# Patient Record
Sex: Female | Born: 1945
Health system: Southern US, Community
[De-identification: ages and names within clinical notes are randomized; demographics above are authoritative.]

## PROBLEM LIST (undated history)

## (undated) DIAGNOSIS — G20 Parkinson's disease: Secondary | ICD-10-CM

## (undated) DIAGNOSIS — M1990 Unspecified osteoarthritis, unspecified site: Secondary | ICD-10-CM

## (undated) DIAGNOSIS — G20A1 Parkinson's disease without dyskinesia, without mention of fluctuations: Secondary | ICD-10-CM

## (undated) DIAGNOSIS — I10 Essential (primary) hypertension: Secondary | ICD-10-CM

## (undated) DIAGNOSIS — C801 Malignant (primary) neoplasm, unspecified: Secondary | ICD-10-CM

## (undated) HISTORY — DX: Parkinson's disease: G20

## (undated) HISTORY — PX: OVARIAN CYST REMOVAL: SHX89

## (undated) HISTORY — DX: Parkinson's disease without dyskinesia, without mention of fluctuations: G20.A1

## (undated) HISTORY — DX: Essential (primary) hypertension: I10

## (undated) HISTORY — PX: OTHER SURGICAL HISTORY: SHX169

## (undated) HISTORY — DX: Unspecified osteoarthritis, unspecified site: M19.90

## (undated) HISTORY — DX: Malignant (primary) neoplasm, unspecified: C80.1

## (undated) HISTORY — PX: ABDOMINAL HYSTERECTOMY: SHX81

## (undated) HISTORY — PX: FOOT SURGERY: SHX648

---

## 2015-09-18 DIAGNOSIS — Z79899 Other long term (current) drug therapy: Secondary | ICD-10-CM | POA: Diagnosis not present

## 2015-10-26 DIAGNOSIS — M1711 Unilateral primary osteoarthritis, right knee: Secondary | ICD-10-CM | POA: Diagnosis not present

## 2015-10-26 DIAGNOSIS — Z09 Encounter for follow-up examination after completed treatment for conditions other than malignant neoplasm: Secondary | ICD-10-CM | POA: Diagnosis not present

## 2015-10-26 DIAGNOSIS — M0579 Rheumatoid arthritis with rheumatoid factor of multiple sites without organ or systems involvement: Secondary | ICD-10-CM | POA: Diagnosis not present

## 2015-11-10 DIAGNOSIS — H524 Presbyopia: Secondary | ICD-10-CM | POA: Diagnosis not present

## 2015-11-10 DIAGNOSIS — Z79899 Other long term (current) drug therapy: Secondary | ICD-10-CM | POA: Diagnosis not present

## 2015-11-10 DIAGNOSIS — H5203 Hypermetropia, bilateral: Secondary | ICD-10-CM | POA: Diagnosis not present

## 2015-11-10 DIAGNOSIS — H52223 Regular astigmatism, bilateral: Secondary | ICD-10-CM | POA: Diagnosis not present

## 2015-11-10 DIAGNOSIS — H25813 Combined forms of age-related cataract, bilateral: Secondary | ICD-10-CM | POA: Diagnosis not present

## 2015-11-17 HISTORY — PX: SKIN CANCER EXCISION: SHX779

## 2015-11-23 DIAGNOSIS — H6123 Impacted cerumen, bilateral: Secondary | ICD-10-CM | POA: Diagnosis not present

## 2015-11-23 DIAGNOSIS — E785 Hyperlipidemia, unspecified: Secondary | ICD-10-CM | POA: Diagnosis not present

## 2015-11-23 DIAGNOSIS — Z780 Asymptomatic menopausal state: Secondary | ICD-10-CM | POA: Diagnosis not present

## 2015-11-23 DIAGNOSIS — Z1389 Encounter for screening for other disorder: Secondary | ICD-10-CM | POA: Diagnosis not present

## 2015-11-23 DIAGNOSIS — Z1231 Encounter for screening mammogram for malignant neoplasm of breast: Secondary | ICD-10-CM | POA: Diagnosis not present

## 2015-11-23 DIAGNOSIS — Z9181 History of falling: Secondary | ICD-10-CM | POA: Diagnosis not present

## 2015-11-23 DIAGNOSIS — Z0000 Encounter for general adult medical examination without abnormal findings: Secondary | ICD-10-CM | POA: Diagnosis not present

## 2015-11-23 DIAGNOSIS — Z79899 Other long term (current) drug therapy: Secondary | ICD-10-CM | POA: Diagnosis not present

## 2015-11-23 DIAGNOSIS — Z1211 Encounter for screening for malignant neoplasm of colon: Secondary | ICD-10-CM | POA: Diagnosis not present

## 2015-11-23 DIAGNOSIS — C44612 Basal cell carcinoma of skin of right upper limb, including shoulder: Secondary | ICD-10-CM | POA: Diagnosis not present

## 2015-12-07 DIAGNOSIS — M05761 Rheumatoid arthritis with rheumatoid factor of right knee without organ or systems involvement: Secondary | ICD-10-CM | POA: Diagnosis not present

## 2015-12-07 DIAGNOSIS — R251 Tremor, unspecified: Secondary | ICD-10-CM | POA: Diagnosis not present

## 2015-12-07 DIAGNOSIS — E785 Hyperlipidemia, unspecified: Secondary | ICD-10-CM | POA: Diagnosis not present

## 2015-12-07 DIAGNOSIS — N182 Chronic kidney disease, stage 2 (mild): Secondary | ICD-10-CM | POA: Diagnosis not present

## 2015-12-10 DIAGNOSIS — G20 Parkinson's disease: Secondary | ICD-10-CM | POA: Diagnosis not present

## 2015-12-14 DIAGNOSIS — Z79899 Other long term (current) drug therapy: Secondary | ICD-10-CM | POA: Diagnosis not present

## 2015-12-15 DIAGNOSIS — C44612 Basal cell carcinoma of skin of right upper limb, including shoulder: Secondary | ICD-10-CM | POA: Diagnosis not present

## 2015-12-18 DIAGNOSIS — M85861 Other specified disorders of bone density and structure, right lower leg: Secondary | ICD-10-CM | POA: Diagnosis not present

## 2015-12-18 DIAGNOSIS — Z780 Asymptomatic menopausal state: Secondary | ICD-10-CM | POA: Diagnosis not present

## 2015-12-18 DIAGNOSIS — Z1231 Encounter for screening mammogram for malignant neoplasm of breast: Secondary | ICD-10-CM | POA: Diagnosis not present

## 2015-12-18 DIAGNOSIS — M8580 Other specified disorders of bone density and structure, unspecified site: Secondary | ICD-10-CM | POA: Diagnosis not present

## 2016-03-07 ENCOUNTER — Other Ambulatory Visit: Payer: Self-pay | Admitting: Rheumatology

## 2016-03-07 DIAGNOSIS — Z79899 Other long term (current) drug therapy: Secondary | ICD-10-CM | POA: Diagnosis not present

## 2016-03-08 LAB — CBC WITH DIFFERENTIAL/PLATELET
Basophils Absolute: 0.1 10*3/uL (ref 0.0–0.2)
Basos: 1 %
EOS (ABSOLUTE): 0.1 10*3/uL (ref 0.0–0.4)
EOS: 2 %
HEMATOCRIT: 39.3 % (ref 34.0–46.6)
HEMOGLOBIN: 13.3 g/dL (ref 11.1–15.9)
IMMATURE GRANULOCYTES: 0 %
Immature Grans (Abs): 0 10*3/uL (ref 0.0–0.1)
LYMPHS: 30 %
Lymphocytes Absolute: 2.3 10*3/uL (ref 0.7–3.1)
MCH: 28.5 pg (ref 26.6–33.0)
MCHC: 33.8 g/dL (ref 31.5–35.7)
MCV: 84 fL (ref 79–97)
MONOCYTES: 8 %
Monocytes Absolute: 0.6 10*3/uL (ref 0.1–0.9)
NEUTROS PCT: 59 %
Neutrophils Absolute: 4.6 10*3/uL (ref 1.4–7.0)
Platelets: 248 10*3/uL (ref 150–379)
RBC: 4.67 x10E6/uL (ref 3.77–5.28)
RDW: 13.8 % (ref 12.3–15.4)
WBC: 7.7 10*3/uL (ref 3.4–10.8)

## 2016-03-08 LAB — COMPREHENSIVE METABOLIC PANEL
ALBUMIN: 4 g/dL (ref 3.6–4.8)
ALT: 21 IU/L (ref 0–32)
AST: 27 IU/L (ref 0–40)
Albumin/Globulin Ratio: 1.8 (ref 1.2–2.2)
Alkaline Phosphatase: 97 IU/L (ref 39–117)
BUN / CREAT RATIO: 14 (ref 12–28)
BUN: 10 mg/dL (ref 8–27)
Bilirubin Total: 0.6 mg/dL (ref 0.0–1.2)
CALCIUM: 9.6 mg/dL (ref 8.7–10.3)
CO2: 26 mmol/L (ref 18–29)
CREATININE: 0.72 mg/dL (ref 0.57–1.00)
Chloride: 98 mmol/L (ref 96–106)
GFR calc Af Amer: 99 mL/min/{1.73_m2} (ref >59)
GFR, EST NON AFRICAN AMERICAN: 86 mL/min/{1.73_m2} (ref >59)
GLOBULIN, TOTAL: 2.2 g/dL (ref 1.5–4.5)
Glucose: 97 mg/dL (ref 65–99)
Potassium: 4.6 mmol/L (ref 3.5–5.2)
SODIUM: 138 mmol/L (ref 134–144)
Total Protein: 6.2 g/dL (ref 6.0–8.5)

## 2016-03-09 ENCOUNTER — Telehealth: Payer: Self-pay | Admitting: Radiology

## 2016-03-09 NOTE — Telephone Encounter (Signed)
Call patient/ normal labs

## 2016-03-09 NOTE — Telephone Encounter (Signed)
I have called patient to advise labs are normal  

## 2016-03-17 DIAGNOSIS — R0683 Snoring: Secondary | ICD-10-CM | POA: Diagnosis not present

## 2016-03-17 DIAGNOSIS — G20 Parkinson's disease: Secondary | ICD-10-CM | POA: Diagnosis not present

## 2016-03-24 DIAGNOSIS — M19071 Primary osteoarthritis, right ankle and foot: Secondary | ICD-10-CM | POA: Insufficient documentation

## 2016-03-24 DIAGNOSIS — Z79899 Other long term (current) drug therapy: Secondary | ICD-10-CM | POA: Insufficient documentation

## 2016-03-24 DIAGNOSIS — M19042 Primary osteoarthritis, left hand: Secondary | ICD-10-CM | POA: Insufficient documentation

## 2016-03-24 DIAGNOSIS — M19072 Primary osteoarthritis, left ankle and foot: Secondary | ICD-10-CM

## 2016-03-24 DIAGNOSIS — M170 Bilateral primary osteoarthritis of knee: Secondary | ICD-10-CM | POA: Insufficient documentation

## 2016-03-24 DIAGNOSIS — M19041 Primary osteoarthritis, right hand: Secondary | ICD-10-CM | POA: Insufficient documentation

## 2016-03-24 DIAGNOSIS — M0579 Rheumatoid arthritis with rheumatoid factor of multiple sites without organ or systems involvement: Secondary | ICD-10-CM | POA: Insufficient documentation

## 2016-03-24 DIAGNOSIS — R251 Tremor, unspecified: Secondary | ICD-10-CM | POA: Insufficient documentation

## 2016-03-24 NOTE — Progress Notes (Signed)
Office Visit Note  Patient: Dawn Harrell             Date of Birth: [birth year]           MRN: [id number]             PCP: Charletta Cousin, MD Referring: No ref. provider found Visit Date: 03/28/2016 Occupation: @GUAROCC @    Subjective:  No chief complaint on file. Follow-up on rheumatoid arthritis and Plaquenil  History of Present Illness: Dawn Harrell is a 70 y.o. female  Last seen 10/26/2015. Patient is doing really well with her rheumatoid arthritis. She's taking her Plaquenil as prescribed at 200 mg 3 times a day Monday through Friday. Her last Plaquenil eye exam was normal approximately July 2017 in her next one will be due July 2018.  She had a rash on her right forearm close to her elbow area. She saw a dermatologist, Dr. Jimmye Norman, in [facility]. They did diagnose her with cancer of the skin but patient does not recall what type of cancer was. She states that it was not a melanoma. Large surgical excision was done.  Patient is doing well. The surgical scar is approximately 7-10 cm long. It has healed well. We do not have records of the exact diagnosis and the procedure that was done. I've asked the patient to get [place] a copy.  Patient has ongoing OA of the hands. Most painful is her left second DIP joint. It is angulated I offered her ring splint and patient is agreeable. I've written a prescription and she will follow-up with the physical therapist.  Activities of Daily Living:  Patient reports morning stiffness for 15 minutes.   Patient Denies nocturnal pain.  Difficulty dressing/grooming: Denies Difficulty climbing stairs: Denies Difficulty getting out of chair: Denies Difficulty using hands for taps, buttons, cutlery, and/or writing: Denies   No Rheumatology ROS completed.   PMFS History:  Patient Active Problem List   Diagnosis Date Noted  . Tremor 03/24/2016  . Rheumatoid arthritis involving multiple sites with positive rheumatoid factor  ([place]) 03/24/2016  . Primary osteoarthritis of both hands 03/24/2016  . Primary osteoarthritis of both knees 03/24/2016  . Primary osteoarthritis of both feet 03/24/2016  . High risk medication use 03/24/2016    Past Medical History:  Diagnosis Date  . Parkinson's disease ([place])     Family History  Problem Relation Age of Onset  . Stroke Mother   . Heart attack Father   . Rheum arthritis Sister   . Leukemia Brother   . Diabetes Maternal Grandmother   . Diabetes Paternal Grandmother    Past Surgical History:  Procedure Laterality Date  . SKIN CANCER EXCISION  11/2015   Social History   Social History Narrative  . No narrative on file     Objective: Vital Signs: BP 126/87 (BP Location: Left Arm, Patient Position: Sitting, Cuff Size: Large)   Pulse 74   Resp 14   Ht 5\' 1"  (1.549 m)   Wt 258 lb (117 kg)   BMI 48.75 kg/m    Physical Exam   Musculoskeletal Exam:  Full range of motion of all joints Grip strength is equal and strong bilaterally Fibromyalgia tender points are all absent  CDAI Exam: No CDAI exam completed.  No synovitis  Investigation: Findings:  Normal PLQ eye exam 11/10/15   Orders Only on 03/07/2016  Component Date Value Ref Range Status  . WBC 03/08/2016 7.7  3.4 - 10.8 x10E3/uL Final  . RBC  03/08/2016 4.67  3.77 - 5.28 x10E6/uL Final  . Hemoglobin 03/08/2016 13.3  11.1 - 15.9 g/dL Final   Comment: **Effective March 21, 2016 the reference interval**   for Hemoglobin MALES only will be changing to:                         Males 13-15 years: 12.6 - 17.7                         Males   >15 years: 13.0 - 17.7   . Hematocrit 03/08/2016 39.3  34.0 - 46.6 % Final  . MCV 03/08/2016 84  79 - 97 fL Final  . MCH 03/08/2016 28.5  26.6 - 33.0 pg Final  . MCHC 03/08/2016 33.8  31.5 - 35.7 g/dL Final  . RDW 03/08/2016 13.8  12.3 - 15.4 % Final  . Platelets 03/08/2016 248  150 - 379 x10E3/uL Final  . Neutrophils 03/08/2016 59  Not Estab. % Final  .  Lymphs 03/08/2016 30  Not Estab. % Final  . Monocytes 03/08/2016 8  Not Estab. % Final  . Eos 03/08/2016 2  Not Estab. % Final  . Basos 03/08/2016 1  Not Estab. % Final  . Neutrophils Absolute 03/08/2016 4.6  1.4 - 7.0 x10E3/uL Final  . Lymphocytes Absolute 03/08/2016 2.3  0.7 - 3.1 x10E3/uL Final  . Monocytes Absolute 03/08/2016 0.6  0.1 - 0.9 x10E3/uL Final  . EOS (ABSOLUTE) 03/08/2016 0.1  0.0 - 0.4 x10E3/uL Final  . Basophils Absolute 03/08/2016 0.1  0.0 - 0.2 x10E3/uL Final  . Immature Granulocytes 03/08/2016 0  Not Estab. % Final  . Immature Grans (Abs) 03/08/2016 0.0  0.0 - 0.1 x10E3/uL Final  . Glucose 03/08/2016 97  65 - 99 mg/dL Final  . BUN 03/08/2016 10  8 - 27 mg/dL Final  . Creatinine, Ser 03/08/2016 0.72  0.57 - 1.00 mg/dL Final  . GFR calc non Af Amer 03/08/2016 86  >59 mL/min/1.73 Final  . GFR calc Af Amer 03/08/2016 99  >59 mL/min/1.73 Final  . BUN/Creatinine Ratio 03/08/2016 14  12 - 28 Final  . Sodium 03/08/2016 138  134 - 144 mmol/L Final  . Potassium 03/08/2016 4.6  3.5 - 5.2 mmol/L Final  . Chloride 03/08/2016 98  96 - 106 mmol/L Final  . CO2 03/08/2016 26  18 - 29 mmol/L Final  . Calcium 03/08/2016 9.6  8.7 - 10.3 mg/dL Final  . Total Protein 03/08/2016 6.2  6.0 - 8.5 g/dL Final  . Albumin 03/08/2016 4.0  3.6 - 4.8 g/dL Final  . Globulin, Total 03/08/2016 2.2  1.5 - 4.5 g/dL Final  . Albumin/Globulin Ratio 03/08/2016 1.8  1.2 - 2.2 Final  . Bilirubin Total 03/08/2016 0.6  0.0 - 1.2 mg/dL Final  . Alkaline Phosphatase 03/08/2016 97  39 - 117 IU/L Final  . AST 03/08/2016 27  0 - 40 IU/L Final  . ALT 03/08/2016 21  0 - 32 IU/L Final    Imaging: No results found.  Speciality Comments: No specialty comments available.    Procedures:  No procedures performed Allergies: Patient has no known allergies.   Assessment / Plan:     Visit Diagnoses: Rheumatoid arthritis involving multiple sites with positive rheumatoid factor (HCC)  Primary osteoarthritis of  both hands  Primary osteoarthritis of both knees  Primary osteoarthritis of both feet  Tremor  High risk medication use - Plan: CBC with Differential/Platelet,  COMPLETE METABOLIC PANEL WITH GFR   [facility] for evaluation and treatment of left second DIP ring splint  prescription written and given to patient.  Labs from 03/07/2016 that include CBC with differential and CMP at with GFR are normal. Patient has enough Plaquenil at the moment. She just received a refill on her prescription for November 2017. It is okay to give her refill if she calls. She'll be due for Plaquenil eye exam approximately June July 2018. Return to clinic in 5 months and at that time we'll repeat her blood work.  Orders: Orders Placed This Encounter  Procedures  . CBC with Differential/Platelet  . COMPLETE METABOLIC PANEL WITH GFR   No orders of the defined types were placed in this encounter.   Face-to-face time spent with patient was 30 minutes. 50% of time was spent in counseling and coordination of care.  Follow-Up Instructions: Return in about 5 months (around 08/26/2016) for RA,plq 200 bid m-f, oahands,oa feet;.   Eliezer Lofts, PA-C I examined and evaluated the patient with Eliezer Lofts PA. The plan of care was discussed as noted above.  Bo Merino, MD

## 2016-03-28 ENCOUNTER — Ambulatory Visit (INDEPENDENT_AMBULATORY_CARE_PROVIDER_SITE_OTHER): Payer: Commercial Managed Care - HMO | Admitting: Rheumatology

## 2016-03-28 ENCOUNTER — Encounter: Payer: Self-pay | Admitting: Rheumatology

## 2016-03-28 VITALS — BP 126/87 | HR 74 | Resp 14 | Ht 61.0 in | Wt 258.0 lb

## 2016-03-28 DIAGNOSIS — R251 Tremor, unspecified: Secondary | ICD-10-CM | POA: Diagnosis not present

## 2016-03-28 DIAGNOSIS — M19071 Primary osteoarthritis, right ankle and foot: Secondary | ICD-10-CM

## 2016-03-28 DIAGNOSIS — M19042 Primary osteoarthritis, left hand: Secondary | ICD-10-CM | POA: Diagnosis not present

## 2016-03-28 DIAGNOSIS — M19041 Primary osteoarthritis, right hand: Secondary | ICD-10-CM | POA: Diagnosis not present

## 2016-03-28 DIAGNOSIS — M170 Bilateral primary osteoarthritis of knee: Secondary | ICD-10-CM

## 2016-03-28 DIAGNOSIS — M19072 Primary osteoarthritis, left ankle and foot: Secondary | ICD-10-CM | POA: Diagnosis not present

## 2016-03-28 DIAGNOSIS — M0579 Rheumatoid arthritis with rheumatoid factor of multiple sites without organ or systems involvement: Secondary | ICD-10-CM

## 2016-03-28 DIAGNOSIS — Z79899 Other long term (current) drug therapy: Secondary | ICD-10-CM

## 2016-03-28 NOTE — Patient Instructions (Signed)
CC hand surgeon/physical therapy for OA left second DIP angulation and ring splint

## 2016-04-07 DIAGNOSIS — R251 Tremor, unspecified: Secondary | ICD-10-CM | POA: Diagnosis not present

## 2016-04-07 DIAGNOSIS — K644 Residual hemorrhoidal skin tags: Secondary | ICD-10-CM | POA: Diagnosis not present

## 2016-04-07 DIAGNOSIS — E785 Hyperlipidemia, unspecified: Secondary | ICD-10-CM | POA: Diagnosis not present

## 2016-04-07 DIAGNOSIS — M05761 Rheumatoid arthritis with rheumatoid factor of right knee without organ or systems involvement: Secondary | ICD-10-CM | POA: Diagnosis not present

## 2016-04-07 DIAGNOSIS — Z79899 Other long term (current) drug therapy: Secondary | ICD-10-CM | POA: Diagnosis not present

## 2016-04-26 DIAGNOSIS — L570 Actinic keratosis: Secondary | ICD-10-CM | POA: Diagnosis not present

## 2016-08-26 ENCOUNTER — Ambulatory Visit (INDEPENDENT_AMBULATORY_CARE_PROVIDER_SITE_OTHER): Payer: Commercial Managed Care - HMO | Admitting: Rheumatology

## 2016-08-26 ENCOUNTER — Encounter: Payer: Self-pay | Admitting: Rheumatology

## 2016-08-26 VITALS — BP 130/80 | HR 74 | Resp 16 | Ht 61.0 in | Wt 154.0 lb

## 2016-08-26 DIAGNOSIS — M19041 Primary osteoarthritis, right hand: Secondary | ICD-10-CM | POA: Diagnosis not present

## 2016-08-26 DIAGNOSIS — M25561 Pain in right knee: Secondary | ICD-10-CM | POA: Diagnosis not present

## 2016-08-26 DIAGNOSIS — G8929 Other chronic pain: Secondary | ICD-10-CM

## 2016-08-26 DIAGNOSIS — M19042 Primary osteoarthritis, left hand: Secondary | ICD-10-CM

## 2016-08-26 DIAGNOSIS — Z79899 Other long term (current) drug therapy: Secondary | ICD-10-CM | POA: Diagnosis not present

## 2016-08-26 DIAGNOSIS — M170 Bilateral primary osteoarthritis of knee: Secondary | ICD-10-CM | POA: Diagnosis not present

## 2016-08-26 DIAGNOSIS — M0579 Rheumatoid arthritis with rheumatoid factor of multiple sites without organ or systems involvement: Secondary | ICD-10-CM

## 2016-08-26 MED ORDER — TRIAMCINOLONE ACETONIDE 40 MG/ML IJ SUSP
40.0000 mg | INTRAMUSCULAR | Status: AC | PRN
  Administered 2016-08-26: 40 mg via INTRA_ARTICULAR

## 2016-08-26 MED ORDER — LIDOCAINE HCL 1 % IJ SOLN
1.0000 mL | INTRAMUSCULAR | Status: AC | PRN
  Administered 2016-08-26: 1 mL

## 2016-08-26 NOTE — Patient Instructions (Signed)
  Plaquenil eye exam due July 2018 please fax results to [phone]

## 2016-08-26 NOTE — Progress Notes (Signed)
Office Visit Note  Patient: Dawn Harrell             Date of Birth: February 11, 1946           MRN: [id number]             PCP: Melony Overly, MD Referring: Melony Overly, MD Visit Date: 08/26/2016 Occupation: @GUAROCC @    Subjective:  Medication Management   History of Present Illness: Dawn Harrell is a 71 y.o. female  Last seen 03/28/2016  Patient is doing really well with her rheumatoid arthritis. She's taking her Plaquenil as prescribed at 200 mg twice a day Monday through Friday. Her last Plaquenil eye exam was normal approximately July 2017 in her next one will be due July 2018.   Patient has OA of the hands. Most painful is her left second DIP joint. It is angulated I offered her ring splint and patient is agreeable. I've written a prescription and she will follow-up with the physical therapist.   Activities of Daily Living:  Patient reports morning stiffness for 15 minutes.   Patient Denies nocturnal pain.  Difficulty dressing/grooming: Denies Difficulty climbing stairs: Denies Difficulty getting out of chair: Denies Difficulty using hands for taps, buttons, cutlery, and/or writing: Denies   Review of Systems  Constitutional: Negative for fatigue.  HENT: Negative for mouth sores and mouth dryness.   Eyes: Negative for dryness.  Respiratory: Negative for shortness of breath.   Gastrointestinal: Negative for constipation and diarrhea.  Musculoskeletal: Negative for myalgias and myalgias.  Skin: Negative for sensitivity to sunlight.  Psychiatric/Behavioral: Negative for decreased concentration and sleep disturbance.    PMFS History:  Patient Active Problem List   Diagnosis Date Noted  . Tremor 03/24/2016  . Rheumatoid arthritis involving multiple sites with positive rheumatoid factor ([place]) 03/24/2016  . Primary osteoarthritis of both hands 03/24/2016  . Primary osteoarthritis of both knees 03/24/2016  . Primary osteoarthritis of both feet 03/24/2016   . High risk medication use 03/24/2016    Past Medical History:  Diagnosis Date  . Parkinson's disease ([place])   . skin cancer     Family History  Problem Relation Age of Onset  . Stroke Mother   . Heart attack Father   . Rheum arthritis Sister   . Leukemia Brother   . Diabetes Maternal Grandmother   . Diabetes Paternal Grandmother    Past Surgical History:  Procedure Laterality Date  . ABDOMINAL HYSTERECTOMY    . bladder tack    . SKIN CANCER EXCISION  11/2015   Social History   Social History Narrative  . No narrative on file     Objective: Vital Signs: BP 130/80   Pulse 74   Resp 16   Ht 5' 1"  (1.549 m)   Wt 154 lb (69.9 kg)   BMI 29.10 kg/m    Physical Exam  Constitutional: She is oriented to person, place, and time. She appears well-developed and well-nourished.  HENT:  Head: Normocephalic and atraumatic.  Eyes: EOM are normal. Pupils are equal, round, and reactive to light.  Cardiovascular: Normal rate, regular rhythm and normal heart sounds.  Exam reveals no gallop and no friction rub.   No murmur heard. Pulmonary/Chest: Effort normal and breath sounds normal. She has no wheezes. She has no rales.  Abdominal: Soft. Bowel sounds are normal. She exhibits no distension. There is no tenderness. There is no guarding. No hernia.  Musculoskeletal: Normal range of motion. She exhibits no edema, tenderness or deformity.  Lymphadenopathy:    She has no cervical adenopathy.  Neurological: She is alert and oriented to person, place, and time. Coordination normal.  Skin: Skin is warm and dry. Capillary refill takes less than 2 seconds. No rash noted.  Psychiatric: She has a normal mood and affect. Her behavior is normal.  Nursing note and vitals reviewed.    Musculoskeletal Exam:  Full range of motion of all joints Grip strength is equal and strong bilaterally Fibromyalgia tender points are all absent  CDAI Exam: CDAI Homunculus Exam:   Joint Counts:  CDAI  Tender Joint count: 0 CDAI Swollen Joint count: 0   No synovitis on examination  Investigation: No additional findings.    Imaging: No results found.  Speciality Comments: No specialty comments available.    Procedures:  Large Joint Inj Date/Time: 08/26/2016 12:18 PM Performed by: Eliezer Lofts Authorized by: Eliezer Lofts   Consent Given by:  Patient Site marked: the procedure site was marked   Timeout: prior to procedure the correct patient, procedure, and site was verified   Indications:  Pain and joint swelling Location:  Knee Site:  R knee Prep: patient was prepped and draped in usual sterile fashion   Needle Size:  27 G Needle Length:  1.5 inches Approach:  Medial Ultrasound Guidance: No   Fluoroscopic Guidance: No   Arthrogram: No   Medications:  40 mg triamcinolone acetonide 40 MG/ML; 1 mL lidocaine 1 % Aspiration Attempted: Yes   Aspirate amount (mL):  0 Patient tolerance:  Patient tolerated the procedure well with no immediate complications  Right knee with cortisone injection Patient had an injection more than a year ago. Patient is interested in Visco supplementation. She has failed weight loss, she has failed NSAID's (cannot take; failed Tylenol); failed cortisone and x-rays are proven that she has osteoarthritis   Allergies: Gabapentin   Assessment / Plan:     Visit Diagnoses: Rheumatoid arthritis involving multiple sites with positive rheumatoid factor ([place])  High risk medication use - Plan: CBC with Differential/Platelet, COMPLETE METABOLIC PANEL WITH GFR, CBC with Differential/Platelet, CMP14+EGFR, CBC with Differential/Platelet, CMP14+EGFR  Primary osteoarthritis of both knees  Primary osteoarthritis of both hands   Plan: #1: Rheumatoid arthritis. Positive rheumatoid factor. No joint pain, swelling, stiffness.  #2: High risk prescription. Plaquenil 200 mg twice a day Monday through Friday; Plaquenil eye exam was normal July 2017 and  due July 2018  #3: OA of bilateral knees  #4: Right knee pain. Patient is requesting a repeat cortisone injection. She states the last cortisone injection helped. It was given approximately more than a year ago.  We discussed Visco supplementation and patient is agreeable.  #5: I will send the following message to apply for Visco supplementation for bilateral knees.  #6: CBC with differential and CMP with GFR to be done at lab core in about 2 weeks or later. We wanted to draw in the office today but patient has poison ivy exposure to bilateral arms and it would be wise not to draw the blood today due to the risk of transferring and from the skin into the venipuncture site.  Orders: Orders Placed This Encounter  Procedures  . Large Joint Injection/Arthrocentesis  . CBC with Differential/Platelet  . COMPLETE METABOLIC PANEL WITH GFR  . CBC with Differential/Platelet  . CMP14+EGFR   No orders of the defined types were placed in this encounter.   Face-to-face time spent with patient was 30 minutes. 50% of time was spent in counseling and coordination  of care.  Follow-Up Instructions: No Follow-up on file.   Eliezer Lofts, PA-C  Note - This record has been created using [employer].  Chart creation errors have been sought, but may not always  have been located. Such creation errors do not reflect on  the standard of medical care.

## 2016-09-15 DIAGNOSIS — G20 Parkinson's disease: Secondary | ICD-10-CM | POA: Diagnosis not present

## 2016-09-19 DIAGNOSIS — Z79899 Other long term (current) drug therapy: Secondary | ICD-10-CM | POA: Diagnosis not present

## 2016-09-20 LAB — CMP14+EGFR
A/G RATIO: 2 (ref 1.2–2.2)
ALBUMIN: 4.2 g/dL (ref 3.5–4.8)
ALT: 24 IU/L (ref 0–32)
AST: 22 IU/L (ref 0–40)
Alkaline Phosphatase: 87 IU/L (ref 39–117)
BUN/Creatinine Ratio: 15 (ref 12–28)
BUN: 12 mg/dL (ref 8–27)
Bilirubin Total: 0.8 mg/dL (ref 0.0–1.2)
CO2: 26 mmol/L (ref 18–29)
Calcium: 9.4 mg/dL (ref 8.7–10.3)
Chloride: 95 mmol/L — ABNORMAL LOW (ref 96–106)
Creatinine, Ser: 0.79 mg/dL (ref 0.57–1.00)
GFR, EST AFRICAN AMERICAN: 88 mL/min/{1.73_m2} (ref >59)
GFR, EST NON AFRICAN AMERICAN: 76 mL/min/{1.73_m2} (ref >59)
Globulin, Total: 2.1 g/dL (ref 1.5–4.5)
Glucose: 92 mg/dL (ref 65–99)
POTASSIUM: 4 mmol/L (ref 3.5–5.2)
Sodium: 135 mmol/L (ref 134–144)
TOTAL PROTEIN: 6.3 g/dL (ref 6.0–8.5)

## 2016-09-20 LAB — CBC WITH DIFFERENTIAL/PLATELET
BASOS: 1 %
Basophils Absolute: 0.1 10*3/uL (ref 0.0–0.2)
EOS (ABSOLUTE): 0.1 10*3/uL (ref 0.0–0.4)
EOS: 1 %
HEMATOCRIT: 40.5 % (ref 34.0–46.6)
HEMOGLOBIN: 13.8 g/dL (ref 11.1–15.9)
IMMATURE GRANS (ABS): 0 10*3/uL (ref 0.0–0.1)
IMMATURE GRANULOCYTES: 0 %
LYMPHS: 27 %
Lymphocytes Absolute: 2.1 10*3/uL (ref 0.7–3.1)
MCH: 28.1 pg (ref 26.6–33.0)
MCHC: 34.1 g/dL (ref 31.5–35.7)
MCV: 83 fL (ref 79–97)
MONOCYTES: 8 %
Monocytes Absolute: 0.6 10*3/uL (ref 0.1–0.9)
NEUTROS ABS: 4.8 10*3/uL (ref 1.4–7.0)
NEUTROS PCT: 63 %
PLATELETS: 234 10*3/uL (ref 150–379)
RBC: 4.91 x10E6/uL (ref 3.77–5.28)
RDW: 13.6 % (ref 12.3–15.4)
WBC: 7.6 10*3/uL (ref 3.4–10.8)

## 2016-09-21 DIAGNOSIS — M7632 Iliotibial band syndrome, left leg: Secondary | ICD-10-CM | POA: Diagnosis not present

## 2016-10-04 DIAGNOSIS — Z8673 Personal history of transient ischemic attack (TIA), and cerebral infarction without residual deficits: Secondary | ICD-10-CM | POA: Insufficient documentation

## 2016-10-04 DIAGNOSIS — Z8669 Personal history of other diseases of the nervous system and sense organs: Secondary | ICD-10-CM | POA: Insufficient documentation

## 2016-10-04 DIAGNOSIS — Z8639 Personal history of other endocrine, nutritional and metabolic disease: Secondary | ICD-10-CM | POA: Insufficient documentation

## 2016-10-04 DIAGNOSIS — M2242 Chondromalacia patellae, left knee: Secondary | ICD-10-CM | POA: Insufficient documentation

## 2016-10-04 DIAGNOSIS — M2241 Chondromalacia patellae, right knee: Secondary | ICD-10-CM | POA: Insufficient documentation

## 2016-10-04 NOTE — Progress Notes (Signed)
Office Visit Note  Patient: Dawn Harrell             Date of Birth: Jan 29, 1946           MRN: [id number]             PCP: Melony Overly, MD Referring: Melony Overly, MD Visit Date: 10/05/2016 Occupation: @GUAROCC @    Subjective:  Pain of the Right Knee and Pain of the Left Knee   History of Present Illness: Ethylene Reznick is a 71 y.o. female with history of sero positive rheumatoid arthritis. She states she's continues to have pain and discomfort in her bilateral knee joints. Her left knee joint is quite painful. She had a cortisone injection to her left knee joint in May 2018. She did not have much relief from that. She notices some swelling in her left knee. She does not have much discomfort in her hands and feet.  Activities of Daily Living:  Patient reports morning stiffness for 0 minutes.   Patient Denies nocturnal pain.  Difficulty dressing/grooming: Denies Difficulty climbing stairs: Reports Difficulty getting out of chair: Reports Difficulty using hands for taps, buttons, cutlery, and/or writing: Denies   Review of Systems  Constitutional: Positive for fatigue. Negative for night sweats, weight gain, weight loss and weakness.  HENT: Negative for mouth sores, trouble swallowing, trouble swallowing, mouth dryness and nose dryness.   Eyes: Negative for pain, redness, visual disturbance and dryness.  Respiratory: Negative for cough, shortness of breath and difficulty breathing.   Cardiovascular: Negative for chest pain, palpitations, hypertension, irregular heartbeat and swelling in legs/feet.  Gastrointestinal: Negative for blood in stool, constipation and diarrhea.  Endocrine: Negative for increased urination.  Genitourinary: Negative for vaginal dryness.  Musculoskeletal: Positive for arthralgias, joint pain and morning stiffness. Negative for joint swelling, myalgias, muscle weakness, muscle tenderness and myalgias.  Skin: Negative for color change, rash, hair  loss, skin tightness, ulcers and sensitivity to sunlight.  Allergic/Immunologic: Negative for susceptible to infections.  Neurological: Negative for dizziness, memory loss and night sweats.  Hematological: Negative for swollen glands.  Psychiatric/Behavioral: Negative for depressed mood and sleep disturbance. The patient is not nervous/anxious.     PMFS History:  Patient Active Problem List   Diagnosis Date Noted  . Chondromalacia of both patellae 10/04/2016  . History of tremor/ Parkinsons dx.  10/04/2016  . History of TIA (transient ischemic attack) 10/04/2016  . History of hyperlipidemia 10/04/2016  . Tremor 03/24/2016  . Rheumatoid arthritis involving multiple sites with positive rheumatoid factor ([place]) 03/24/2016  . Primary osteoarthritis of both hands 03/24/2016  . Primary osteoarthritis of both knees 03/24/2016  . Primary osteoarthritis of both feet 03/24/2016  . High risk medication use 03/24/2016    Past Medical History:  Diagnosis Date  . Parkinson's disease ([place])   . skin cancer     Family History  Problem Relation Age of Onset  . Stroke Mother   . Heart attack Father   . Rheum arthritis Sister   . Leukemia Brother   . Diabetes Maternal Grandmother   . Diabetes Paternal Grandmother    Past Surgical History:  Procedure Laterality Date  . ABDOMINAL HYSTERECTOMY    . bladder tack    . SKIN CANCER EXCISION  11/2015   Social History   Social History Narrative  . No narrative on file     Objective: Vital Signs: BP 124/74   Pulse 78   Resp 16   Wt 158 lb (71.7 kg)  BMI 29.85 kg/m    Physical Exam  Constitutional: She is oriented to person, place, and time. She appears well-developed and well-nourished.  HENT:  Head: Normocephalic and atraumatic.  Eyes: Conjunctivae and EOM are normal.  Neck: Normal range of motion.  Cardiovascular: Normal rate, regular rhythm, normal heart sounds and intact distal pulses.   Pulmonary/Chest: Effort normal and breath  sounds normal.  Abdominal: Soft. Bowel sounds are normal.  Lymphadenopathy:    She has no cervical adenopathy.  Neurological: She is alert and oriented to person, place, and time.  Skin: Skin is warm and dry. Capillary refill takes less than 2 seconds.  Psychiatric: She has a normal mood and affect. Her behavior is normal.  Nursing note and vitals reviewed.    Musculoskeletal Exam: C-spine and thoracic lumbar spine good range of motion. Shoulder joints elbow joints, MCPs PIPs DIPs were good range of motion. She has some DIP PIP thickening consistent with osteoarthritis no synovitis was noted. Hip joints knee joints ankles MTPs PIPs DIPs with good range of motion with no synovitis. She has tenderness on palpation over her left trochanteric area consistent with trochanteric bursitis.  CDAI Exam: CDAI Homunculus Exam:   Tenderness:  RLE: tibiofemoral LLE: tibiofemoral  Joint Counts:  CDAI Tender Joint count: 2 CDAI Swollen Joint count: 0  Global Assessments:  Patient Global Assessment: 2 Provider Global Assessment: 2  CDAI Calculated Score: 6    Investigation: Findings:  11/10/2015 normal PLQ eye exam   10/26/2015  X-ray of her right knee joint 3 views showed moderate medial compartment narrowing and severe patellofemoral narrowing consistent with chondromalacia patella and moderate osteoarthritis.   CBC Latest Ref Rng & Units 09/19/2016 03/07/2016  WBC 3.4 - 10.8 x10E3/uL 7.6 7.7  Hemoglobin 11.1 - 15.9 g/dL 13.8 13.3  Hematocrit 34.0 - 46.6 % 40.5 39.3  Platelets 150 - 379 x10E3/uL 234 248    CMP Latest Ref Rng & Units 09/19/2016 03/07/2016  Glucose 65 - 99 mg/dL 92 97  BUN 8 - 27 mg/dL 12 10  Creatinine 0.57 - 1.00 mg/dL 0.79 0.72  Sodium 134 - 144 mmol/L 135 138  Potassium 3.5 - 5.2 mmol/L 4.0 4.6  Chloride 96 - 106 mmol/L 95(L) 98  CO2 18 - 29 mmol/L 26 26  Calcium 8.7 - 10.3 mg/dL 9.4 9.6  Total Protein 6.0 - 8.5 g/dL 6.3 6.2  Total Bilirubin 0.0 - 1.2 mg/dL 0.8 0.6   Alkaline Phos 39 - 117 IU/L 87 97  AST 0 - 40 IU/L 22 27  ALT 0 - 32 IU/L 24 21    Imaging: No results found.  Speciality Comments: No specialty comments available.    Procedures:  Large Joint Inj Date/Time: 10/05/2016 9:18 AM Performed by: Bo Merino Authorized by: Bo Merino   Consent Given by:  Patient Site marked: the procedure site was marked   Timeout: prior to procedure the correct patient, procedure, and site was verified   Indications:  Pain Location:  Hip Site:  L greater trochanter Prep: patient was prepped and draped in usual sterile fashion   Needle Size:  27 G Needle Length:  1.5 inches Approach:  Lateral Ultrasound Guidance: No   Fluoroscopic Guidance: No   Arthrogram: No   Medications:  40 mg triamcinolone acetonide 40 MG/ML; 1.5 mL lidocaine 1 % Aspiration Attempted: No   Aspirate amount (mL):  0 Patient tolerance:  Patient tolerated the procedure well with no immediate complications   Allergies: Gabapentin   Assessment / Plan:  Visit Diagnoses: Rheumatoid arthritis involving multiple sites with positive rheumatoid factor ([place]): Her rheumatoid arthritis seems to be very well controlled she has no synovitis on examination.  High risk medication use - PLQ 200 mg by mouth twice a day and it to Friday. Her labs are stable. Her eye exam was in July 2017.  Trochanteric bursitis of left hip: Deferred treatment options and their side effects were discussed after informed consent was obtained area was prepped and strong fashion with cortisone as described above.  Primary osteoarthritis of both hands: Joint protection and muscle strengthening was discussed.  Primary osteoarthritis of both knees: She complains of knee joint discomfort but I believe the discomfort is coming from her left trochanteric area. She's been advised to monitor blood pressure closely after the injection.  Chondromalacia of both patellae  Primary osteoarthritis of both  feet: Proper fitting shoes were discussed.  History of tremor/ Parkinsons dx.   History of TIA (transient ischemic attack)  History of hyperlipidemia    Orders: Orders Placed This Encounter  Procedures  . Large Joint Injection/Arthrocentesis   No orders of the defined types were placed in this encounter.   Face-to-face time spent with patient was 30 minutes. 50% of time was spent in counseling and coordination of care.  Follow-Up Instructions: Return in about 5 months (around 03/07/2017) for Rheumatoid arthritis.   Bo Merino, MD  Note - This record has been created using [employer].  Chart creation errors have been sought, but may not always  have been located. Such creation errors do not reflect on  the standard of medical care.

## 2016-10-05 ENCOUNTER — Encounter: Payer: Self-pay | Admitting: Rheumatology

## 2016-10-05 ENCOUNTER — Ambulatory Visit (INDEPENDENT_AMBULATORY_CARE_PROVIDER_SITE_OTHER): Payer: Commercial Managed Care - HMO | Admitting: Rheumatology

## 2016-10-05 VITALS — BP 124/74 | HR 78 | Resp 16 | Wt 158.0 lb

## 2016-10-05 DIAGNOSIS — M2242 Chondromalacia patellae, left knee: Secondary | ICD-10-CM | POA: Diagnosis not present

## 2016-10-05 DIAGNOSIS — M170 Bilateral primary osteoarthritis of knee: Secondary | ICD-10-CM

## 2016-10-05 DIAGNOSIS — M19042 Primary osteoarthritis, left hand: Secondary | ICD-10-CM

## 2016-10-05 DIAGNOSIS — G250 Essential tremor: Secondary | ICD-10-CM | POA: Diagnosis not present

## 2016-10-05 DIAGNOSIS — Z8669 Personal history of other diseases of the nervous system and sense organs: Secondary | ICD-10-CM

## 2016-10-05 DIAGNOSIS — M0579 Rheumatoid arthritis with rheumatoid factor of multiple sites without organ or systems involvement: Secondary | ICD-10-CM | POA: Diagnosis not present

## 2016-10-05 DIAGNOSIS — M2241 Chondromalacia patellae, right knee: Secondary | ICD-10-CM | POA: Diagnosis not present

## 2016-10-05 DIAGNOSIS — M19071 Primary osteoarthritis, right ankle and foot: Secondary | ICD-10-CM | POA: Diagnosis not present

## 2016-10-05 DIAGNOSIS — M19072 Primary osteoarthritis, left ankle and foot: Secondary | ICD-10-CM

## 2016-10-05 DIAGNOSIS — Z8673 Personal history of transient ischemic attack (TIA), and cerebral infarction without residual deficits: Secondary | ICD-10-CM

## 2016-10-05 DIAGNOSIS — M7062 Trochanteric bursitis, left hip: Secondary | ICD-10-CM | POA: Diagnosis not present

## 2016-10-05 DIAGNOSIS — Z8639 Personal history of other endocrine, nutritional and metabolic disease: Secondary | ICD-10-CM

## 2016-10-05 DIAGNOSIS — Z79899 Other long term (current) drug therapy: Secondary | ICD-10-CM | POA: Diagnosis not present

## 2016-10-05 DIAGNOSIS — M19041 Primary osteoarthritis, right hand: Secondary | ICD-10-CM

## 2016-10-05 MED ORDER — TRIAMCINOLONE ACETONIDE 40 MG/ML IJ SUSP
40.0000 mg | INTRAMUSCULAR | Status: AC | PRN
  Administered 2016-10-05: 40 mg via INTRA_ARTICULAR

## 2016-10-05 MED ORDER — LIDOCAINE HCL 1 % IJ SOLN
1.5000 mL | INTRAMUSCULAR | Status: AC | PRN
  Administered 2016-10-05: 1.5 mL

## 2016-10-05 NOTE — Patient Instructions (Addendum)
Standing Labs We placed an order today for your standing lab work.    Please come back and get your standing labs in November and very 5 months Iliotibial Bursitis Rehab Ask your health care provider which exercises are safe for you. Do exercises exactly as told by your health care provider and adjust them as directed. It is normal to feel mild stretching, pulling, tightness, or discomfort as you do these exercises, but you should stop right away if you feel sudden pain or your pain gets worse.Do not begin these exercises until told by your health care provider. Stretching and range of motion exercises These exercises warm up your muscles and joints and improve the movement and flexibility of your leg. These exercises also help to relieve pain and stiffness. Exercise A: Quadriceps stretch, prone  1. Lie on your abdomen on a firm surface, such as a bed or padded floor. 2. Bend your left / right knee and hold your ankle. If you cannot reach your ankle or pant leg, loop a belt around your foot and grab the belt instead. 3. Gently pull your heel toward your buttocks. Your knee should not slide out to the side. You should feel a stretch in the front of your thigh and knee. 4. Hold this position for __________ seconds. Repeat __________ times. Complete this exercise __________ times a day. Exercise B: Lunge ( adductor stretch) 1. Stand and spread your legs about 3 feet (about 1 m) apart. Put your left / right leg slightly back for balance. 2. Lean away from your left / right leg by bending your other knee and shifting your weight toward your bent knee. You may rest your hands on your thigh for balance. You should feel a stretch in your left / right inner thigh. 3. Hold for __________ seconds. Repeat __________ times. Complete this exercise __________ times a day. Exercise C: Hamstring stretch, supine  1. Lie on your back. 2. Hold both ends of a belt or towel as you loop it over the ball of your  left / right foot. The ball of your foot is on the walking surface, right under your toes. 3. Straighten your left / right knee and slowly pull on the belt to raise your leg. Stop when you feel a gentle stretch in the back of your left / right knee or thigh. ? Do not let your left / right knee bend. ? Keep your other leg flat on the floor. 4. Hold this position for __________ seconds. Repeat __________ times. Complete this exercise __________ times a day. Strengthening exercises These exercises build strength and endurance in your leg. Endurance is the ability to use your muscles for a long time, even after they get tired. Exercise D: Quadriceps wall slides  1. Lean your back against a smooth wall or door while you walk your feet out 18-24 inches (46-61 cm) from it. 2. Place your feet hip-width apart. 3. Slowly slide down the wall or door until your knees bend as far as told by your health care provider. Keep your knees over your heels, not your toes. Keep your knees in line with your hips. 4. Hold for __________ seconds. 5. Push through your heels to stand up to rest for __________ seconds after each repetition. Repeat __________ times. Complete this exercise __________ times a day. Exercise E: Straight leg raises ( hip abductors) 1. Lie on your side, with your left / right leg in the top position. Lie so your head, shoulder, knee,  and hip line up with each other. You may bend your bottom knee to help you balance. 2. Lift your top leg 4-6 inches (10-15 cm) while keeping your toes pointed straight ahead. 3. Hold this position for __________ seconds. 4. Slowly lower your leg to the starting position. Allow your muscles to relax completely after each repetition. Repeat __________ times. Complete this exercise __________ times a day. Exercise F: Straight leg raises ( hip extensors) 1. Lie on your abdomen on a firm surface. You can put a pillow under your hips if that is more  comfortable. 2. Tense the muscles in your buttocks and lift your left / right leg about 4-6 inches (10-15 cm). Keep your knee straight as you lift your leg. 3. Hold this position for __________ seconds. 4. Slowly lower your leg to the starting position. 5. Let your leg relax completely after each repetition. Repeat __________ times. Complete this exercise __________ times a day. Exercise G: Bridge ( hip extensors) 1. Lie on your back on a firm surface with your knees bent and your feet flat on the floor. 2. Tighten your buttocks muscles and lift your bottom off the floor until your trunk is level with your thighs. ? Do not arch your back. ? You should feel the muscles working in your buttocks and the back of your thighs. If you do not feel these muscles, slide your feet 1-2 inches (2.5-5 cm) farther away from your buttocks. 3. Hold this position for __________ seconds. 4. Slowly lower your hips to the starting position. 5. Let your buttocks muscles relax completely between repetitions. 6. If this exercise is too easy, try doing it with your arms crossed over your chest. Repeat __________ times. Complete this exercise __________ times a day. This information is not intended to replace advice given to you by your health care provider. Make sure you discuss any questions you have with your health care provider. Document Released: 04/04/2005 Document Revised: 12/10/2015 Document Reviewed: 03/17/2015 Elsevier Interactive Patient Education  [employer].   We have open lab Monday through Friday from 8:30-11:30 AM and 1:30-4 PM at the office of Dr. Tresa Moore, PA.   The office is located at [address], [place], [place], [place] [address] No appointment is necessary.   Labs are drawn by [facility].  You may receive a bill from [facility] for your lab work. If you have any questions regarding directions or hours of operation,  please call [phone].

## 2016-10-18 ENCOUNTER — Ambulatory Visit: Payer: Commercial Managed Care - HMO | Admitting: Rheumatology

## 2016-11-03 DIAGNOSIS — R8290 Unspecified abnormal findings in urine: Secondary | ICD-10-CM | POA: Diagnosis not present

## 2016-11-03 DIAGNOSIS — Z0000 Encounter for general adult medical examination without abnormal findings: Secondary | ICD-10-CM | POA: Diagnosis not present

## 2016-11-03 DIAGNOSIS — E785 Hyperlipidemia, unspecified: Secondary | ICD-10-CM | POA: Diagnosis not present

## 2016-11-11 ENCOUNTER — Telehealth: Payer: Self-pay | Admitting: Rheumatology

## 2016-11-11 MED ORDER — HYDROXYCHLOROQUINE SULFATE 200 MG PO TABS: 200.0000 mg | ORAL_TABLET | Freq: Two times a day (BID) | ORAL | 0 refills | Status: DC

## 2016-11-11 NOTE — Telephone Encounter (Signed)
Patient called to let us know that [facility] will be sending [place] a request to fill her PLQ.  CB#[phone].  Thank you.

## 2016-11-11 NOTE — Telephone Encounter (Signed)
Last Visit: 10/05/16 Next Visit: 03/08/17  Labs: 09/19/16 WNL Plaquenil eye exam 11/10/2015 WNL Has an appointment scheduled for 12/14/16   Okay to refill per Dr. Estanislado Pandy

## 2016-11-17 ENCOUNTER — Telehealth: Payer: Self-pay | Admitting: Radiology

## 2016-11-17 NOTE — Telephone Encounter (Signed)
Refill request received via fax for PLQ   This is duplicate, was just sent in a few days ago

## 2016-11-28 DIAGNOSIS — M05761 Rheumatoid arthritis with rheumatoid factor of right knee without organ or systems involvement: Secondary | ICD-10-CM | POA: Diagnosis not present

## 2016-11-28 DIAGNOSIS — M1711 Unilateral primary osteoarthritis, right knee: Secondary | ICD-10-CM | POA: Diagnosis not present

## 2016-11-28 DIAGNOSIS — E785 Hyperlipidemia, unspecified: Secondary | ICD-10-CM | POA: Diagnosis not present

## 2016-11-28 DIAGNOSIS — Z79899 Other long term (current) drug therapy: Secondary | ICD-10-CM | POA: Diagnosis not present

## 2016-11-28 DIAGNOSIS — R251 Tremor, unspecified: Secondary | ICD-10-CM | POA: Diagnosis not present

## 2016-11-28 DIAGNOSIS — R8290 Unspecified abnormal findings in urine: Secondary | ICD-10-CM | POA: Diagnosis not present

## 2016-12-14 DIAGNOSIS — H524 Presbyopia: Secondary | ICD-10-CM | POA: Diagnosis not present

## 2016-12-14 DIAGNOSIS — H5203 Hypermetropia, bilateral: Secondary | ICD-10-CM | POA: Diagnosis not present

## 2016-12-14 DIAGNOSIS — H52223 Regular astigmatism, bilateral: Secondary | ICD-10-CM | POA: Diagnosis not present

## 2016-12-14 DIAGNOSIS — Z79899 Other long term (current) drug therapy: Secondary | ICD-10-CM | POA: Diagnosis not present

## 2016-12-14 DIAGNOSIS — H25813 Combined forms of age-related cataract, bilateral: Secondary | ICD-10-CM | POA: Diagnosis not present

## 2016-12-20 DIAGNOSIS — Z1231 Encounter for screening mammogram for malignant neoplasm of breast: Secondary | ICD-10-CM | POA: Diagnosis not present

## 2017-01-11 ENCOUNTER — Other Ambulatory Visit: Payer: Self-pay | Admitting: Rheumatology

## 2017-01-11 NOTE — Telephone Encounter (Addendum)
Last Visit: 10/05/16 Next Visit: 03/16/17 Labs: 09/19/16 WNL PLQ Eye Exam: 12/14/2016 WNL  Okay to refill per Dr. Estanislado Pandy

## 2017-01-27 ENCOUNTER — Ambulatory Visit: Payer: Commercial Managed Care - HMO | Admitting: Rheumatology

## 2017-02-17 DIAGNOSIS — G20 Parkinson's disease: Secondary | ICD-10-CM | POA: Diagnosis not present

## 2017-03-07 NOTE — Progress Notes (Addendum)
Office Visit Note  Patient: Dawn Harrell             Date of Birth: October 06, 1945           MRN: [id number]             PCP: Melony Overly, MD Referring: Melony Overly, MD Visit Date: 03/16/2017 Occupation: @GUAROCC @    Subjective:  (BIL knee pain )   History of Present Illness: Dawn Harrell is a 71 y.o. female history of rheumatoid arthritis and osteoarthritis overlap. She states her knee joints continue to hurt her. Recently she's had increased pain in her knee joints. She's been avoiding stairs. None of the other joints have been painful or swollen.  Activities of Daily Living:  Patient reports morning stiffness for 2 minutes.   Patient Denies nocturnal pain.  Difficulty dressing/grooming: Denies Difficulty climbing stairs: Reports Difficulty getting out of chair: Denies Difficulty using hands for taps, buttons, cutlery, and/or writing: Denies   Review of Systems  Constitutional: Positive for fatigue. Negative for night sweats, weight gain, weight loss and weakness.  HENT: Negative for mouth sores, trouble swallowing, trouble swallowing, mouth dryness and nose dryness.   Eyes: Negative for pain, redness, visual disturbance and dryness.  Respiratory: Negative for cough, shortness of breath and difficulty breathing.   Cardiovascular: Negative for chest pain, palpitations, hypertension, irregular heartbeat and swelling in legs/feet.  Gastrointestinal: Negative for blood in stool, constipation and diarrhea.  Endocrine: Negative for increased urination.  Genitourinary: Negative for vaginal dryness.  Musculoskeletal: Positive for arthralgias, joint pain and morning stiffness. Negative for joint swelling, myalgias, muscle weakness, muscle tenderness and myalgias.  Skin: Negative for color change, rash, hair loss, skin tightness, ulcers and sensitivity to sunlight.  Allergic/Immunologic: Negative for susceptible to infections.  Neurological: Negative for dizziness, memory  loss and night sweats.  Hematological: Negative for swollen glands.  Psychiatric/Behavioral: Negative for depressed mood and sleep disturbance. The patient is not nervous/anxious.     PMFS History:  Patient Active Problem List   Diagnosis Date Noted  . Chondromalacia of both patellae 10/04/2016  . History of tremor/ Parkinsons dx.  10/04/2016  . History of TIA (transient ischemic attack) 10/04/2016  . History of hyperlipidemia 10/04/2016  . Tremor 03/24/2016  . Rheumatoid arthritis involving multiple sites with positive rheumatoid factor ([place]) 03/24/2016  . Primary osteoarthritis of both hands 03/24/2016  . Primary osteoarthritis of both knees 03/24/2016  . Primary osteoarthritis of both feet 03/24/2016  . High risk medication use 03/24/2016    Past Medical History:  Diagnosis Date  . Parkinson's disease ([place])   . skin cancer     Family History  Problem Relation Age of Onset  . Stroke Mother   . Heart attack Father   . Rheum arthritis Sister   . Lung disease Brother   . Diabetes Maternal Grandmother   . Diabetes Paternal Grandmother   . Leukemia Sister    Past Surgical History:  Procedure Laterality Date  . ABDOMINAL HYSTERECTOMY    . bladder tack    . SKIN CANCER EXCISION  11/2015   Social History   Social History Narrative  . Not on file     Objective: Vital Signs: BP 130/72 (BP Location: Left Arm, Patient Position: Sitting, Cuff Size: Normal)   Pulse 66   Resp 15   Ht 5\' 1"  (1.549 m)   Wt 155 lb (70.3 kg)   BMI 29.29 kg/m    Physical Exam  Constitutional: She is oriented  to person, place, and time. She appears well-developed and well-nourished.  HENT:  Head: Normocephalic and atraumatic.  Eyes: Conjunctivae and EOM are normal.  Neck: Normal range of motion.  Cardiovascular: Normal rate, regular rhythm, normal heart sounds and intact distal pulses.  Pulmonary/Chest: Effort normal and breath sounds normal.  Abdominal: Soft. Bowel sounds are normal.    Lymphadenopathy:    She has no cervical adenopathy.  Neurological: She is alert and oriented to person, place, and time.  Skin: Skin is warm and dry. Capillary refill takes less than 2 seconds.  Psychiatric: She has a normal mood and affect. Her behavior is normal.  Nursing note and vitals reviewed.    Musculoskeletal Exam: C-spine and thoracic lumbar spine good range of motion. Shoulder joints elbow joints wrist joints are good range of motion. She is some synovial thickening over bilateral second and third MCP joint but no synovitis was noted. She has DIP prominence in her hands consistent with osteoarthritis. She had no warmth swelling or effusion in her knee joints but had painful range of motion of bilateral knee joints with some crepitus. She has overcrowding of toes with bilateral first MTP valgus deformity.  CDAI Exam: CDAI Homunculus Exam:   Tenderness:  RLE: tibiofemoral LLE: tibiofemoral  Joint Counts:  CDAI Tender Joint count: 2 CDAI Swollen Joint count: 0  Global Assessments:  Patient Global Assessment: 3 Provider Global Assessment: 3  CDAI Calculated Score: 8    Investigation: No additional findings.PLQ eye exam: 11/2016 CBC Latest Ref Rng & Units 09/19/2016 03/07/2016  WBC 3.4 - 10.8 x10E3/uL 7.6 7.7  Hemoglobin 11.1 - 15.9 g/dL 13.8 13.3  Hematocrit 34.0 - 46.6 % 40.5 39.3  Platelets 150 - 379 x10E3/uL 234 248   CMP Latest Ref Rng & Units 09/19/2016 03/07/2016  Glucose 65 - 99 mg/dL 92 97  BUN 8 - 27 mg/dL 12 10  Creatinine 0.57 - 1.00 mg/dL 0.79 0.72  Sodium 134 - 144 mmol/L 135 138  Potassium 3.5 - 5.2 mmol/L 4.0 4.6  Chloride 96 - 106 mmol/L 95(L) 98  CO2 18 - 29 mmol/L 26 26  Calcium 8.7 - 10.3 mg/dL 9.4 9.6  Total Protein 6.0 - 8.5 g/dL 6.3 6.2  Total Bilirubin 0.0 - 1.2 mg/dL 0.8 0.6  Alkaline Phos 39 - 117 IU/L 87 97  AST 0 - 40 IU/L 22 27  ALT 0 - 32 IU/L 24 21    Imaging: Xr Foot 2 Views Left  Result Date: 03/16/2017 First MTP narrowing.   All PIP and DIP joint space narrowing. Inferior and posterior calcaneal spurs. Impression: Findings are consistent with osteoarthritis   Xr Foot 2 Views Right  Result Date: 03/16/2017 First MTP narrowing.  All PIP and DIP joint space narrowing. Inferior and posterior calcaneal spurs. Impression: Findings are consistent with osteoarthritis   Xr Hand 2 View Left  Result Date: 03/16/2017 Juxta-articular osteopenia.  Second and third MCP narrowing.  All PIP and DIP narrowing.  No intercarpal or radiocarpal joint space narrowing.  2nd DIP has erosive changes and subluxation. Impression: Findings are consistent with rheumatoid arthritis and osteoarthritis overlap.   Xr Hand 2 View Right  Result Date: 03/16/2017 Juxta-articular osteopenia.  Second and third MCP narrowing.  All PIP and DIP narrowing.  No intercarpal or radiocarpal joint space narrowing.  No erosive changes.  Impression: Findings are consistent with rheumatoid arthritis and osteoarthritis overlap.   Xr Knee 3 View Left  Result Date: 03/16/2017 Medial compartment narrowing.  Intercondylar osteophytes present.  Lateral condylar osteophytes.  No chondrocalcinosis.  Severe patellofemoral joint space narrowing.  Impression: Mild osteoarthritis and severe chondromalacia patella   Xr Knee 3 View Right  Result Date: 03/16/2017 Medial compartment narrowing.  Intercondylar osteophytes present.  Lateral condylar osteophytes.  No chondrocalcinosis.  Severe patellofemoral joint space narrowing.  Impression: Mild osteoarthritis and severe chondromalacia patella    Speciality Comments: No specialty comments available.    Procedures:  Large Joint Inj: R knee on 03/16/2017 8:46 AM Indications: pain Details: 27 G 1.5 in needle, medial approach  Arthrogram: No  Medications: 1.5 mL lidocaine (PF) 1 %; 80 mg triamcinolone acetonide 40 MG/ML Aspirate: 0 mL Outcome: tolerated well, no immediate complications Procedure, treatment  alternatives, risks and benefits explained, specific risks discussed. Consent was given by the patient. Immediately prior to procedure a time out was called to verify the correct patient, procedure, equipment, support staff and site/side marked as required. Patient was prepped and draped in the usual sterile fashion.    Lidocaine lot #[id number] expiration 11/17/2018 Kenalog lot number [id number], expiration 07/16/2018  Allergies: Gabapentin   Assessment / Plan:     Visit Diagnoses: Rheumatoid arthritis involving multiple sites with positive rheumatoid factor ([place]) - she is clinically doing well without any synovitis. We will obtain follow-up x-rays today to look for progression of disease process. Plan: XR Hand 2 View Right, XR Hand 2 View Left, XR Foot 2 Views Right, XR Foot 2 Views Left  High risk medication use - PLQ 200 mg by mouth twice a day Monday to Friday.eye exam: 11/10/2015 - Plan: CBC with Differential/Platelet, COMPLETE METABOLIC PANEL WITH GFR today and then every 5 months.  Primary osteoarthritis of both hands: She has some stiffness but no synovitis.  Primary osteoarthritis of both knees: She's been having increased pain in her bilateral knee joints today. We will obtain x-ray of bilateral knee joints 2 views today. She requests cortisone injection. Her right knee joint is more painful. I will proceed with right knee joint cortisone injection in the procedures described above. She's had cortisone injections in the past which do not last very long. We discussed  applying for Visco supplement injections.  Chondromalacia of both patellae: She has discomfort in her knee joints with kneeling.  Primary osteoarthritis of both feet: She has overcrowding of toes. We will obtain x-ray of her bilateral feet today.  And other medical problems are listed as follows:  History of TIA (transient ischemic attack)  History of hyperlipidemia  History of tremor/ Parkinsons dx.      Orders: Orders Placed This Encounter  Procedures  . Large Joint Inj  . XR Hand 2 View Right  . XR Hand 2 View Left  . XR KNEE 3 VIEW LEFT  . XR KNEE 3 VIEW RIGHT  . XR Foot 2 Views Right  . XR Foot 2 Views Left  . CBC with Differential/Platelet  . COMPLETE METABOLIC PANEL WITH GFR   No orders of the defined types were placed in this encounter.   Follow-Up Instructions: Return in about 5 months (around 08/14/2017) for Rheumatoid arthritis.   Bo Merino, MD  Note - This record has been created using Editor, commissioning.  Chart creation errors have been sought, but may not always  have been located. Such creation errors do not reflect on  the standard of medical care.

## 2017-03-08 ENCOUNTER — Ambulatory Visit: Payer: Commercial Managed Care - HMO | Admitting: Rheumatology

## 2017-03-14 ENCOUNTER — Other Ambulatory Visit: Payer: Self-pay | Admitting: Rheumatology

## 2017-03-15 NOTE — Telephone Encounter (Signed)
Last Visit: 10/05/16 Next Visit: 03/16/17 Labs: 09/19/16 WNL PLQ Eye Exam: 12/14/2016 WNL  Okay to refill per Dr. Estanislado Pandy

## 2017-03-16 ENCOUNTER — Ambulatory Visit (INDEPENDENT_AMBULATORY_CARE_PROVIDER_SITE_OTHER): Payer: Commercial Managed Care - HMO

## 2017-03-16 ENCOUNTER — Ambulatory Visit (INDEPENDENT_AMBULATORY_CARE_PROVIDER_SITE_OTHER): Payer: Self-pay

## 2017-03-16 ENCOUNTER — Ambulatory Visit: Payer: Commercial Managed Care - HMO | Admitting: Rheumatology

## 2017-03-16 ENCOUNTER — Encounter: Payer: Self-pay | Admitting: Rheumatology

## 2017-03-16 VITALS — BP 130/72 | HR 66 | Resp 15 | Ht 61.0 in | Wt 155.0 lb

## 2017-03-16 DIAGNOSIS — Z79899 Other long term (current) drug therapy: Secondary | ICD-10-CM | POA: Diagnosis not present

## 2017-03-16 DIAGNOSIS — M19042 Primary osteoarthritis, left hand: Secondary | ICD-10-CM | POA: Diagnosis not present

## 2017-03-16 DIAGNOSIS — Z8669 Personal history of other diseases of the nervous system and sense organs: Secondary | ICD-10-CM

## 2017-03-16 DIAGNOSIS — M0579 Rheumatoid arthritis with rheumatoid factor of multiple sites without organ or systems involvement: Secondary | ICD-10-CM

## 2017-03-16 DIAGNOSIS — M19071 Primary osteoarthritis, right ankle and foot: Secondary | ICD-10-CM | POA: Diagnosis not present

## 2017-03-16 DIAGNOSIS — M19041 Primary osteoarthritis, right hand: Secondary | ICD-10-CM | POA: Diagnosis not present

## 2017-03-16 DIAGNOSIS — M19072 Primary osteoarthritis, left ankle and foot: Secondary | ICD-10-CM | POA: Diagnosis not present

## 2017-03-16 DIAGNOSIS — M2241 Chondromalacia patellae, right knee: Secondary | ICD-10-CM

## 2017-03-16 DIAGNOSIS — Z8673 Personal history of transient ischemic attack (TIA), and cerebral infarction without residual deficits: Secondary | ICD-10-CM

## 2017-03-16 DIAGNOSIS — M2242 Chondromalacia patellae, left knee: Secondary | ICD-10-CM

## 2017-03-16 DIAGNOSIS — M170 Bilateral primary osteoarthritis of knee: Secondary | ICD-10-CM

## 2017-03-16 DIAGNOSIS — Z8639 Personal history of other endocrine, nutritional and metabolic disease: Secondary | ICD-10-CM

## 2017-03-16 LAB — COMPLETE METABOLIC PANEL WITH GFR
AG Ratio: 2.1 (calc) (ref 1.0–2.5)
ALT: 10 U/L (ref 6–29)
AST: 23 U/L (ref 10–35)
Albumin: 3.9 g/dL (ref 3.6–5.1)
Alkaline phosphatase (APISO): 87 U/L (ref 33–130)
BUN: 12 mg/dL (ref 7–25)
CALCIUM: 9.1 mg/dL (ref 8.6–10.4)
CO2: 28 mmol/L (ref 20–32)
CREATININE: 0.73 mg/dL (ref 0.60–0.93)
Chloride: 99 mmol/L (ref 98–110)
GFR, EST AFRICAN AMERICAN: 97 mL/min/{1.73_m2} (ref >=60)
GFR, EST NON AFRICAN AMERICAN: 83 mL/min/{1.73_m2} (ref >=60)
GLUCOSE: 84 mg/dL (ref 65–99)
Globulin: 1.9 g/dL (calc) (ref 1.9–3.7)
Potassium: 4 mmol/L (ref 3.5–5.3)
Sodium: 134 mmol/L — ABNORMAL LOW (ref 135–146)
TOTAL PROTEIN: 5.8 g/dL — AB (ref 6.1–8.1)
Total Bilirubin: 0.6 mg/dL (ref 0.2–1.2)

## 2017-03-16 LAB — CBC WITH DIFFERENTIAL/PLATELET
BASOS ABS: 90 {cells}/uL (ref 0–200)
Basophils Relative: 1.7 %
EOS ABS: 122 {cells}/uL (ref 15–500)
Eosinophils Relative: 2.3 %
HCT: 37.5 % (ref 35.0–45.0)
Hemoglobin: 12.6 g/dL (ref 11.7–15.5)
Lymphs Abs: 1749 cells/uL (ref 850–3900)
MCH: 27.6 pg (ref 27.0–33.0)
MCHC: 33.6 g/dL (ref 32.0–36.0)
MCV: 82.2 fL (ref 80.0–100.0)
MONOS PCT: 9 %
MPV: 10.6 fL (ref 7.5–12.5)
NEUTROS PCT: 54 %
Neutro Abs: 2862 cells/uL (ref 1500–7800)
PLATELETS: 253 10*3/uL (ref 140–400)
RBC: 4.56 10*6/uL (ref 3.80–5.10)
RDW: 12.3 % (ref 11.0–15.0)
TOTAL LYMPHOCYTE: 33 %
WBC mixed population: 477 cells/uL (ref 200–950)
WBC: 5.3 10*3/uL (ref 3.8–10.8)

## 2017-03-16 MED ORDER — TRIAMCINOLONE ACETONIDE 40 MG/ML IJ SUSP
80.0000 mg | INTRAMUSCULAR | Status: AC | PRN
  Administered 2017-03-16: 80 mg via INTRA_ARTICULAR

## 2017-03-16 MED ORDER — LIDOCAINE HCL (PF) 1 % IJ SOLN
1.5000 mL | INTRAMUSCULAR | Status: AC | PRN
  Administered 2017-03-16: 1.5 mL

## 2017-03-16 NOTE — Patient Instructions (Signed)

## 2017-03-17 NOTE — Progress Notes (Signed)
Labs are stable. We will continue to monitor.

## 2017-03-31 DIAGNOSIS — G20 Parkinson's disease: Secondary | ICD-10-CM | POA: Diagnosis not present

## 2017-04-05 ENCOUNTER — Telehealth: Payer: Self-pay

## 2017-04-05 NOTE — Telephone Encounter (Signed)
Patient called stating that her insurance has been calling concerning needing information about Gel Injection.  Patient has [employer].  Patient didn't have a number for [place] to call her insurance.  Please advise.

## 2017-04-13 DIAGNOSIS — Z23 Encounter for immunization: Secondary | ICD-10-CM | POA: Diagnosis not present

## 2017-04-24 ENCOUNTER — Ambulatory Visit: Payer: Commercial Managed Care - HMO | Admitting: Rheumatology

## 2017-04-24 DIAGNOSIS — M170 Bilateral primary osteoarthritis of knee: Secondary | ICD-10-CM

## 2017-04-24 DIAGNOSIS — M1711 Unilateral primary osteoarthritis, right knee: Secondary | ICD-10-CM | POA: Diagnosis not present

## 2017-04-24 DIAGNOSIS — M1712 Unilateral primary osteoarthritis, left knee: Secondary | ICD-10-CM | POA: Diagnosis not present

## 2017-04-24 MED ORDER — LIDOCAINE HCL 1 % IJ SOLN
1.5000 mL | INTRAMUSCULAR | Status: AC | PRN
  Administered 2017-04-24: 1.5 mL

## 2017-04-24 MED ORDER — SODIUM HYALURONATE (VISCOSUP) 20 MG/2ML IX SOSY
20.0000 mg | PREFILLED_SYRINGE | INTRA_ARTICULAR | Status: AC | PRN
  Administered 2017-04-24: 20 mg via INTRA_ARTICULAR

## 2017-04-24 NOTE — Progress Notes (Signed)
   Procedure Note  Patient: Dawn Harrell             Date of Birth: Oct 11, 1945           MRN: [id number]             Visit Date: 04/24/2017  Procedures: Visit Diagnoses: No diagnosis found. Hyalgan #1 Bilateral   Large Joint Inj: bilateral knee on 04/24/2017 7:56 AM Indications: pain Details: 27 G 1.5 in needle, medial approach  Arthrogram: No  Medications (Right): 20 mg Sodium Hyaluronate 20 MG/2ML; 1.5 mL lidocaine 1 % Aspirate (Right): 0 mL Medications (Left): 20 mg Sodium Hyaluronate 20 MG/2ML; 1.5 mL lidocaine 1 % Aspirate (Left): 0 mL Outcome: tolerated well, no immediate complications Procedure, treatment alternatives, risks and benefits explained, specific risks discussed. Consent was given by the patient. Immediately prior to procedure a time out was called to verify the correct patient, procedure, equipment, support staff and site/side marked as required. Patient was prepped and draped in the usual sterile fashion.    Bo Merino, MD

## 2017-04-25 ENCOUNTER — Ambulatory Visit: Payer: Commercial Managed Care - HMO | Admitting: Rheumatology

## 2017-05-01 ENCOUNTER — Ambulatory Visit: Payer: Commercial Managed Care - HMO | Admitting: Rheumatology

## 2017-05-01 DIAGNOSIS — M1711 Unilateral primary osteoarthritis, right knee: Secondary | ICD-10-CM | POA: Diagnosis not present

## 2017-05-01 DIAGNOSIS — M1712 Unilateral primary osteoarthritis, left knee: Secondary | ICD-10-CM | POA: Diagnosis not present

## 2017-05-01 DIAGNOSIS — M170 Bilateral primary osteoarthritis of knee: Secondary | ICD-10-CM

## 2017-05-01 MED ORDER — SODIUM HYALURONATE (VISCOSUP) 20 MG/2ML IX SOSY
20.0000 mg | PREFILLED_SYRINGE | INTRA_ARTICULAR | Status: AC | PRN
  Administered 2017-05-01: 20 mg via INTRA_ARTICULAR

## 2017-05-01 MED ORDER — LIDOCAINE HCL 1 % IJ SOLN
1.5000 mL | INTRAMUSCULAR | Status: AC | PRN
Start: 2017-05-01 — End: 2017-05-01
  Administered 2017-05-01: 1.5 mL

## 2017-05-01 MED ORDER — SODIUM HYALURONATE (VISCOSUP) 20 MG/2ML IX SOSY
20.0000 mg | PREFILLED_SYRINGE | INTRA_ARTICULAR | Status: AC | PRN
Start: 2017-05-01 — End: 2017-05-01
  Administered 2017-05-01: 20 mg via INTRA_ARTICULAR

## 2017-05-01 MED ORDER — LIDOCAINE HCL 1 % IJ SOLN
1.5000 mL | INTRAMUSCULAR | Status: AC | PRN
  Administered 2017-05-01: 1.5 mL

## 2017-05-01 NOTE — Progress Notes (Signed)
   Procedure Note  Patient: Dawn Harrell             Date of Birth: Jan 23, 1946           MRN: [id number]             Visit Date: 05/01/2017  Procedures: Visit Diagnoses: Bilateral primary osteoarthritis of knee - Plan: Large Joint Inj: bilateral knee Hyalgan #2 Bilateral  Large Joint Inj: bilateral knee on 05/01/2017 10:26 AM Indications: pain Details: 27 G 1.5 in needle, medial approach  Arthrogram: No  Medications (Right): 20 mg Sodium Hyaluronate 20 MG/2ML; 1.5 mL lidocaine 1 % Aspirate (Right): 0 mL Medications (Left): 20 mg Sodium Hyaluronate 20 MG/2ML; 1.5 mL lidocaine 1 % Aspirate (Left): 0 mL Outcome: tolerated well, no immediate complications Procedure, treatment alternatives, risks and benefits explained, specific risks discussed. Consent was given by the patient. Immediately prior to procedure a time out was called to verify the correct patient, procedure, equipment, support staff and site/side marked as required. Patient was prepped and draped in the usual sterile fashion.     Bo Merino, MD

## 2017-05-08 ENCOUNTER — Ambulatory Visit: Payer: Commercial Managed Care - HMO | Admitting: Rheumatology

## 2017-05-08 DIAGNOSIS — M1711 Unilateral primary osteoarthritis, right knee: Secondary | ICD-10-CM | POA: Diagnosis not present

## 2017-05-08 DIAGNOSIS — M170 Bilateral primary osteoarthritis of knee: Secondary | ICD-10-CM

## 2017-05-08 DIAGNOSIS — M1712 Unilateral primary osteoarthritis, left knee: Secondary | ICD-10-CM

## 2017-05-08 MED ORDER — SODIUM HYALURONATE (VISCOSUP) 20 MG/2ML IX SOSY
20.0000 mg | PREFILLED_SYRINGE | INTRA_ARTICULAR | Status: AC | PRN
  Administered 2017-05-08: 20 mg via INTRA_ARTICULAR

## 2017-05-08 MED ORDER — LIDOCAINE HCL 1 % IJ SOLN
1.5000 mL | INTRAMUSCULAR | Status: AC | PRN
  Administered 2017-05-08: 1.5 mL

## 2017-05-08 NOTE — Progress Notes (Signed)
   Procedure Note  Patient: Raelie Lohr             Date of Birth: January 21, 1946           MRN: [id number]             Visit Date: 05/08/2017  Procedures: Visit Diagnoses: Primary osteoarthritis of both knees Hyalgan #3 Bilateral  Large Joint Inj: bilateral knee on 05/08/2017 8:09 AM Indications: pain Details: 27 G 1.5 in needle, medial approach  Arthrogram: No  Medications (Right): 20 mg Sodium Hyaluronate 20 MG/2ML; 1.5 mL lidocaine 1 % Aspirate (Right): 0 mL Medications (Left): 20 mg Sodium Hyaluronate 20 MG/2ML; 1.5 mL lidocaine 1 % Aspirate (Left): 0 mL Outcome: tolerated well, no immediate complications Procedure, treatment alternatives, risks and benefits explained, specific risks discussed. Consent was given by the patient. Immediately prior to procedure a time out was called to verify the correct patient, procedure, equipment, support staff and site/side marked as required. Patient was prepped and draped in the usual sterile fashion.    Bo Merino, MD

## 2017-05-12 ENCOUNTER — Other Ambulatory Visit: Payer: Self-pay | Admitting: Rheumatology

## 2017-05-12 NOTE — Telephone Encounter (Signed)
Last Visit: 03/16/17 Next Visit: 08/15/17 Labs: 03/16/17 stable PLQ Eye Exam: 12/14/16 WNL  Okay to refill per Dr. Estanislado Pandy

## 2017-05-15 ENCOUNTER — Ambulatory Visit: Payer: Commercial Managed Care - HMO | Admitting: Rheumatology

## 2017-05-15 DIAGNOSIS — M170 Bilateral primary osteoarthritis of knee: Secondary | ICD-10-CM | POA: Diagnosis not present

## 2017-05-15 MED ORDER — SODIUM HYALURONATE (VISCOSUP) 20 MG/2ML IX SOSY
20.0000 mg | PREFILLED_SYRINGE | INTRA_ARTICULAR | Status: AC | PRN
  Administered 2017-05-15: 20 mg via INTRA_ARTICULAR

## 2017-05-15 MED ORDER — LIDOCAINE HCL 1 % IJ SOLN
1.5000 mL | INTRAMUSCULAR | Status: AC | PRN
Start: 2017-05-15 — End: 2017-05-15
  Administered 2017-05-15: 1.5 mL

## 2017-05-15 MED ORDER — LIDOCAINE HCL 1 % IJ SOLN
1.5000 mL | INTRAMUSCULAR | Status: AC | PRN
  Administered 2017-05-15: 1.5 mL

## 2017-05-15 NOTE — Progress Notes (Signed)
   Procedure Note  Patient: Dawn Harrell             Date of Birth: 12/09/1945           MRN: [id number]             Visit Date: 05/15/2017  Procedures: Visit Diagnoses: Primary osteoarthritis of both knees - Plan: Large Joint Inj: bilateral knee Hyalgan #4 bilateral  Large Joint Inj: bilateral knee on 05/15/2017 8:04 AM Indications: pain Details: 27 G 1.5 in needle, medial approach  Arthrogram: No  Medications (Right): 20 mg Sodium Hyaluronate 20 MG/2ML; 1.5 mL lidocaine 1 % Aspirate (Right): 0 mL Medications (Left): 20 mg Sodium Hyaluronate 20 MG/2ML; 1.5 mL lidocaine 1 % Aspirate (Left): 0 mL Outcome: tolerated well, no immediate complications Procedure, treatment alternatives, risks and benefits explained, specific risks discussed. Consent was given by the patient. Immediately prior to procedure a time out was called to verify the correct patient, procedure, equipment, support staff and site/side marked as required. Patient was prepped and draped in the usual sterile fashion.     Bo Merino, MD

## 2017-05-22 ENCOUNTER — Ambulatory Visit: Payer: Commercial Managed Care - HMO | Admitting: Physician Assistant

## 2017-05-22 DIAGNOSIS — M25552 Pain in left hip: Secondary | ICD-10-CM | POA: Diagnosis not present

## 2017-05-22 DIAGNOSIS — M1711 Unilateral primary osteoarthritis, right knee: Secondary | ICD-10-CM | POA: Diagnosis not present

## 2017-05-22 DIAGNOSIS — M1712 Unilateral primary osteoarthritis, left knee: Secondary | ICD-10-CM | POA: Diagnosis not present

## 2017-05-22 DIAGNOSIS — M170 Bilateral primary osteoarthritis of knee: Secondary | ICD-10-CM

## 2017-05-22 MED ORDER — SODIUM HYALURONATE (VISCOSUP) 20 MG/2ML IX SOSY
20.0000 mg | PREFILLED_SYRINGE | INTRA_ARTICULAR | Status: AC | PRN
  Administered 2017-05-22: 20 mg via INTRA_ARTICULAR

## 2017-05-22 MED ORDER — LIDOCAINE HCL 1 % IJ SOLN
1.5000 mL | INTRAMUSCULAR | Status: AC | PRN
  Administered 2017-05-22: 1.5 mL

## 2017-05-22 NOTE — Progress Notes (Signed)
   Procedure Note  Patient: Dawn Harrell             Date of Birth: [birth year]           MRN: [id number]             Visit Date: 05/22/2017  Procedures: Visit Diagnoses: Primary osteoarthritis of knees, bilateral Hyalgan #5 Bilateral  Large Joint Inj: bilateral knee on 05/22/2017 7:58 AM Indications: pain Details: 27 G 1.5 in needle, medial approach  Arthrogram: No  Medications (Right): 20 mg Sodium Hyaluronate 20 MG/2ML; 1.5 mL lidocaine 1 % Aspirate (Right): 0 mL Medications (Left): 20 mg Sodium Hyaluronate 20 MG/2ML; 1.5 mL lidocaine 1 % Aspirate (Left): 0 mL Outcome: tolerated well, no immediate complications Procedure, treatment alternatives, risks and benefits explained, specific risks discussed. Consent was given by the patient. Immediately prior to procedure a time out was called to verify the correct patient, procedure, equipment, support staff and site/side marked as required. Patient was prepped and draped in the usual sterile fashion.      Hazel Sams, PA-C

## 2017-05-31 DIAGNOSIS — E785 Hyperlipidemia, unspecified: Secondary | ICD-10-CM | POA: Diagnosis not present

## 2017-05-31 DIAGNOSIS — M05761 Rheumatoid arthritis with rheumatoid factor of right knee without organ or systems involvement: Secondary | ICD-10-CM | POA: Diagnosis not present

## 2017-05-31 DIAGNOSIS — R251 Tremor, unspecified: Secondary | ICD-10-CM | POA: Diagnosis not present

## 2017-05-31 DIAGNOSIS — D485 Neoplasm of uncertain behavior of skin: Secondary | ICD-10-CM | POA: Diagnosis not present

## 2017-05-31 DIAGNOSIS — Z79899 Other long term (current) drug therapy: Secondary | ICD-10-CM | POA: Diagnosis not present

## 2017-06-19 DIAGNOSIS — D485 Neoplasm of uncertain behavior of skin: Secondary | ICD-10-CM | POA: Diagnosis not present

## 2017-06-28 ENCOUNTER — Ambulatory Visit: Payer: Commercial Managed Care - HMO | Admitting: Rheumatology

## 2017-07-13 ENCOUNTER — Other Ambulatory Visit: Payer: Self-pay | Admitting: Rheumatology

## 2017-07-13 NOTE — Telephone Encounter (Signed)
Last Visit: 03/16/17 Next Visit: 08/15/17 Labs: 03/16/17 stable PLQ Eye Exam: 12/14/16 WNL  Okay to refill per Dr. Estanislado Pandy

## 2017-08-02 NOTE — Progress Notes (Deleted)
Office Visit Note  Patient: Dawn Harrell             Date of Birth: 1945-06-23           MRN: [id number]             PCP: Melony Overly, MD Referring: Melony Overly, MD Visit Date: 08/15/2017 Occupation: @GUAROCC @    Subjective:  No chief complaint on file.   History of Present Illness: Dawn Harrell is a 71 y.o. female ***   Activities of Daily Living:  Patient reports morning stiffness for *** {minute/hour:19697}.   Patient {ACTIONS;DENIES/REPORTS:21021675::"Denies"} nocturnal pain.  Difficulty dressing/grooming: {ACTIONS;DENIES/REPORTS:21021675::"Denies"} Difficulty climbing stairs: {ACTIONS;DENIES/REPORTS:21021675::"Denies"} Difficulty getting out of chair: {ACTIONS;DENIES/REPORTS:21021675::"Denies"} Difficulty using hands for taps, buttons, cutlery, and/or writing: {ACTIONS;DENIES/REPORTS:21021675::"Denies"}   No Rheumatology ROS completed.   PMFS History:  Patient Active Problem List   Diagnosis Date Noted  . Chondromalacia of both patellae 10/04/2016  . History of tremor/ Parkinsons dx.  10/04/2016  . History of TIA (transient ischemic attack) 10/04/2016  . History of hyperlipidemia 10/04/2016  . Tremor 03/24/2016  . Rheumatoid arthritis involving multiple sites with positive rheumatoid factor ([place]) 03/24/2016  . Primary osteoarthritis of both hands 03/24/2016  . Primary osteoarthritis of both knees 03/24/2016  . Primary osteoarthritis of both feet 03/24/2016  . High risk medication use 03/24/2016    Past Medical History:  Diagnosis Date  . Parkinson's disease ([place])   . skin cancer     Family History  Problem Relation Age of Onset  . Stroke Mother   . Heart attack Father   . Rheum arthritis Sister   . Lung disease Brother   . Diabetes Maternal Grandmother   . Diabetes Paternal Grandmother   . Leukemia Sister    Past Surgical History:  Procedure Laterality Date  . ABDOMINAL HYSTERECTOMY    . bladder tack    . SKIN CANCER EXCISION  11/2015    Social History   Social History Narrative  . Not on file     Objective: Vital Signs: There were no vitals taken for this visit.   Physical Exam   Musculoskeletal Exam: ***  CDAI Exam: No CDAI exam completed.    Investigation: No additional findings.PLQ eye exam: 12/14/2016 CBC Latest Ref Rng & Units 03/16/2017 09/19/2016 03/07/2016  WBC 3.8 - 10.8 Thousand/uL 5.3 7.6 7.7  Hemoglobin 11.7 - 15.5 g/dL 12.6 13.8 13.3  Hematocrit 35.0 - 45.0 % 37.5 40.5 39.3  Platelets 140 - 400 Thousand/uL 253 234 248   CMP Latest Ref Rng & Units 03/16/2017 09/19/2016 03/07/2016  Glucose 65 - 99 mg/dL 84 92 97  BUN 7 - 25 mg/dL 12 12 10   Creatinine 0.60 - 0.93 mg/dL 0.73 0.79 0.72  Sodium 135 - 146 mmol/L 134(L) 135 138  Potassium 3.5 - 5.3 mmol/L 4.0 4.0 4.6  Chloride 98 - 110 mmol/L 99 95(L) 98  CO2 20 - 32 mmol/L 28 26 26   Calcium 8.6 - 10.4 mg/dL 9.1 9.4 9.6  Total Protein 6.1 - 8.1 g/dL 5.8(L) 6.3 6.2  Total Bilirubin 0.2 - 1.2 mg/dL 0.6 0.8 0.6  Alkaline Phos 39 - 117 IU/L - 87 97  AST 10 - 35 U/L 23 22 27   ALT 6 - 29 U/L 10 24 21     Imaging: No results found.  Speciality Comments: PLQ Eye Exam: 12/14/16 WNL with Dr. Raelyn Ensign    Procedures:  No procedures performed Allergies: Gabapentin   Assessment / Plan:     Visit  Diagnoses: No diagnosis found.    Orders: No orders of the defined types were placed in this encounter.  No orders of the defined types were placed in this encounter.   Face-to-face time spent with patient was *** minutes. 50% of time was spent in counseling and coordination of care.  Follow-Up Instructions: No follow-ups on file.   Earnestine Mealing, CMA  Note - This record has been created using Editor, commissioning.  Chart creation errors have been sought, but may not always  have been located. Such creation errors do not reflect on  the standard of medical care.

## 2017-08-11 NOTE — Progress Notes (Signed)
Office Visit Note  Patient: Dawn Harrell             Date of Birth: 1945-12-28           MRN: [id number]             PCP: Melony Overly, MD Referring: Melony Overly, MD Visit Date: 08/15/2017 Occupation: @GUAROCC @    Subjective:  Left leg pain   History of Present Illness: Dawn Harrell is a 72 y.o. female with history of seropositive rheumatoid arthritis and osteoarthritis.  Patient states she continues to take Plaquenil 200 mg twice daily Monday through Friday.  She denies any pain in her hands or feet at this time.  She denies any joint swelling.  She denies any recent flares of her rheumatoid arthritis.  She states that bilateral knees are doing better after her Euflexxa injections. She states that in February she developed pain in her right thigh muscles.  She describes the pain as sharp and constant.  She states she has pain when she bears weight.  She states she is been taking Tylenol and Celebrex for pain relief.  She states she also uses ice and a topical over-the-counter cream which provides some relief.  She states she has pain when lying on her left side at night.  She denies any injuries or strains.  She denies any hip or groin pain.  She denies any warmth or redness.  She states that at times she has to walk with a walker or cane due to the pain.  She states she saw her PCP and had an x-ray which was unremarkable.  She states she also seen her chiropractor who did not make any recommendations.  She denies experiencing anything like this in the past.  She states that she feels some weakness in her left lower extremity.    Activities of Daily Living:  Patient reports morning stiffness for 0 none.   Patient Denies nocturnal pain.  Difficulty dressing/grooming: Denies Difficulty climbing stairs: Reports Difficulty getting out of chair: Denies Difficulty using hands for taps, buttons, cutlery, and/or writing: Denies   Review of Systems  Constitutional: Positive for  fatigue.  HENT: Negative for mouth sores, mouth dryness and nose dryness.   Eyes: Negative for pain, visual disturbance and dryness.  Respiratory: Negative for cough, hemoptysis, shortness of breath and difficulty breathing.   Cardiovascular: Negative for chest pain, palpitations, hypertension and swelling in legs/feet.  Gastrointestinal: Negative for blood in stool, constipation and diarrhea.  Endocrine: Negative for increased urination.  Genitourinary: Negative for painful urination.  Musculoskeletal: Positive for muscle tenderness. Negative for arthralgias, joint pain, joint swelling, myalgias, muscle weakness, morning stiffness and myalgias.  Skin: Negative for color change, pallor, rash, hair loss, nodules/bumps, skin tightness, ulcers and sensitivity to sunlight.  Allergic/Immunologic: Negative for susceptible to infections.  Neurological: Negative for dizziness, numbness, headaches and weakness.  Hematological: Positive for bruising/bleeding tendency. Negative for swollen glands.  Psychiatric/Behavioral: Positive for sleep disturbance. Negative for depressed mood. The patient is not nervous/anxious.     PMFS History:  Patient Active Problem List   Diagnosis Date Noted  . Chondromalacia of both patellae 10/04/2016  . History of tremor/ Parkinsons dx.  10/04/2016  . History of TIA (transient ischemic attack) 10/04/2016  . History of hyperlipidemia 10/04/2016  . Tremor 03/24/2016  . Rheumatoid arthritis involving multiple sites with positive rheumatoid factor ([place]) 03/24/2016  . Primary osteoarthritis of both hands 03/24/2016  . Primary osteoarthritis of both knees 03/24/2016  .  Primary osteoarthritis of both feet 03/24/2016  . High risk medication use 03/24/2016    Past Medical History:  Diagnosis Date  . Arthritis   . Parkinson's disease ([place])   . skin cancer     Family History  Problem Relation Age of Onset  . Stroke Mother   . Heart attack Father   . Rheum arthritis  Sister   . Lung disease Brother   . Diabetes Maternal Grandmother   . Diabetes Paternal Grandmother   . Leukemia Sister    Past Surgical History:  Procedure Laterality Date  . ABDOMINAL HYSTERECTOMY    . bladder tack    . SKIN CANCER EXCISION  11/2015   Social History   Social History Narrative  . Not on file     Objective: Vital Signs: BP 115/72 (BP Location: Left Arm, Patient Position: Sitting, Cuff Size: Normal)   Pulse 74   Resp 16   Ht 5\' 1"  (1.549 m)   Wt 145 lb (65.8 kg)   BMI 27.40 kg/m    Physical Exam  Constitutional: She is oriented to person, place, and time. She appears well-developed and well-nourished.  HENT:  Head: Normocephalic and atraumatic.  Eyes: Conjunctivae and EOM are normal.  Neck: Normal range of motion.  Cardiovascular: Normal rate, regular rhythm, normal heart sounds and intact distal pulses.  Pulmonary/Chest: Effort normal and breath sounds normal.  Abdominal: Soft. Bowel sounds are normal.  Lymphadenopathy:    She has no cervical adenopathy.  Neurological: She is alert and oriented to person, place, and time.  Skin: Skin is warm and dry. Capillary refill takes less than 2 seconds.  Psychiatric: She has a normal mood and affect. Her behavior is normal.  Nursing note and vitals reviewed.    Musculoskeletal Exam: C-spine, thoracic spine, lumbar spine good range of motion.  No midline spinal tenderness.  No SI joint tenderness.  Shoulder joints, elbow joints, wrist joints, MCPs, PIPs, DIPs good range of motion with no synovitis.  She has PIP and DIP synovial thickening consistent with osteoarthritis.  She has no thickening of bilateral CMC joints.  She has subluxation of her left first PIP joint.  Hip joints, knee joints, ankle joints, MTPs, PIPs, DIPs good range of motion no synovitis.  No warmth or effusion of bilateral knees.  No tenderness of trochanteric bursa.  He has tenderness of the quadricep muscle.  CDAI Exam: CDAI Homunculus Exam:     Joint Counts:  CDAI Tender Joint count: 0 CDAI Swollen Joint count: 0  Global Assessments:  Patient Global Assessment: 1 Provider Global Assessment: 1  CDAI Calculated Score: 2    Investigation: No additional findings.PLQ eye exam: 12/14/2016  CBC Latest Ref Rng & Units 03/16/2017 09/19/2016 03/07/2016  WBC 3.8 - 10.8 Thousand/uL 5.3 7.6 7.7  Hemoglobin 11.7 - 15.5 g/dL 12.6 13.8 13.3  Hematocrit 35.0 - 45.0 % 37.5 40.5 39.3  Platelets 140 - 400 Thousand/uL 253 234 248   CMP Latest Ref Rng & Units 03/16/2017 09/19/2016 03/07/2016  Glucose 65 - 99 mg/dL 84 92 97  BUN 7 - 25 mg/dL 12 12 10   Creatinine 0.60 - 0.93 mg/dL 0.73 0.79 0.72  Sodium 135 - 146 mmol/L 134(L) 135 138  Potassium 3.5 - 5.3 mmol/L 4.0 4.0 4.6  Chloride 98 - 110 mmol/L 99 95(L) 98  CO2 20 - 32 mmol/L 28 26 26   Calcium 8.6 - 10.4 mg/dL 9.1 9.4 9.6  Total Protein 6.1 - 8.1 g/dL 5.8(L) 6.3 6.2  Total  Bilirubin 0.2 - 1.2 mg/dL 0.6 0.8 0.6  Alkaline Phos 39 - 117 IU/L - 87 97  AST 10 - 35 U/L 23 22 27   ALT 6 - 29 U/L 10 24 21     Imaging: No results found.  Speciality Comments: PLQ Eye Exam: 12/14/16 WNL with Dr. Raelyn Ensign    Procedures:  No procedures performed Allergies: Gabapentin   Assessment / Plan:     Visit Diagnoses: Rheumatoid arthritis involving multiple sites with positive rheumatoid factor ([place]): She has no synovitis on exam.  She has not had any recent rheumatoid arthritis flares.  She has no joint pain or joint swelling at this time.  She has no morning stiffness.  She will continue taking Plaquenil 200 mg twice daily Monday through Friday.  High risk medication use - PLQ 200 mg by mouth twice a day Monday to Friday.eye exam: 12/14/2016.  CBC and CMP will be drawn today to monitor for drug toxicity.- Plan: CBC with Differential/Platelet, COMPLETE METABOLIC PANEL WITH GFR  Pain of left femur-the x-ray of the femur was unremarkable.  Her symptoms appear to be consistent with IT band  syndrome.  We will refer to physical therapy.  Primary osteoarthritis of both hands: She has PIP and DIP synovial thickening consistent with osteoarthritis.  She has bilateral CMC joint synovial thickening.  She has subluxation of her left first DIP joint.  Joint protection muscle strengthening were discussed.  Primary osteoarthritis of both knees: No warmth or effusion on exam.  Bilateral knee crepitus.  She underwent Hyalgan injections in January.  Her last injection was 05/22/2017.  She has no discomfort in her knees at this time.  She noticed significant benefit from the Hyalgan injections.  Primary osteoarthritis of both feet: No discomfort at this time.  She wears proper fitting shoes.   Chondromalacia of both patellae: Bilateral knee crepitus.    Other medical conditions are listed as follows:  History of TIA (transient ischemic attack)  History of hyperlipidemia  History of tremor/ Parkinsons dx.     Orders: Orders Placed This Encounter  Procedures  . CBC with Differential/Platelet  . COMPLETE METABOLIC PANEL WITH GFR   No orders of the defined types were placed in this encounter.   Face-to-face time spent with patient was 30 minutes. >50% of time was spent in counseling and coordination of care.  Follow-Up Instructions: Return for Rheumatoid arthritis, Osteoarthritis.   Ofilia Neas, PA-C   I examined and evaluated the patient with Hazel Sams PA.  Patient had range of motion of her hip joint and knee joint.  She has some tenderness over IT band.  X-ray of the femur was unremarkable.  The plan of care was discussed as noted above.  Bo Merino, MD  Note - This record has been created using Editor, commissioning.  Chart creation errors have been sought, but may not always  have been located. Such creation errors do not reflect on  the standard of medical care.

## 2017-08-15 ENCOUNTER — Encounter: Payer: Self-pay | Admitting: Rheumatology

## 2017-08-15 ENCOUNTER — Ambulatory Visit: Payer: Commercial Managed Care - HMO | Admitting: Rheumatology

## 2017-08-15 ENCOUNTER — Ambulatory Visit: Payer: Medicare HMO | Admitting: Rheumatology

## 2017-08-15 ENCOUNTER — Ambulatory Visit (INDEPENDENT_AMBULATORY_CARE_PROVIDER_SITE_OTHER): Payer: Medicare HMO

## 2017-08-15 VITALS — BP 115/72 | HR 74 | Resp 16 | Ht 61.0 in | Wt 145.0 lb

## 2017-08-15 DIAGNOSIS — M898X5 Other specified disorders of bone, thigh: Secondary | ICD-10-CM | POA: Diagnosis not present

## 2017-08-15 DIAGNOSIS — M0579 Rheumatoid arthritis with rheumatoid factor of multiple sites without organ or systems involvement: Secondary | ICD-10-CM

## 2017-08-15 DIAGNOSIS — M19072 Primary osteoarthritis, left ankle and foot: Secondary | ICD-10-CM | POA: Diagnosis not present

## 2017-08-15 DIAGNOSIS — M19071 Primary osteoarthritis, right ankle and foot: Secondary | ICD-10-CM

## 2017-08-15 DIAGNOSIS — M170 Bilateral primary osteoarthritis of knee: Secondary | ICD-10-CM | POA: Diagnosis not present

## 2017-08-15 DIAGNOSIS — Z8673 Personal history of transient ischemic attack (TIA), and cerebral infarction without residual deficits: Secondary | ICD-10-CM | POA: Diagnosis not present

## 2017-08-15 DIAGNOSIS — M2241 Chondromalacia patellae, right knee: Secondary | ICD-10-CM

## 2017-08-15 DIAGNOSIS — M19041 Primary osteoarthritis, right hand: Secondary | ICD-10-CM | POA: Diagnosis not present

## 2017-08-15 DIAGNOSIS — Z8669 Personal history of other diseases of the nervous system and sense organs: Secondary | ICD-10-CM

## 2017-08-15 DIAGNOSIS — Z8639 Personal history of other endocrine, nutritional and metabolic disease: Secondary | ICD-10-CM

## 2017-08-15 DIAGNOSIS — Z79899 Other long term (current) drug therapy: Secondary | ICD-10-CM | POA: Diagnosis not present

## 2017-08-15 DIAGNOSIS — M2242 Chondromalacia patellae, left knee: Secondary | ICD-10-CM

## 2017-08-15 DIAGNOSIS — M19042 Primary osteoarthritis, left hand: Secondary | ICD-10-CM

## 2017-08-15 LAB — COMPLETE METABOLIC PANEL WITH GFR
AG RATIO: 2.2 (calc) (ref 1.0–2.5)
ALBUMIN MSPROF: 4.1 g/dL (ref 3.6–5.1)
ALT: 17 U/L (ref 6–29)
AST: 23 U/L (ref 10–35)
Alkaline phosphatase (APISO): 84 U/L (ref 33–130)
BILIRUBIN TOTAL: 0.6 mg/dL (ref 0.2–1.2)
BUN: 14 mg/dL (ref 7–25)
CHLORIDE: 99 mmol/L (ref 98–110)
CO2: 32 mmol/L (ref 20–32)
Calcium: 9.2 mg/dL (ref 8.6–10.4)
Creat: 0.68 mg/dL (ref 0.60–0.93)
GFR, EST AFRICAN AMERICAN: 102 mL/min/{1.73_m2} (ref >=60)
GFR, Est Non African American: 88 mL/min/{1.73_m2} (ref >=60)
GLUCOSE: 87 mg/dL (ref 65–99)
Globulin: 1.9 g/dL (calc) (ref 1.9–3.7)
Potassium: 4.1 mmol/L (ref 3.5–5.3)
Sodium: 138 mmol/L (ref 135–146)
TOTAL PROTEIN: 6 g/dL — AB (ref 6.1–8.1)

## 2017-08-15 LAB — CBC WITH DIFFERENTIAL/PLATELET
BASOS PCT: 1.3 %
Basophils Absolute: 81 cells/uL (ref 0–200)
EOS ABS: 167 {cells}/uL (ref 15–500)
Eosinophils Relative: 2.7 %
HEMATOCRIT: 38.3 % (ref 35.0–45.0)
HEMOGLOBIN: 12.8 g/dL (ref 11.7–15.5)
LYMPHS ABS: 2269 {cells}/uL (ref 850–3900)
MCH: 27.1 pg (ref 27.0–33.0)
MCHC: 33.4 g/dL (ref 32.0–36.0)
MCV: 81.1 fL (ref 80.0–100.0)
MPV: 10.2 fL (ref 7.5–12.5)
Monocytes Relative: 7.5 %
NEUTROS ABS: 3218 {cells}/uL (ref 1500–7800)
Neutrophils Relative %: 51.9 %
Platelets: 229 10*3/uL (ref 140–400)
RBC: 4.72 10*6/uL (ref 3.80–5.10)
RDW: 13.1 % (ref 11.0–15.0)
Total Lymphocyte: 36.6 %
WBC: 6.2 10*3/uL (ref 3.8–10.8)
WBCMIX: 465 {cells}/uL (ref 200–950)

## 2017-08-16 NOTE — Progress Notes (Signed)
Total protein stable. All other lab values are WNL.

## 2017-09-29 DIAGNOSIS — G20 Parkinson's disease: Secondary | ICD-10-CM | POA: Diagnosis not present

## 2017-09-30 DIAGNOSIS — R252 Cramp and spasm: Secondary | ICD-10-CM | POA: Diagnosis not present

## 2017-09-30 DIAGNOSIS — G20 Parkinson's disease: Secondary | ICD-10-CM | POA: Diagnosis not present

## 2017-10-02 ENCOUNTER — Telehealth: Payer: Self-pay | Admitting: Rheumatology

## 2017-10-02 NOTE — Progress Notes (Signed)
Office Visit Note  Patient: Dawn Harrell             Date of Birth: 27-Feb-1946           MRN: [id number]             PCP: Melony Overly, MD Referring: Melony Overly, MD Visit Date: 10/03/2017 Occupation: @GUAROCC @    Subjective:  Bilateral feet pain   History of Present Illness: Dawn Harrell is a 72 y.o. female with history of seropositive rheumatoid arthritis and osteoarthritis.  Patient is on Plaquenil 200 mg by mouth twice daily Monday through Friday.  Her next Plaquenil eye exam is scheduled for 12/14/2017.  She has not missed any recent doses.  She denies any rheumatoid arthritis flares recently.  She denies any pain in her hands.  She denies any joint swelling at this time.  She states her knee joints have been doing well.  She denies any knee swelling.  She has occasional discomfort when she is climbing steps.  She is been having increased discomfort in her bilateral feet that is read as having an been getting worse.  She states that she has at least one episode of cramping in her bilateral feet a day.  She denies any other muscle cramps or muscle spasms.  She states that she has been trying to wear supportive she has been especially if she is walking long distances.  She states that she is also having some stiffness in her bilateral feet.  She denies any joint swelling.   Activities of Daily Living:  Patient reports morning stiffness for 2 minutes.   Patient Reports nocturnal pain.  Difficulty dressing/grooming: Denies Difficulty climbing stairs: Reports Difficulty getting out of chair: Reports Difficulty using hands for taps, buttons, cutlery, and/or writing: Reports   Review of Systems  Constitutional: Negative for fatigue.  HENT: Positive for mouth dryness. Negative for mouth sores and nose dryness.   Eyes: Negative for pain, visual disturbance and dryness.  Respiratory: Negative for cough, hemoptysis, shortness of breath and difficulty breathing.     Cardiovascular: Negative for chest pain, palpitations, hypertension and swelling in legs/feet.  Gastrointestinal: Positive for constipation. Negative for blood in stool and diarrhea.  Endocrine: Negative for increased urination.  Genitourinary: Negative for painful urination.  Musculoskeletal: Positive for arthralgias, joint pain and joint swelling. Negative for myalgias, muscle weakness, morning stiffness, muscle tenderness and myalgias.  Skin: Negative for color change, pallor, rash, hair loss, nodules/bumps, skin tightness, ulcers and sensitivity to sunlight.  Allergic/Immunologic: Negative for susceptible to infections.  Neurological: Negative for dizziness, numbness, headaches and weakness.  Hematological: Negative for swollen glands.  Psychiatric/Behavioral: Positive for sleep disturbance. Negative for depressed mood. The patient is not nervous/anxious.     PMFS History:  Patient Active Problem List   Diagnosis Date Noted  . Chondromalacia of both patellae 10/04/2016  . History of tremor/ Parkinsons dx.  10/04/2016  . History of TIA (transient ischemic attack) 10/04/2016  . History of hyperlipidemia 10/04/2016  . Tremor 03/24/2016  . Rheumatoid arthritis involving multiple sites with positive rheumatoid factor ([place]) 03/24/2016  . Primary osteoarthritis of both hands 03/24/2016  . Primary osteoarthritis of both knees 03/24/2016  . Primary osteoarthritis of both feet 03/24/2016  . High risk medication use 03/24/2016    Past Medical History:  Diagnosis Date  . Arthritis   . Parkinson's disease ([place])   . skin cancer     Family History  Problem Relation Age of Onset  .  Stroke Mother   . Heart attack Father   . Rheum arthritis Sister   . Lung disease Brother   . Diabetes Maternal Grandmother   . Diabetes Paternal Grandmother   . Leukemia Sister    Past Surgical History:  Procedure Laterality Date  . ABDOMINAL HYSTERECTOMY    . bladder tack    . SKIN CANCER EXCISION   11/2015   Social History   Social History Narrative  . Not on file     Objective: Vital Signs: BP 126/81 (BP Location: Right Arm, Patient Position: Sitting, Cuff Size: Normal)   Pulse 71   Resp 15   Ht 5\' 1"  (1.549 m)   Wt 141 lb (64 kg)   BMI 26.64 kg/m    Physical Exam  Constitutional: She is oriented to person, place, and time. She appears well-developed and well-nourished.  HENT:  Head: Normocephalic and atraumatic.  Eyes: Conjunctivae and EOM are normal.  Neck: Normal range of motion.  Cardiovascular: Normal rate, regular rhythm, normal heart sounds and intact distal pulses.  Pulmonary/Chest: Effort normal and breath sounds normal.  Abdominal: Soft. Bowel sounds are normal.  Lymphadenopathy:    She has no cervical adenopathy.  Neurological: She is alert and oriented to person, place, and time.  Skin: Skin is warm and dry. Capillary refill takes less than 2 seconds.  Psychiatric: She has a normal mood and affect. Her behavior is normal.  Nursing note and vitals reviewed.    Musculoskeletal Exam: C-spine, thoracic spine, and lumbar spine good ROM.  No midline spinal tenderness.  No SI joint tenderness.  Shoulder joints, elbow joints, wrist joints, MCPs, PIPs, DIPs good range of motion with no synovitis.  She has PIP and DIP synovial thickening consistent with osteoarthritis of bilateral hands.  She has synovial thickening of bilateral CMC joints but no tenderness.  Hip joints, knee joints, ankle joints, MTPs, PIPs, DIPs good range of motion with no synovitis.  No warmth or effusion of bilateral knee joints.  No knee crepitus.  No tenderness of trochanteric bursa.  She has PIP and DIP synovial thickening consistent with osteoarthritis of bilateral feet.  She has bunions on first MTP joints bilaterally.  She is no tenderness of MTP joints. No tenderness along the ankle joint line bilaterally.   CDAI Exam: CDAI Homunculus Exam:   Joint Counts:  CDAI Tender Joint count:  0 CDAI Swollen Joint count: 0  Global Assessments:  Patient Global Assessment: 4 Provider Global Assessment: 4  CDAI Calculated Score: 8    Investigation: No additional findings. CBC Latest Ref Rng & Units 08/15/2017 03/16/2017 09/19/2016  WBC 3.8 - 10.8 Thousand/uL 6.2 5.3 7.6  Hemoglobin 11.7 - 15.5 g/dL 12.8 12.6 13.8  Hematocrit 35.0 - 45.0 % 38.3 37.5 40.5  Platelets 140 - 400 Thousand/uL 229 253 234   CMP Latest Ref Rng & Units 08/15/2017 03/16/2017 09/19/2016  Glucose 65 - 99 mg/dL 87 84 92  BUN 7 - 25 mg/dL 14 12 12   Creatinine 0.60 - 0.93 mg/dL 0.68 0.73 0.79  Sodium 135 - 146 mmol/L 138 134(L) 135  Potassium 3.5 - 5.3 mmol/L 4.1 4.0 4.0  Chloride 98 - 110 mmol/L 99 99 95(L)  CO2 20 - 32 mmol/L 32 28 26  Calcium 8.6 - 10.4 mg/dL 9.2 9.1 9.4  Total Protein 6.1 - 8.1 g/dL 6.0(L) 5.8(L) 6.3  Total Bilirubin 0.2 - 1.2 mg/dL 0.6 0.6 0.8  Alkaline Phos 39 - 117 IU/L - - 87  AST 10 -  35 U/L 23 23 22   ALT 6 - 29 U/L 17 10 24      Imaging: No results found.  Speciality Comments: PLQ Eye Exam: 12/14/16 WNL with Dr. Raelyn Ensign    Procedures:  No procedures performed Allergies: Gabapentin   Assessment / Plan:     Visit Diagnoses: Rheumatoid arthritis involving multiple sites with positive rheumatoid factor ([place]): She has no active synovitis.  She has not had any recent rheumatoid arthritis flares.  She has no hand pain or joint swelling.  She has been having increased discomfort and cramping in bilateral feet.  Her bilateral feet pain is likely due to osteoarthritis.  She has PIP and DIP synovial thickening consistent with osteoarthritis of bilateral feet.  She will continue on Plaquenil 20 mg twice daily Monday through Friday.  She does not need any refills at this time.  High risk medication use - PLQ 200 mg by mouth BID M-F. PLQ Eye Exam: 12/14/16 WNL with Dr. Raelyn Ensign.  Her next back and eye exam is scheduled for 12/14/2017.  She was given a Plaquenil eye exam form  today in the office.  She has lab work on 08/15/17   Primary osteoarthritis of both hands: She has PIP and DIP synovial thickening consistent with osteoarthritis.  Joint protection and muscle strengthening were discussed.    Primary osteoarthritis of both knees-Chondromalacia of both patellae: No warmth or effusion of knee joints.  She experiences discomfort in bilateral knee joints especially when climbing steps.  Primary osteoarthritis of both feet: She has PIP and DIP synovial thickening consistent with osteoarthritis of bilateral feet.  She is overcrowding of her toes.  We suggested that she try wearing toe spacers.  The importance of wearing proper fitting shoes was also discussed.  We also recommended trying to take magnesium malate 250 mg at bedtime to help with muscle cramps.  If she tolerates the dose of 250 mg she can increase to 500 mg at bedtime.  Potential side effects were discussed.  We discussed a referral to podiatry but she declined at this time.  Other medical conditions are listed as follows:   History of TIA (transient ischemic attack)  History of hyperlipidemia    Orders: No orders of the defined types were placed in this encounter.  No orders of the defined types were placed in this encounter.   Face-to-face time spent with patient was 30 minutes. >50% of time was spent in counseling and coordination of care.  Follow-Up Instructions: Return in about 5 months (around 03/05/2018) for Rheumatoid arthritis, Osteoarthritis.   Ofilia Neas, PA-C  Note - This record has been created using Dragon software.  Chart creation errors have been sought, but may not always  have been located. Such creation errors do not reflect on  the standard of medical care.

## 2017-10-02 NOTE — Telephone Encounter (Signed)
Patient called stating she went to [facility] over the weekend due to pain in her toes.  Patient states they gave her Ativan which didn't help so they gave her Morphine.  Patient states her toes overlap and she is requesting a muscle relaxer.  Patient is available to see Lovena Le today if an appointment is what is recommended.

## 2017-10-02 NOTE — Telephone Encounter (Signed)
Patient called stating she was returning your call.   °

## 2017-10-02 NOTE — Telephone Encounter (Signed)
Attempted to contact the patient and left message for patient to call the office.  

## 2017-10-03 ENCOUNTER — Ambulatory Visit: Payer: Medicare HMO | Admitting: Physician Assistant

## 2017-10-03 ENCOUNTER — Encounter: Payer: Self-pay | Admitting: Physician Assistant

## 2017-10-03 VITALS — BP 126/81 | HR 71 | Resp 15 | Ht 61.0 in | Wt 141.0 lb

## 2017-10-03 DIAGNOSIS — M19042 Primary osteoarthritis, left hand: Secondary | ICD-10-CM

## 2017-10-03 DIAGNOSIS — Z8673 Personal history of transient ischemic attack (TIA), and cerebral infarction without residual deficits: Secondary | ICD-10-CM

## 2017-10-03 DIAGNOSIS — M19041 Primary osteoarthritis, right hand: Secondary | ICD-10-CM

## 2017-10-03 DIAGNOSIS — Z8639 Personal history of other endocrine, nutritional and metabolic disease: Secondary | ICD-10-CM

## 2017-10-03 DIAGNOSIS — M2242 Chondromalacia patellae, left knee: Secondary | ICD-10-CM

## 2017-10-03 DIAGNOSIS — M0579 Rheumatoid arthritis with rheumatoid factor of multiple sites without organ or systems involvement: Secondary | ICD-10-CM

## 2017-10-03 DIAGNOSIS — M2241 Chondromalacia patellae, right knee: Secondary | ICD-10-CM | POA: Diagnosis not present

## 2017-10-03 DIAGNOSIS — Z79899 Other long term (current) drug therapy: Secondary | ICD-10-CM

## 2017-10-03 DIAGNOSIS — M19071 Primary osteoarthritis, right ankle and foot: Secondary | ICD-10-CM

## 2017-10-03 DIAGNOSIS — M170 Bilateral primary osteoarthritis of knee: Secondary | ICD-10-CM | POA: Diagnosis not present

## 2017-10-03 DIAGNOSIS — M19072 Primary osteoarthritis, left ankle and foot: Secondary | ICD-10-CM

## 2017-10-03 NOTE — Telephone Encounter (Signed)
Patient has an appointment with Lovena Le for bilateral feet.

## 2017-10-03 NOTE — Patient Instructions (Addendum)
Magnesium Malate 250 mg at bedtime for muscle cramps  You can increased to Magnesium Malate 500 mg at bedtime if you tolerate the lower dose.   Potential side effects include drowsiness and diarrhea

## 2017-10-04 ENCOUNTER — Telehealth: Payer: Self-pay | Admitting: Rheumatology

## 2017-10-04 NOTE — Telephone Encounter (Signed)
Attempted to contact the patient and left message for patient to call the office.  

## 2017-10-04 NOTE — Telephone Encounter (Signed)
Patient called stating she was returning your call.   °

## 2017-11-01 DIAGNOSIS — M898X1 Other specified disorders of bone, shoulder: Secondary | ICD-10-CM | POA: Diagnosis not present

## 2017-11-01 DIAGNOSIS — E663 Overweight: Secondary | ICD-10-CM | POA: Diagnosis not present

## 2017-11-15 DIAGNOSIS — E663 Overweight: Secondary | ICD-10-CM | POA: Diagnosis not present

## 2017-11-15 DIAGNOSIS — M62830 Muscle spasm of back: Secondary | ICD-10-CM | POA: Diagnosis not present

## 2017-11-17 DIAGNOSIS — M545 Low back pain: Secondary | ICD-10-CM | POA: Diagnosis not present

## 2017-11-21 DIAGNOSIS — M545 Low back pain: Secondary | ICD-10-CM | POA: Diagnosis not present

## 2017-11-23 DIAGNOSIS — M545 Low back pain: Secondary | ICD-10-CM | POA: Diagnosis not present

## 2017-11-28 DIAGNOSIS — M545 Low back pain: Secondary | ICD-10-CM | POA: Diagnosis not present

## 2017-11-29 DIAGNOSIS — Z139 Encounter for screening, unspecified: Secondary | ICD-10-CM | POA: Diagnosis not present

## 2017-11-29 DIAGNOSIS — Z0000 Encounter for general adult medical examination without abnormal findings: Secondary | ICD-10-CM | POA: Diagnosis not present

## 2017-11-29 DIAGNOSIS — Z9181 History of falling: Secondary | ICD-10-CM | POA: Diagnosis not present

## 2017-11-29 DIAGNOSIS — Z1331 Encounter for screening for depression: Secondary | ICD-10-CM | POA: Diagnosis not present

## 2017-11-29 DIAGNOSIS — Z1231 Encounter for screening mammogram for malignant neoplasm of breast: Secondary | ICD-10-CM | POA: Diagnosis not present

## 2017-11-29 DIAGNOSIS — N959 Unspecified menopausal and perimenopausal disorder: Secondary | ICD-10-CM | POA: Diagnosis not present

## 2017-11-29 DIAGNOSIS — E785 Hyperlipidemia, unspecified: Secondary | ICD-10-CM | POA: Diagnosis not present

## 2017-11-29 DIAGNOSIS — Z1339 Encounter for screening examination for other mental health and behavioral disorders: Secondary | ICD-10-CM | POA: Diagnosis not present

## 2017-11-30 DIAGNOSIS — M545 Low back pain: Secondary | ICD-10-CM | POA: Diagnosis not present

## 2017-12-05 DIAGNOSIS — M545 Low back pain: Secondary | ICD-10-CM | POA: Diagnosis not present

## 2017-12-07 DIAGNOSIS — M545 Low back pain: Secondary | ICD-10-CM | POA: Diagnosis not present

## 2017-12-08 DIAGNOSIS — T63301A Toxic effect of unspecified spider venom, accidental (unintentional), initial encounter: Secondary | ICD-10-CM | POA: Diagnosis not present

## 2017-12-12 DIAGNOSIS — Z1339 Encounter for screening examination for other mental health and behavioral disorders: Secondary | ICD-10-CM | POA: Diagnosis not present

## 2017-12-12 DIAGNOSIS — Z1331 Encounter for screening for depression: Secondary | ICD-10-CM | POA: Diagnosis not present

## 2017-12-12 DIAGNOSIS — E785 Hyperlipidemia, unspecified: Secondary | ICD-10-CM | POA: Diagnosis not present

## 2017-12-12 DIAGNOSIS — T63301A Toxic effect of unspecified spider venom, accidental (unintentional), initial encounter: Secondary | ICD-10-CM | POA: Diagnosis not present

## 2017-12-12 DIAGNOSIS — Z9181 History of falling: Secondary | ICD-10-CM | POA: Diagnosis not present

## 2017-12-12 DIAGNOSIS — R251 Tremor, unspecified: Secondary | ICD-10-CM | POA: Diagnosis not present

## 2017-12-12 DIAGNOSIS — M05761 Rheumatoid arthritis with rheumatoid factor of right knee without organ or systems involvement: Secondary | ICD-10-CM | POA: Diagnosis not present

## 2017-12-12 DIAGNOSIS — L989 Disorder of the skin and subcutaneous tissue, unspecified: Secondary | ICD-10-CM | POA: Diagnosis not present

## 2017-12-14 DIAGNOSIS — M545 Low back pain: Secondary | ICD-10-CM | POA: Diagnosis not present

## 2017-12-19 DIAGNOSIS — H40013 Open angle with borderline findings, low risk, bilateral: Secondary | ICD-10-CM | POA: Diagnosis not present

## 2017-12-19 DIAGNOSIS — Z0100 Encounter for examination of eyes and vision without abnormal findings: Secondary | ICD-10-CM | POA: Diagnosis not present

## 2017-12-19 DIAGNOSIS — H5203 Hypermetropia, bilateral: Secondary | ICD-10-CM | POA: Diagnosis not present

## 2017-12-19 DIAGNOSIS — Z79899 Other long term (current) drug therapy: Secondary | ICD-10-CM | POA: Diagnosis not present

## 2017-12-19 DIAGNOSIS — H524 Presbyopia: Secondary | ICD-10-CM | POA: Diagnosis not present

## 2017-12-19 DIAGNOSIS — H25813 Combined forms of age-related cataract, bilateral: Secondary | ICD-10-CM | POA: Diagnosis not present

## 2017-12-19 DIAGNOSIS — H47233 Glaucomatous optic atrophy, bilateral: Secondary | ICD-10-CM | POA: Diagnosis not present

## 2017-12-19 DIAGNOSIS — H52223 Regular astigmatism, bilateral: Secondary | ICD-10-CM | POA: Diagnosis not present

## 2017-12-21 DIAGNOSIS — M545 Low back pain: Secondary | ICD-10-CM | POA: Diagnosis not present

## 2017-12-27 DIAGNOSIS — M545 Low back pain: Secondary | ICD-10-CM | POA: Diagnosis not present

## 2017-12-27 DIAGNOSIS — Z1231 Encounter for screening mammogram for malignant neoplasm of breast: Secondary | ICD-10-CM | POA: Diagnosis not present

## 2018-01-01 DIAGNOSIS — G4710 Hypersomnia, unspecified: Secondary | ICD-10-CM | POA: Diagnosis not present

## 2018-01-09 NOTE — Progress Notes (Signed)
Office Visit Note  Patient: Dawn Harrell             Date of Birth: 06-04-1945           MRN: [id number]             PCP: Melony Overly, MD Referring: Melony Overly, MD Visit Date: 01/23/2018 Occupation: @GUAROCC @  Subjective:  Right foot and ankle pain   History of Present Illness: Dawn Harrell is a 72 y.o. female with history of seropositive rheumatoid arthritis and osteoarthritis.  She is on PLQ 200 mg BID M-F.  She reports that she has not missed any doses of Plaquenil recently.  She states that she has pain and swelling in her right foot and ankle on a daily basis.  She states that she has fallen 3 times in the past 1 month.  She states that she loses her balance but denies any dizziness.  She states that she recently completed physical therapy for her lower back.  She was encouraged to start using her cane on a daily basis.  She reports that she has not gotten her flu shot yet but she is following up with her primary care tomorrow.  She denies any other joint pain or joint swelling at this time.    Activities of Daily Living:  Patient reports morning stiffness for 2  minutes.   Patient Reports nocturnal pain.  Difficulty dressing/grooming: Denies Difficulty climbing stairs: Reports Difficulty getting out of chair: Reports Difficulty using hands for taps, buttons, cutlery, and/or writing: Reports  Review of Systems  Constitutional: Negative for fatigue.  HENT: Negative for mouth sores, mouth dryness and nose dryness.   Eyes: Negative for pain, visual disturbance and dryness.  Respiratory: Negative for cough, hemoptysis, shortness of breath and difficulty breathing.   Cardiovascular: Negative for chest pain, palpitations, hypertension and swelling in legs/feet.  Gastrointestinal: Negative for blood in stool, constipation and diarrhea.  Endocrine: Negative for increased urination.  Genitourinary: Negative for painful urination.  Musculoskeletal: Positive for  arthralgias, joint pain, joint swelling and morning stiffness. Negative for myalgias, muscle weakness, muscle tenderness and myalgias.  Skin: Negative for color change, pallor, rash, hair loss, nodules/bumps, skin tightness, ulcers and sensitivity to sunlight.  Allergic/Immunologic: Negative for susceptible to infections.  Neurological: Positive for tremors. Negative for dizziness, numbness, headaches and weakness.  Hematological: Negative for swollen glands.  Psychiatric/Behavioral: Positive for depressed mood. Negative for sleep disturbance. The patient is not nervous/anxious.     PMFS History:  Patient Active Problem List   Diagnosis Date Noted  . Chondromalacia of both patellae 10/04/2016  . History of tremor/ Parkinsons dx.  10/04/2016  . History of TIA (transient ischemic attack) 10/04/2016  . History of hyperlipidemia 10/04/2016  . Tremor 03/24/2016  . Rheumatoid arthritis involving multiple sites with positive rheumatoid factor ([place]) 03/24/2016  . Primary osteoarthritis of both hands 03/24/2016  . Primary osteoarthritis of both knees 03/24/2016  . Primary osteoarthritis of both feet 03/24/2016  . High risk medication use 03/24/2016    Past Medical History:  Diagnosis Date  . Arthritis   . Parkinson's disease ([place])   . skin cancer     Family History  Problem Relation Age of Onset  . Stroke Mother   . Heart attack Father   . Rheum arthritis Sister   . Lung disease Brother   . Diabetes Maternal Grandmother   . Diabetes Paternal Grandmother   . Leukemia Sister    Past Surgical History:  Procedure  Laterality Date  . ABDOMINAL HYSTERECTOMY    . bladder tack    . SKIN CANCER EXCISION  11/2015   Social History   Social History Narrative  . Not on file    Objective: Vital Signs: BP (!) 153/87 (BP Location: Left Arm, Patient Position: Sitting, Cuff Size: Normal)   Pulse 74   Resp 13   Ht 5\' 1"  (1.549 m)   Wt 141 lb (64 kg)   BMI 26.64 kg/m    Physical Exam    Constitutional: She is oriented to person, place, and time. She appears well-developed and well-nourished.  HENT:  Head: Normocephalic and atraumatic.  Eyes: Conjunctivae and EOM are normal.  Neck: Normal range of motion.  Cardiovascular: Normal rate, regular rhythm, normal heart sounds and intact distal pulses.  Pulmonary/Chest: Effort normal and breath sounds normal.  Abdominal: Soft. Bowel sounds are normal.  Lymphadenopathy:    She has no cervical adenopathy.  Neurological: She is alert and oriented to person, place, and time.  Skin: Skin is warm and dry. Capillary refill takes less than 2 seconds.  Psychiatric: She has a normal mood and affect. Her behavior is normal.  Nursing note and vitals reviewed.    Musculoskeletal Exam: C-spine, thoracic spine, and lumbar spine good ROM.  No midline spinal tenderness.  No SI joint tenderness.  Shoulder joints, elbow joints, wrist joints, MCPs and PIPs, DIPs good range of motion no synovitis.  She has PIP and DIP synovial thickening consistent with osteoarthritis.  She has bilateral CMC joint synovial thickening.  She is complete fist formation bilaterally.  Hip joints good range of motion with no discomfort.  Knee joints good range of motion no warmth or effusion noted.  She has tenderness and swelling of her right ankle joint.  Erythema noted on the dorsal aspect of right foot.  Tenderness of MTP and PIP joints of the right foot.  No Achilles tendinitis or plantar fasciitis.  Left ankle good range of motion with no warmth or effusion noted.  No tenderness of MTP or PIP joints of the left foot.  CDAI Exam: CDAI Score: 0.6  Patient Global Assessment: 3 (mm); Provider Global Assessment: 3 (mm) Swollen: 0 ; Tender: 0  Joint Exam   Not documented   There is currently no information documented on the homunculus. Go to the Rheumatology activity and complete the homunculus joint exam.  Investigation: No additional findings.  Imaging: No results  found.  Recent Labs: Lab Results  Component Value Date   WBC 6.2 08/15/2017   HGB 12.8 08/15/2017   PLT 229 08/15/2017   NA 138 08/15/2017   K 4.1 08/15/2017   CL 99 08/15/2017   CO2 32 08/15/2017   GLUCOSE 87 08/15/2017   BUN 14 08/15/2017   CREATININE 0.68 08/15/2017   BILITOT 0.6 08/15/2017   ALKPHOS 87 09/19/2016   AST 23 08/15/2017   ALT 17 08/15/2017   PROT 6.0 (L) 08/15/2017   ALBUMIN 4.2 09/19/2016   CALCIUM 9.2 08/15/2017   GFRAA 102 08/15/2017    Speciality Comments: PLQ Eye Exam: 12/19/17 WNL @ [facility] up in 1 year Immunosupressant Lab Work: TB gold-negative 09/04/13 Hepatitis panel- negative except Hep A IgM antibody 09/04/13  Procedures:  No procedures performed Allergies: Gabapentin   Assessment / Plan:     Visit Diagnoses: Rheumatoid arthritis involving multiple sites with positive rheumatoid factor ([place]) -She has right ankle swelling, warmth and tenderness on exam.  She has tenderness of MTP and  PIP joints of the right foot.  She has diffuse erythema of the dorsal aspect of the right foot.  X-rays of the right foot and ankle were obtained today.  She is taking Plaquenil 200 mg 1 tablet twice daily Monday through Friday.  She has not missed any doses of Plaquenil recently.  She has no other joint pain or joint swelling at this time.  We discussed adding on methotrexate 4 tablets by mouth once weekly to her current treatment regimen.  The indications, contraindications, potential side effects of methotrexate were discussed.  Consent was obtained today.  All questions were addressed.  Plan: XR Foot 2 Views Right, XR Ankle Complete Right  Drug Counseling TB Gold: 05/15 Hepatitis panel: 05/15  Chest-xray:  Ordered   Contraception: Post-menopausal   Alcohol use: She does not drink alcohol.   Patient was counseled on the purpose, proper use, and adverse effects of methotrexate including nausea, infection, and signs and symptoms of pneumonitis.   Reviewed instructions with patient to take methotrexate weekly along with folic acid daily.  Discussed the importance of frequent monitoring of kidney and liver function and blood counts, and provided patient with standing lab instructions.  Counseled patient to avoid NSAIDs and alcohol while on methotrexate.  Provided patient with educational materials on methotrexate and answered all questions.  Advised patient to get annual influenza vaccine and to get a pneumococcal vaccine if patient has not already had one.  Patient voiced understanding.  Patient consented to methotrexate use.  Will upload into chart.    High risk medication use - PLQ. eye exam: 12/19/2017.  CBC and CMP will be drawn today to monitor for drug toxicity.- Plan: CBC with Differential/Platelet, COMPLETE METABOLIC PANEL WITH GFR  Pain in right ankle and joints of right foot -Right ankle joint swelling and tenderness noted on exam.  She has tenderness of MTP and PIP joints of the right foot.  Erythema noted on the dorsal aspect of the right foot.  X-ray of the right ankle and right foot were obtained today.  We also discussed adding on methotrexate 4 tablets by mouth once weekly to her current treatment regimen of Plaquenil 200 mg 1 tablet twice daily Monday through Friday.  Plan: XR Foot 2 Views Right, XR Ankle Complete Right   Primary osteoarthritis of both hands: PIP and DIP synovial thickening consistent with osteoarthritis of bilateral hands.  Bilateral CMC joint synovial thickening.  She is complete fist formation bilaterally.  She is no discomfort in her hands at this time.  Joint protection and muscle strengthening were discussed.  Primary osteoarthritis of both knees: No warmth or effusion of knee joints. No discomfort at this time.   Chondromalacia of both patellae  Primary osteoarthritis of both feet: She has PIP and DIP synovial thickening consistent with osteoarthritis of bilateral feet.  She has tenderness of PIP joints.  She  is been having increased discomfort in her right foot.  A x-ray of the right foot was obtained today.  Other medical conditions are listed as follows:  History of hyperlipidemia  History of tremor/ Parkinsons dx.   History of TIA (transient ischemic attack)   Orders: Orders Placed This Encounter  Procedures  . XR Foot 2 Views Right  . XR Ankle Complete Right  . CBC with Differential/Platelet  . COMPLETE METABOLIC PANEL WITH GFR   No orders of the defined types were placed in this encounter.   Face-to-face time spent with patient was 30 minutes. Greater than 50% of  time was spent in counseling and coordination of care.  Follow-Up Instructions: Return for Rheumatoid arthritis, Osteoarthritis.   Ofilia Neas, PA-C   I examined and evaluated the patient with Hazel Sams PA.On my exam   patient had synovitis in her right ankle and right foot.  The x-rays today obtained were unremarkable.  She is not having good control of her rheumatoid arthritis with Plaquenil monotherapy.  I discussed different treatment options and their side effects were discussed at length.  She wanted to proceed with methotrexate.  We will obtain some baseline labs today.  Once the labs are available we will start her on methotrexate at 4 tablets p.o. weekly along with folic acid.  We will monitor her labs in 2 weeks, 2 months and then every 3 months.  The plan of care was discussed as noted above.  Bo Merino, MD Note - This record has been created using Editor, commissioning.  Chart creation errors have been sought, but may not always  have been located. Such creation errors do not reflect on  the standard of medical care.

## 2018-01-23 ENCOUNTER — Ambulatory Visit (INDEPENDENT_AMBULATORY_CARE_PROVIDER_SITE_OTHER): Payer: Medicare HMO

## 2018-01-23 ENCOUNTER — Ambulatory Visit (HOSPITAL_COMMUNITY)
Admission: RE | Admit: 2018-01-23 | Discharge: 2018-01-23 | Disposition: A | Discharge status: Home / Self Care | Payer: Medicare HMO | Source: Ambulatory Visit | Attending: Physician Assistant | Admitting: Physician Assistant

## 2018-01-23 ENCOUNTER — Encounter: Payer: Self-pay | Admitting: Physician Assistant

## 2018-01-23 ENCOUNTER — Ambulatory Visit: Payer: Medicare HMO | Admitting: Rheumatology

## 2018-01-23 VITALS — BP 153/87 | HR 74 | Resp 13 | Ht 61.0 in | Wt 141.0 lb

## 2018-01-23 DIAGNOSIS — M2242 Chondromalacia patellae, left knee: Secondary | ICD-10-CM

## 2018-01-23 DIAGNOSIS — M059 Rheumatoid arthritis with rheumatoid factor, unspecified: Secondary | ICD-10-CM | POA: Insufficient documentation

## 2018-01-23 DIAGNOSIS — M19071 Primary osteoarthritis, right ankle and foot: Secondary | ICD-10-CM

## 2018-01-23 DIAGNOSIS — M170 Bilateral primary osteoarthritis of knee: Secondary | ICD-10-CM | POA: Diagnosis not present

## 2018-01-23 DIAGNOSIS — M0579 Rheumatoid arthritis with rheumatoid factor of multiple sites without organ or systems involvement: Secondary | ICD-10-CM | POA: Diagnosis not present

## 2018-01-23 DIAGNOSIS — Z79899 Other long term (current) drug therapy: Secondary | ICD-10-CM | POA: Insufficient documentation

## 2018-01-23 DIAGNOSIS — M2241 Chondromalacia patellae, right knee: Secondary | ICD-10-CM | POA: Diagnosis not present

## 2018-01-23 DIAGNOSIS — M19041 Primary osteoarthritis, right hand: Secondary | ICD-10-CM

## 2018-01-23 DIAGNOSIS — M25571 Pain in right ankle and joints of right foot: Secondary | ICD-10-CM

## 2018-01-23 DIAGNOSIS — Z8673 Personal history of transient ischemic attack (TIA), and cerebral infarction without residual deficits: Secondary | ICD-10-CM | POA: Diagnosis not present

## 2018-01-23 DIAGNOSIS — R0989 Other specified symptoms and signs involving the circulatory and respiratory systems: Secondary | ICD-10-CM | POA: Diagnosis not present

## 2018-01-23 DIAGNOSIS — M19072 Primary osteoarthritis, left ankle and foot: Secondary | ICD-10-CM

## 2018-01-23 DIAGNOSIS — Z8639 Personal history of other endocrine, nutritional and metabolic disease: Secondary | ICD-10-CM | POA: Diagnosis not present

## 2018-01-23 DIAGNOSIS — Z8669 Personal history of other diseases of the nervous system and sense organs: Secondary | ICD-10-CM | POA: Diagnosis not present

## 2018-01-23 DIAGNOSIS — M19042 Primary osteoarthritis, left hand: Secondary | ICD-10-CM

## 2018-01-23 NOTE — Progress Notes (Signed)
Pharmacy Note Subjective: Patient presents today to the [facility] to see Dr. Estanislado Pandy.   Patient seen by the pharmacist for counseling on Methotrexate.    Objective: TB Test: negative 09/04/13 Hepatitis panel: negative except Hep A IGM 09/04/13 HIV: pending SPEP: pending Immunoglobulins: pending  Chest x-ray: pending  CBC    Component Value Date/Time   WBC 6.2 08/15/2017 0959   RBC 4.72 08/15/2017 0959   HGB 12.8 08/15/2017 0959   HGB 13.8 09/19/2016 0908   HCT 38.3 08/15/2017 0959   HCT 40.5 09/19/2016 0908   PLT 229 08/15/2017 0959   PLT 234 09/19/2016 0908   MCV 81.1 08/15/2017 0959   MCV 83 09/19/2016 0908   MCH 27.1 08/15/2017 0959   MCHC 33.4 08/15/2017 0959   RDW 13.1 08/15/2017 0959   RDW 13.6 09/19/2016 0908   LYMPHSABS 2,269 08/15/2017 0959   LYMPHSABS 2.1 09/19/2016 0908   EOSABS 167 08/15/2017 0959   EOSABS 0.1 09/19/2016 0908   BASOSABS 81 08/15/2017 0959   BASOSABS 0.1 09/19/2016 0908      Assessment/Plan:  Counseled patient of proper use, storage,  and dosing of Methotrexate. Counseled on common side effects of nausea and vomiting and  ways to minimize them such as taking in the evening with a meal and folic acid supplement.   Counseled patient on avoiding live vaccines, discontinuing methotrexate for surgical procedures, and any time patient is placed on Antibiotics. Patient states that she has had Prevnar 13, 1 Pneumovax 23, Zostavax and Hep B.  Encouraged patient to check with PCP about another dose of Pneumovax 2 and Shingrix.  Also counseled on the importance of lab monitor every 3 months.  Standing labs ordered for patient is to comeback in 2 weeks, then 4 weeks, then every 3 months.   Patient consented to Methotrexate. Will upload consent in the media tab. Standing orders placed for lab monitor all questions were encouraged and answered.   Instructed patient to obtain Chest Xray at [facility].  Pending labs the patient  will start methotrexate 15 mg along with 2 mg of folic acid.  Will send prescription once labs result.  Mariella Saa, PharmD, [facility] Clinical Pharmacist  01/23/2018 9:23 AM

## 2018-01-23 NOTE — Progress Notes (Signed)
CXR did not reveal active cardiopulmonary disease.

## 2018-01-23 NOTE — Patient Instructions (Addendum)
Standing Labs We placed an order today for your standing lab work.    Please come back and get your standing labs in 2 weeks and then 4 weeks then every 3 months.  We have open lab Monday through Friday from 8:30-11:30 AM and 1:30-4:00 PM  at the office of Dr. Bo Merino.   You may experience shorter wait times on Monday and Friday afternoons. The office is located at [address] [place], [place], [place] [address] No appointment is necessary.   Labs are drawn by [facility].  You may receive a bill from [facility] for your lab work. If you have any questions regarding directions or hours of operation,  please call [phone].   Just as a reminder please drink plenty of water prior to coming for your lab work. Thanks!  Vaccines You are taking a medication(s) that can suppress your immune system.  The following immunizations are recommended:  . Flu annually . Pneumonia (you may need another dose of Pneumovax 23) . Shingrix . Hepatitis B  Please check with your PCP to make sure you are up to date.   Methotrexate tablets What is this medicine? METHOTREXATE (METH oh TREX ate) is a chemotherapy drug used to treat cancer including breast cancer, leukemia, and lymphoma. This medicine can also be used to treat psoriasis and certain kinds of arthritis. This medicine may be used for other purposes; ask your health care provider or pharmacist if you have questions. COMMON BRAND NAME(S): Rheumatrex, Trexall What should I tell my health care provider before I take this medicine? They need to know if you have any of these conditions: -fluid in the stomach area or lungs -if you often drink alcohol -infection or immune system problems -kidney disease or on hemodialysis -liver disease -low blood counts, like low white cell, platelet, or red cell counts -lung disease -radiation therapy -stomach ulcers -ulcerative colitis -an unusual or allergic reaction to methotrexate, other  medicines, foods, dyes, or preservatives -pregnant or trying to get pregnant -breast-feeding How should I use this medicine? Take this medicine by mouth with a glass of water. Follow the directions on the prescription label. Take your medicine at regular intervals. Do not take it more often than directed. Do not stop taking except on your doctor's advice. Make sure you know why you are taking this medicine and how often you should take it. If this medicine is used for a condition that is not cancer, like arthritis or psoriasis, it should be taken weekly, NOT daily. Taking this medicine more often than directed can cause serious side effects, even death. Talk to your healthcare provider about safe handling and disposal of this medicine. You may need to take special precautions. Talk to your pediatrician regarding the use of this medicine in children. While this drug may be prescribed for selected conditions, precautions do apply. Overdosage: If you think you have taken too much of this medicine contact a poison control center or emergency room at once. NOTE: This medicine is only for you. Do not share this medicine with others. What if I miss a dose? If you miss a dose, talk with your doctor or health care professional. Do not take double or extra doses. What may interact with this medicine? This medicine may interact with the following medication: -acitretin -aspirin and aspirin-like medicines including salicylates -azathioprine -certain antibiotics like penicillins, tetracycline, and chloramphenicol -cyclosporine -gold -hydroxychloroquine -live virus vaccines -NSAIDs, medicines for pain and inflammation, like ibuprofen or naproxen -other cytotoxic agents -penicillamine -  phenylbutazone -phenytoin -probenecid -retinoids such as isotretinoin and tretinoin -steroid medicines like prednisone or cortisone -sulfonamides like sulfasalazine and trimethoprim/sulfamethoxazole -theophylline This  list may not describe all possible interactions. Give your health care provider a list of all the medicines, herbs, non-prescription drugs, or dietary supplements you use. Also tell them if you smoke, drink alcohol, or use illegal drugs. Some items may interact with your medicine. What should I watch for while using this medicine? Avoid alcoholic drinks. This medicine can make you more sensitive to the sun. Keep out of the sun. If you cannot avoid being in the sun, wear protective clothing and use sunscreen. Do not use sun lamps or tanning beds/booths. You may need blood work done while you are taking this medicine. Call your doctor or health care professional for advice if you get a fever, chills or sore throat, or other symptoms of a cold or flu. Do not treat yourself. This drug decreases your body's ability to fight infections. Try to avoid being around people who are sick. This medicine may increase your risk to bruise or bleed. Call your doctor or health care professional if you notice any unusual bleeding. Check with your doctor or health care professional if you get an attack of severe diarrhea, nausea and vomiting, or if you sweat a lot. The loss of too much body fluid can make it dangerous for you to take this medicine. Talk to your doctor about your risk of cancer. You may be more at risk for certain types of cancers if you take this medicine. Both men and women must use effective birth control with this medicine. Do not become pregnant while taking this medicine or until at least 1 normal menstrual cycle has occurred after stopping it. Women should inform their doctor if they wish to become pregnant or think they might be pregnant. Men should not father a child while taking this medicine and for 3 months after stopping it. There is a potential for serious side effects to an unborn child. Talk to your health care professional or pharmacist for more information. Do not breast-feed an infant while  taking this medicine. What side effects may I notice from receiving this medicine? Side effects that you should report to your doctor or health care professional as soon as possible: -allergic reactions like skin rash, itching or hives, swelling of the face, lips, or tongue -breathing problems or shortness of breath -diarrhea -dry, nonproductive cough -low blood counts - this medicine may decrease the number of white blood cells, red blood cells and platelets. You may be at increased risk for infections and bleeding. -mouth sores -redness, blistering, peeling or loosening of the skin, including inside the mouth -signs of infection - fever or chills, cough, sore throat, pain or trouble passing urine -signs and symptoms of bleeding such as bloody or black, tarry stools; red or dark-brown urine; spitting up blood or brown material that looks like coffee grounds; red spots on the skin; unusual bruising or bleeding from the eye, gums, or nose -signs and symptoms of kidney injury like trouble passing urine or change in the amount of urine -signs and symptoms of liver injury like dark yellow or brown urine; general ill feeling or flu-like symptoms; light-colored stools; loss of appetite; nausea; right upper belly pain; unusually weak or tired; yellowing of the eyes or skin Side effects that usually do not require medical attention (report to your doctor or health care professional if they continue or are bothersome): -dizziness -hair loss -  tiredness -upset stomach -vomiting This list may not describe all possible side effects. Call your doctor for medical advice about side effects. You may report side effects to FDA at 1-800-FDA-1088. Where should I keep my medicine? Keep out of the reach of children. Store at room temperature between 20 and 25 degrees C (68 and 77 degrees F). Protect from light. Throw away any unused medicine after the expiration date. NOTE: This sheet is a summary. It may not  cover all possible information. If you have questions about this medicine, talk to your doctor, pharmacist, or health care provider.  2018 Elsevier/Gold Standard (2014-12-08 05:39:22)

## 2018-01-24 DIAGNOSIS — L989 Disorder of the skin and subcutaneous tissue, unspecified: Secondary | ICD-10-CM | POA: Diagnosis not present

## 2018-01-24 DIAGNOSIS — D0461 Carcinoma in situ of skin of right upper limb, including shoulder: Secondary | ICD-10-CM | POA: Diagnosis not present

## 2018-01-24 DIAGNOSIS — Z23 Encounter for immunization: Secondary | ICD-10-CM | POA: Diagnosis not present

## 2018-01-26 LAB — PROTEIN ELECTROPHORESIS, SERUM, WITH REFLEX
ALBUMIN ELP: 3.9 g/dL (ref 3.8–4.8)
ALPHA 1: 0.3 g/dL (ref 0.2–0.3)
Alpha 2: 0.6 g/dL (ref 0.5–0.9)
BETA 2: 0.2 g/dL (ref 0.2–0.5)
BETA GLOBULIN: 0.4 g/dL (ref 0.4–0.6)
Gamma Globulin: 0.6 g/dL — ABNORMAL LOW (ref 0.8–1.7)
Total Protein: 6 g/dL — ABNORMAL LOW (ref 6.1–8.1)

## 2018-01-26 LAB — QUANTIFERON-TB GOLD PLUS
Mitogen-NIL: 9.32 IU/mL
NIL: 0.04 IU/mL
QuantiFERON-TB Gold Plus: NEGATIVE
TB2-NIL: <0 IU/mL

## 2018-01-26 LAB — COMPLETE METABOLIC PANEL WITH GFR
AG RATIO: 1.9 (calc) (ref 1.0–2.5)
ALT: 11 U/L (ref 6–29)
AST: 20 U/L (ref 10–35)
Albumin: 3.8 g/dL (ref 3.6–5.1)
Alkaline phosphatase (APISO): 75 U/L (ref 33–130)
BILIRUBIN TOTAL: 0.4 mg/dL (ref 0.2–1.2)
BUN: 11 mg/dL (ref 7–25)
CHLORIDE: 101 mmol/L (ref 98–110)
CO2: 28 mmol/L (ref 20–32)
Calcium: 9.1 mg/dL (ref 8.6–10.4)
Creat: 0.76 mg/dL (ref 0.60–0.93)
GFR, Est African American: 91 mL/min/{1.73_m2} (ref >=60)
GFR, Est Non African American: 79 mL/min/{1.73_m2} (ref >=60)
Globulin: 2 g/dL (calc) (ref 1.9–3.7)
Glucose, Bld: 84 mg/dL (ref 65–99)
Potassium: 4.6 mmol/L (ref 3.5–5.3)
Sodium: 137 mmol/L (ref 135–146)
Total Protein: 5.8 g/dL — ABNORMAL LOW (ref 6.1–8.1)

## 2018-01-26 LAB — CBC WITH DIFFERENTIAL/PLATELET
BASOS ABS: 68 {cells}/uL (ref 0–200)
Basophils Relative: 1.2 %
EOS ABS: 211 {cells}/uL (ref 15–500)
Eosinophils Relative: 3.7 %
HEMATOCRIT: 38.5 % (ref 35.0–45.0)
Hemoglobin: 12.7 g/dL (ref 11.7–15.5)
LYMPHS ABS: 1892 {cells}/uL (ref 850–3900)
MCH: 27.4 pg (ref 27.0–33.0)
MCHC: 33 g/dL (ref 32.0–36.0)
MCV: 83.2 fL (ref 80.0–100.0)
MPV: 10.8 fL (ref 7.5–12.5)
Monocytes Relative: 7.8 %
NEUTROS PCT: 54.1 %
Neutro Abs: 3084 cells/uL (ref 1500–7800)
Platelets: 258 10*3/uL (ref 140–400)
RBC: 4.63 10*6/uL (ref 3.80–5.10)
RDW: 13.2 % (ref 11.0–15.0)
TOTAL LYMPHOCYTE: 33.2 %
WBC: 5.7 10*3/uL (ref 3.8–10.8)
WBCMIX: 445 {cells}/uL (ref 200–950)

## 2018-01-26 LAB — IGG, IGA, IGM
IGG (IMMUNOGLOBIN G), SERUM: 563 mg/dL — AB (ref 600–1540)
IMMUNOGLOBULIN A: 142 mg/dL (ref 20–320)
IgM, Serum: 192 mg/dL (ref 50–300)

## 2018-01-26 LAB — HIV ANTIBODY (ROUTINE TESTING W REFLEX): HIV 1&2 Ab, 4th Generation: NONREACTIVE

## 2018-01-26 LAB — IFE INTERPRETATION: IMMUNOFIX ELECTR INT: NOT DETECTED

## 2018-01-29 ENCOUNTER — Telehealth: Payer: Self-pay | Admitting: *Deleted

## 2018-01-29 MED ORDER — METHOTREXATE 2.5 MG PO TABS: 10.0000 mg | ORAL_TABLET | ORAL | 0 refills | Status: DC

## 2018-01-29 MED ORDER — FOLIC ACID 1 MG PO TABS: 1.0000 mg | ORAL_TABLET | Freq: Every day | ORAL | 3 refills | Status: DC

## 2018-01-29 NOTE — Telephone Encounter (Signed)
-----   Message from Ofilia Neas, PA-C sent at 01/29/2018  8:09 AM EDT ----- CBC WNL. Total protein stable. IFE did not reveal any monoclonal proteins. HIV negative. TB gold negative.

## 2018-01-29 NOTE — Progress Notes (Signed)
CBC WNL. Total protein stable. IFE did not reveal any monoclonal proteins. HIV negative. TB gold negative.

## 2018-01-29 NOTE — Telephone Encounter (Signed)
Per office note on 01/23/18 She wanted to proceed with methotrexate.  We will obtain some baseline labs today.  Once the labs are available we will start her on methotrexate at 4 tablets p.o. weekly along with folic acid.  We will monitor her labs in 2 weeks, 2 months and then every 3 months.

## 2018-02-14 DIAGNOSIS — D0461 Carcinoma in situ of skin of right upper limb, including shoulder: Secondary | ICD-10-CM | POA: Diagnosis not present

## 2018-02-27 ENCOUNTER — Telehealth: Payer: Self-pay | Admitting: Rheumatology

## 2018-02-27 DIAGNOSIS — Z79899 Other long term (current) drug therapy: Secondary | ICD-10-CM

## 2018-02-27 NOTE — Telephone Encounter (Signed)
Lab orders released.  

## 2018-02-27 NOTE — Telephone Encounter (Signed)
Patient called requesting her labwork orders be sent to [facility] in [place].  Patient states she will be going on Thursday 11/14.

## 2018-02-27 NOTE — Addendum Note (Signed)
Addended by: Carole Binning on: 02/27/2018 10:09 AM   Modules accepted: Orders

## 2018-03-02 DIAGNOSIS — Z79899 Other long term (current) drug therapy: Secondary | ICD-10-CM | POA: Diagnosis not present

## 2018-03-03 LAB — CMP14+EGFR
A/G RATIO: 2.3 — AB (ref 1.2–2.2)
AST: 17 IU/L (ref 0–40)
Albumin: 4.2 g/dL (ref 3.5–4.8)
Alkaline Phosphatase: 88 IU/L (ref 39–117)
BILIRUBIN TOTAL: 0.3 mg/dL (ref 0.0–1.2)
BUN/Creatinine Ratio: 18 (ref 12–28)
BUN: 12 mg/dL (ref 8–27)
CALCIUM: 9.3 mg/dL (ref 8.7–10.3)
CHLORIDE: 98 mmol/L (ref 96–106)
CO2: 27 mmol/L (ref 20–29)
Creatinine, Ser: 0.66 mg/dL (ref 0.57–1.00)
GFR calc Af Amer: 103 mL/min/{1.73_m2} (ref >59)
GFR calc non Af Amer: 89 mL/min/{1.73_m2} (ref >59)
Globulin, Total: 1.8 g/dL (ref 1.5–4.5)
Glucose: 93 mg/dL (ref 65–99)
POTASSIUM: 4.2 mmol/L (ref 3.5–5.2)
Sodium: 133 mmol/L — ABNORMAL LOW (ref 134–144)
Total Protein: 6 g/dL (ref 6.0–8.5)

## 2018-03-18 HISTORY — PX: SQUAMOUS CELL CARCINOMA EXCISION: SHX2433

## 2018-03-20 DIAGNOSIS — K581 Irritable bowel syndrome with constipation: Secondary | ICD-10-CM | POA: Diagnosis not present

## 2018-03-21 DIAGNOSIS — H47233 Glaucomatous optic atrophy, bilateral: Secondary | ICD-10-CM | POA: Diagnosis not present

## 2018-03-21 DIAGNOSIS — H40013 Open angle with borderline findings, low risk, bilateral: Secondary | ICD-10-CM | POA: Diagnosis not present

## 2018-03-27 DIAGNOSIS — C44622 Squamous cell carcinoma of skin of right upper limb, including shoulder: Secondary | ICD-10-CM | POA: Diagnosis not present

## 2018-04-02 ENCOUNTER — Other Ambulatory Visit: Payer: Self-pay | Admitting: Rheumatology

## 2018-04-02 ENCOUNTER — Telehealth: Payer: Self-pay | Admitting: Rheumatology

## 2018-04-02 DIAGNOSIS — Z79899 Other long term (current) drug therapy: Secondary | ICD-10-CM

## 2018-04-02 NOTE — Telephone Encounter (Signed)
Patient called requesting her labwork orders be sent to [facility] in [place].  Patient states she will be going on Wednesday 04/04/18.

## 2018-04-02 NOTE — Telephone Encounter (Addendum)
Refill requested too soon.

## 2018-04-02 NOTE — Telephone Encounter (Signed)
Lab Orders Released.

## 2018-04-04 DIAGNOSIS — Z79899 Other long term (current) drug therapy: Secondary | ICD-10-CM | POA: Diagnosis not present

## 2018-04-05 LAB — CBC WITH DIFFERENTIAL/PLATELET
BASOS ABS: 0.1 10*3/uL (ref 0.0–0.2)
BASOS: 1 %
EOS (ABSOLUTE): 0.3 10*3/uL (ref 0.0–0.4)
Eos: 6 %
HEMATOCRIT: 36.5 % (ref 34.0–46.6)
HEMOGLOBIN: 12.6 g/dL (ref 11.1–15.9)
LYMPHS: 35 %
Lymphocytes Absolute: 2 10*3/uL (ref 0.7–3.1)
MCH: 28.2 pg (ref 26.6–33.0)
MCHC: 34.5 g/dL (ref 31.5–35.7)
MCV: 82 fL (ref 79–97)
MONOCYTES: 6 %
Monocytes Absolute: 0.4 10*3/uL (ref 0.1–0.9)
NEUTROS PCT: 52 %
Neutrophils Absolute: 3.1 10*3/uL (ref 1.4–7.0)
Platelets: 273 10*3/uL (ref 150–450)
RBC: 4.47 x10E6/uL (ref 3.77–5.28)
RDW: 13.7 % (ref 12.3–15.4)
WBC: 5.8 10*3/uL (ref 3.4–10.8)

## 2018-04-05 LAB — CMP14+EGFR
ALBUMIN: 4.1 g/dL (ref 3.5–4.8)
ALT: 3 IU/L (ref 0–32)
AST: 17 IU/L (ref 0–40)
Albumin/Globulin Ratio: 2.6 — ABNORMAL HIGH (ref 1.2–2.2)
Alkaline Phosphatase: 87 IU/L (ref 39–117)
BUN / CREAT RATIO: 15 (ref 12–28)
BUN: 10 mg/dL (ref 8–27)
Bilirubin Total: 0.4 mg/dL (ref 0.0–1.2)
CALCIUM: 9 mg/dL (ref 8.7–10.3)
CO2: 25 mmol/L (ref 20–29)
CREATININE: 0.65 mg/dL (ref 0.57–1.00)
Chloride: 98 mmol/L (ref 96–106)
GFR, EST AFRICAN AMERICAN: 103 mL/min/{1.73_m2} (ref >59)
GFR, EST NON AFRICAN AMERICAN: 90 mL/min/{1.73_m2} (ref >59)
GLOBULIN, TOTAL: 1.6 g/dL (ref 1.5–4.5)
Glucose: 87 mg/dL (ref 65–99)
Potassium: 4 mmol/L (ref 3.5–5.2)
Sodium: 135 mmol/L (ref 134–144)
Total Protein: 5.7 g/dL — ABNORMAL LOW (ref 6.0–8.5)

## 2018-04-13 NOTE — Progress Notes (Signed)
Office Visit Note  Patient: Dawn Harrell             Date of Birth: 10-08-1945           MRN: [id number]             PCP: Melony Overly, MD Referring: Melony Overly, MD Visit Date: 04/27/2018 Occupation: @GUAROCC @  Subjective:  Right foot pain   History of Present Illness: Dawn Harrell is a 72 y.o. female with history of seropositive rheumatoid arthritis and osteoarthritis.  She is taking MTX 4 tablets by mouth once weekly and folic acid 1 mg po daily.  She discontinued taking PLQ when she started MTX due to misunderstanding that she was supposed to stay on the combination.  She states she continues to have chronic pain in both feet.  She states she notices some swelling in the right foot.  She continues to have intermittent right ankle and right knee pain, but she denies any joint swelling. She denies any other joint pain or joint swelling at this time.  She has not noticed improvement on MTX monotherapy.    Activities of Daily Living:  Patient reports morning stiffness for 15 minutes.   Patient Denies nocturnal pain.  Difficulty dressing/grooming: Denies Difficulty climbing stairs: Reports Difficulty getting out of chair: Denies Difficulty using hands for taps, buttons, cutlery, and/or writing: Denies  Review of Systems  Constitutional: Negative for fatigue.  HENT: Negative for mouth sores, mouth dryness and nose dryness.   Eyes: Negative for pain, visual disturbance and dryness.  Respiratory: Negative for cough, hemoptysis, shortness of breath and difficulty breathing.   Cardiovascular: Negative for chest pain, palpitations, hypertension and swelling in legs/feet.  Gastrointestinal: Negative for blood in stool, constipation and diarrhea.  Endocrine: Negative for increased urination.  Genitourinary: Negative for painful urination.  Musculoskeletal: Positive for arthralgias, joint pain, joint swelling and morning stiffness. Negative for myalgias, muscle weakness, muscle  tenderness and myalgias.  Skin: Negative for color change, pallor, rash, hair loss, nodules/bumps, skin tightness, ulcers and sensitivity to sunlight.  Allergic/Immunologic: Negative for susceptible to infections.  Neurological: Negative for dizziness, numbness, headaches and weakness.  Hematological: Negative for swollen glands.  Psychiatric/Behavioral: Positive for sleep disturbance. Negative for depressed mood. The patient is not nervous/anxious.     PMFS History:  Patient Active Problem List   Diagnosis Date Noted  . Chondromalacia of both patellae 10/04/2016  . History of tremor/ Parkinsons dx.  10/04/2016  . History of TIA (transient ischemic attack) 10/04/2016  . History of hyperlipidemia 10/04/2016  . Tremor 03/24/2016  . Rheumatoid arthritis involving multiple sites with positive rheumatoid factor ([place]) 03/24/2016  . Primary osteoarthritis of both hands 03/24/2016  . Primary osteoarthritis of both knees 03/24/2016  . Primary osteoarthritis of both feet 03/24/2016  . High risk medication use 03/24/2016    Past Medical History:  Diagnosis Date  . Arthritis   . Parkinson's disease ([place])   . skin cancer     Family History  Problem Relation Age of Onset  . Stroke Mother   . Heart attack Father   . Rheum arthritis Sister   . Lung disease Brother   . Diabetes Maternal Grandmother   . Diabetes Paternal Grandmother   . Leukemia Sister    Past Surgical History:  Procedure Laterality Date  . ABDOMINAL HYSTERECTOMY    . bladder tack    . SKIN CANCER EXCISION  11/2015  . SQUAMOUS CELL CARCINOMA EXCISION  03/2018   right arm  Social History   Social History Narrative  . Not on file    There is no immunization history on file for this patient.  Objective: Vital Signs: BP 127/79 (BP Location: Left Arm, Patient Position: Sitting, Cuff Size: Normal)   Pulse 68   Resp 14   Ht 5' 1"  (1.549 m)   Wt 147 lb 9.6 oz (67 kg)   BMI 27.89 kg/m    Physical Exam Vitals  signs and nursing note reviewed.  Constitutional:      Appearance: She is well-developed.  HENT:     Head: Normocephalic and atraumatic.  Eyes:     Conjunctiva/sclera: Conjunctivae normal.  Neck:     Musculoskeletal: Normal range of motion.  Cardiovascular:     Rate and Rhythm: Normal rate and regular rhythm.     Heart sounds: Normal heart sounds.  Pulmonary:     Effort: Pulmonary effort is normal.     Breath sounds: Normal breath sounds.  Abdominal:     General: Bowel sounds are normal.     Palpations: Abdomen is soft.  Lymphadenopathy:     Cervical: No cervical adenopathy.  Skin:    General: Skin is warm and dry.     Capillary Refill: Capillary refill takes less than 2 seconds.  Neurological:     Mental Status: She is alert and oriented to person, place, and time.  Psychiatric:        Behavior: Behavior normal.      Musculoskeletal Exam: C-spine good ROM.  Thoracic kyphosis noted.  Limited ROM of lumbar spine with some discomfort.  Shoulder joints, elbow joints, wrist joints, MCPs, PIPs, and DIPs good ROM with no synovitis.  Synovial thickening of 1st and 2nd MCP joints. PIP and DIP synovial thickening consistent with osteoarthritis.  Bilateral CMC joint synovial thickening.  Hip joints, knee joints, ankle joints, MTPs, PIPs, and DIPs good ROM with no synovitis.  Right knee warmth but no effusion noted.  No tenderness or swelling of ankle joints.  She has tenderness of the right 1st and 2nd MTPs.  No tenderness over trochanteric bursa bilaterally.   CDAI Exam: CDAI Score: 1.5  Patient Global Assessment: 2 (mm); Provider Global Assessment: 3 (mm) Swollen: 0 ; Tender: 3  Joint Exam      Right  Left  Knee   Tender     MTP 1   Tender     MTP 2   Tender        Investigation: No additional findings.  Imaging: No results found.  Recent Labs: Lab Results  Component Value Date   WBC 5.8 04/04/2018   HGB 12.6 04/04/2018   PLT 273 04/04/2018   NA 135 04/04/2018   K  4.0 04/04/2018   CL 98 04/04/2018   CO2 25 04/04/2018   GLUCOSE 87 04/04/2018   BUN 10 04/04/2018   CREATININE 0.65 04/04/2018   BILITOT 0.4 04/04/2018   ALKPHOS 87 04/04/2018   AST 17 04/04/2018   ALT 3 04/04/2018   PROT 5.7 (L) 04/04/2018   ALBUMIN 4.1 04/04/2018   CALCIUM 9.0 04/04/2018   GFRAA 103 04/04/2018   QFTBGOLDPLUS NEGATIVE 01/23/2018    Speciality Comments: PLQ Eye Exam: 12/19/17 WNL @ [facility] up in 1 year Immunosupressant Lab Work: TB gold-negative 09/04/13 Hepatitis panel- negative except Hep A IgM antibody 09/04/13  Procedures:  No procedures performed Allergies: Gabapentin   Assessment / Plan:     Visit Diagnoses: Rheumatoid arthritis involving multiple sites with positive  rheumatoid factor ([place]): She has right knee warmth on exam.  She continues to have chronic right knee joint, right ankle joints, and right foot pain.  She has tenderness of the right 1st and 2nd MTPs on exam. She was started on MTX 4 tablets by mouth once weekly and folic acid 1 mg po daily after her last visit in October 2019.  She discontinued PLQ at that time due to misunderstanding that she would be on combination therapy.  She will restart on PLQ 200 mg BID M-F.  A refill of MTX was sent to the pharmacy today.  She was advised to notify us if she develops increased joint pain or joint swelling.  She will follow up in 5 months.   High risk medication use - MTX 4 tablets by mouth once weekly, folic acid 1 mg po daily, and PLQ 200 mg BID M-F. eye exam 12/19/17.  Standing orders for CBC and CMP were placed today.  She will be due for lab work in March and every 3 months to monitor for drug toxicity.- Plan: CBC with Differential/Platelet, CMP14+EGFR  Primary osteoarthritis of both hands: She has PIP and DIP synovial thickening consistent with osteoarthritis.  She has bilateral CMC joint synovial thickening.  She has no synovitis.  She has complete fist formation.  Joint protection and muscle  strengthening were discussed.   Primary osteoarthritis of both knees: She has warmth of the right knee joint on exam.  She has good ROM on exam.  No warmth or effusion of the left knee joint on exam. She has bilateral knee crepitus.    Chondromalacia of both patellae: She has bilateral knee crepitus.    Primary osteoarthritis of both feet: She has PIP and DIP synovial thickening consistent with osteoarthritis of both feet. She has chronic pain in both feet.  She has tenderness of the right 1st and 2nd MTPs on exam.  She wears proper fitting shoes.   Other medical conditions are listed as follows:   History of hyperlipidemia  History of tremor/ Parkinsons dx.   History of TIA (transient ischemic attack)   Orders: Orders Placed This Encounter  Procedures  . CBC with Differential/Platelet  . CMP14+EGFR   Meds ordered this encounter  Medications  . methotrexate (RHEUMATREX) 2.5 MG tablet    Sig: Take 4 tablets (10 mg total) by mouth once a week. Caution:Chemotherapy. Protect from light.    Dispense:  48 tablet    Refill:  0     Follow-Up Instructions: Return in about 5 months (around 09/26/2018) for Rheumatoid arthritis, Osteoarthritis.   Ofilia Neas, PA-C  Note - This record has been created using Dragon software.  Chart creation errors have been sought, but may not always  have been located. Such creation errors do not reflect on  the standard of medical care.

## 2018-04-27 ENCOUNTER — Encounter: Payer: Self-pay | Admitting: Physician Assistant

## 2018-04-27 ENCOUNTER — Ambulatory Visit: Payer: Medicare HMO | Admitting: Physician Assistant

## 2018-04-27 VITALS — BP 127/79 | HR 68 | Resp 14 | Ht 61.0 in | Wt 147.6 lb

## 2018-04-27 DIAGNOSIS — Z8673 Personal history of transient ischemic attack (TIA), and cerebral infarction without residual deficits: Secondary | ICD-10-CM

## 2018-04-27 DIAGNOSIS — M19041 Primary osteoarthritis, right hand: Secondary | ICD-10-CM | POA: Diagnosis not present

## 2018-04-27 DIAGNOSIS — M170 Bilateral primary osteoarthritis of knee: Secondary | ICD-10-CM | POA: Diagnosis not present

## 2018-04-27 DIAGNOSIS — M2241 Chondromalacia patellae, right knee: Secondary | ICD-10-CM | POA: Diagnosis not present

## 2018-04-27 DIAGNOSIS — M19072 Primary osteoarthritis, left ankle and foot: Secondary | ICD-10-CM

## 2018-04-27 DIAGNOSIS — M2242 Chondromalacia patellae, left knee: Secondary | ICD-10-CM

## 2018-04-27 DIAGNOSIS — M19071 Primary osteoarthritis, right ankle and foot: Secondary | ICD-10-CM | POA: Diagnosis not present

## 2018-04-27 DIAGNOSIS — M0579 Rheumatoid arthritis with rheumatoid factor of multiple sites without organ or systems involvement: Secondary | ICD-10-CM | POA: Diagnosis not present

## 2018-04-27 DIAGNOSIS — M19042 Primary osteoarthritis, left hand: Secondary | ICD-10-CM

## 2018-04-27 DIAGNOSIS — Z8639 Personal history of other endocrine, nutritional and metabolic disease: Secondary | ICD-10-CM

## 2018-04-27 DIAGNOSIS — Z79899 Other long term (current) drug therapy: Secondary | ICD-10-CM | POA: Diagnosis not present

## 2018-04-27 DIAGNOSIS — Z8669 Personal history of other diseases of the nervous system and sense organs: Secondary | ICD-10-CM

## 2018-04-27 MED ORDER — METHOTREXATE 2.5 MG PO TABS: 10.0000 mg | ORAL_TABLET | ORAL | 0 refills | Status: DC

## 2018-04-27 NOTE — Patient Instructions (Signed)
Standing Labs We placed an order today for your standing lab work.    Please come back and get your standing labs in March and every 3 months  We have open lab Monday through Friday from 8:30-11:30 AM and 1:30-4:00 PM  at the office of Dr. Shaili Deveshwar.   You may experience shorter wait times on Monday and Friday afternoons. The office is located at [address], [address], [place], [place] [address] No appointment is necessary.   Labs are drawn by [facility].  You may receive a bill from [facility] for your lab work.  If you wish to have your labs drawn at another location, please call the office 24 hours in advance to send orders.  If you have any questions regarding directions or hours of operation,  please call [phone].   Just as a reminder please drink plenty of water prior to coming for your lab work. Thanks!  

## 2018-05-09 DIAGNOSIS — L821 Other seborrheic keratosis: Secondary | ICD-10-CM | POA: Diagnosis not present

## 2018-05-09 DIAGNOSIS — L570 Actinic keratosis: Secondary | ICD-10-CM | POA: Diagnosis not present

## 2018-05-09 DIAGNOSIS — L814 Other melanin hyperpigmentation: Secondary | ICD-10-CM | POA: Diagnosis not present

## 2018-05-09 DIAGNOSIS — Z85828 Personal history of other malignant neoplasm of skin: Secondary | ICD-10-CM | POA: Diagnosis not present

## 2018-05-09 DIAGNOSIS — L853 Xerosis cutis: Secondary | ICD-10-CM | POA: Diagnosis not present

## 2018-05-09 DIAGNOSIS — L579 Skin changes due to chronic exposure to nonionizing radiation, unspecified: Secondary | ICD-10-CM | POA: Diagnosis not present

## 2018-05-09 DIAGNOSIS — D1801 Hemangioma of skin and subcutaneous tissue: Secondary | ICD-10-CM | POA: Diagnosis not present

## 2018-06-06 ENCOUNTER — Other Ambulatory Visit: Payer: Self-pay | Admitting: Rheumatology

## 2018-06-06 NOTE — Telephone Encounter (Signed)
Last Visit: 04/27/2018 Next Visit: 09/21/2018 Labs: 04/04/2018 CBC WNL. CMP stable Eye exam: 12/19/2017   Okay to refill per Dr. Estanislado Pandy.

## 2018-06-22 DIAGNOSIS — K589 Irritable bowel syndrome without diarrhea: Secondary | ICD-10-CM | POA: Diagnosis not present

## 2018-06-22 DIAGNOSIS — E785 Hyperlipidemia, unspecified: Secondary | ICD-10-CM | POA: Diagnosis not present

## 2018-06-22 DIAGNOSIS — G20 Parkinson's disease: Secondary | ICD-10-CM | POA: Diagnosis not present

## 2018-06-22 DIAGNOSIS — M05761 Rheumatoid arthritis with rheumatoid factor of right knee without organ or systems involvement: Secondary | ICD-10-CM | POA: Diagnosis not present

## 2018-07-18 ENCOUNTER — Telehealth: Payer: Self-pay | Admitting: Rheumatology

## 2018-07-18 ENCOUNTER — Other Ambulatory Visit: Payer: Self-pay

## 2018-07-18 ENCOUNTER — Telehealth: Payer: Self-pay

## 2018-07-18 DIAGNOSIS — Z79899 Other long term (current) drug therapy: Secondary | ICD-10-CM

## 2018-07-18 MED ORDER — METHOTREXATE 2.5 MG PO TABS: 10.0000 mg | ORAL_TABLET | ORAL | 0 refills | Status: DC

## 2018-07-18 NOTE — Telephone Encounter (Signed)
Patient called requesting labwork orders be sent to [facility] in [place].  Patient states she plans to go on Friday morning 07/20/18.

## 2018-07-18 NOTE — Telephone Encounter (Signed)
Received refill request via fax from [facility] for MTX.   Last Visit: 04/27/2018 Next Visit: 09/21/2018 Labs: 04/04/2018 CBC WNL. CMP stable  Attempted to contact patient and left message on machine to advise patient she is due for labs. Advised patient we are not currently drawing labs in the office, she will have to go to a main [facility] or [facility] to refill per Dr. Estanislado Pandy.

## 2018-07-18 NOTE — Telephone Encounter (Signed)
Lab orders have been released for [facility].

## 2018-07-20 DIAGNOSIS — Z79899 Other long term (current) drug therapy: Secondary | ICD-10-CM | POA: Diagnosis not present

## 2018-07-21 LAB — CMP14+EGFR
ALT: 4 IU/L (ref 0–32)
AST: 18 IU/L (ref 0–40)
Albumin/Globulin Ratio: 2.7 — ABNORMAL HIGH (ref 1.2–2.2)
Albumin: 4.3 g/dL (ref 3.7–4.7)
Alkaline Phosphatase: 92 IU/L (ref 39–117)
BUN/Creatinine Ratio: 19 (ref 12–28)
BUN: 13 mg/dL (ref 8–27)
Bilirubin Total: 0.3 mg/dL (ref 0.0–1.2)
CO2: 27 mmol/L (ref 20–29)
Calcium: 9.8 mg/dL (ref 8.7–10.3)
Chloride: 98 mmol/L (ref 96–106)
Creatinine, Ser: 0.68 mg/dL (ref 0.57–1.00)
GFR calc Af Amer: 102 mL/min/{1.73_m2} (ref >59)
GFR calc non Af Amer: 88 mL/min/{1.73_m2} (ref >59)
Globulin, Total: 1.6 g/dL (ref 1.5–4.5)
Glucose: 86 mg/dL (ref 65–99)
Potassium: 4.4 mmol/L (ref 3.5–5.2)
Sodium: 135 mmol/L (ref 134–144)
Total Protein: 5.9 g/dL — ABNORMAL LOW (ref 6.0–8.5)

## 2018-07-21 LAB — CBC WITH DIFFERENTIAL/PLATELET
Basophils Absolute: 0.1 10*3/uL (ref 0.0–0.2)
Basos: 1 %
EOS (ABSOLUTE): 0.2 10*3/uL (ref 0.0–0.4)
Eos: 3 %
Hematocrit: 39 % (ref 34.0–46.6)
Hemoglobin: 13.6 g/dL (ref 11.1–15.9)
Lymphocytes Absolute: 2 10*3/uL (ref 0.7–3.1)
Lymphs: 37 %
MCH: 29 pg (ref 26.6–33.0)
MCHC: 34.9 g/dL (ref 31.5–35.7)
MCV: 83 fL (ref 79–97)
Monocytes Absolute: 0.5 10*3/uL (ref 0.1–0.9)
Monocytes: 9 %
Neutrophils Absolute: 2.7 10*3/uL (ref 1.4–7.0)
Neutrophils: 50 %
Platelets: 275 10*3/uL (ref 150–450)
RBC: 4.69 x10E6/uL (ref 3.77–5.28)
RDW: 14.6 % (ref 11.7–15.4)
WBC: 5.4 10*3/uL (ref 3.4–10.8)

## 2018-07-23 NOTE — Progress Notes (Signed)
CMP is stable. CBC WNL.

## 2018-08-02 ENCOUNTER — Other Ambulatory Visit: Payer: Self-pay | Admitting: Rheumatology

## 2018-08-02 DIAGNOSIS — M0579 Rheumatoid arthritis with rheumatoid factor of multiple sites without organ or systems involvement: Secondary | ICD-10-CM

## 2018-08-02 NOTE — Telephone Encounter (Signed)
Last Visit: 04/27/2018 Next Visit: 09/19/2018 Labs: 07/20/18 CMP is stable. CBC WNL.  PLQ Eye Exam: 12/19/17 WNL  Okay to refill per Dr. Estanislado Pandy

## 2018-09-05 NOTE — Progress Notes (Signed)
Office Visit Note  Patient: Dawn Harrell             Date of Birth: 1945-11-12           MRN: [id number]             PCP: Nicoletta Dress, MD Referring: Melony Overly, MD Visit Date: 09/19/2018 Occupation: @GUAROCC @  Subjective:  Right knee pain.   History of Present Illness: Dawn Harrell is a 73 y.o. female with history of rheumatoid arthritis and osteoarthritis overlap.  She states she has been having increased pain and discomfort in her right knee joint.  She requests having a cortisone injection to her right knee joint.  She states none of the other joints are as painful.  She denies any joint swelling.  She has been taking methotrexate and Plaquenil on a regular basis.  Activities of Daily Living:  Patient reports morning stiffness for 15 minutes.   Patient Reports nocturnal pain.  Difficulty dressing/grooming: Denies Difficulty climbing stairs: Reports Difficulty getting out of chair: Reports Difficulty using hands for taps, buttons, cutlery, and/or writing: Reports  Review of Systems  Constitutional: Negative for fatigue, night sweats, weight gain and weight loss.  HENT: Negative for mouth sores, trouble swallowing, trouble swallowing, mouth dryness and nose dryness.   Eyes: Negative for pain, redness, visual disturbance and dryness.  Respiratory: Negative for cough, shortness of breath and difficulty breathing.   Cardiovascular: Negative for chest pain, palpitations, hypertension, irregular heartbeat and swelling in legs/feet.  Gastrointestinal: Negative for blood in stool, constipation and diarrhea.  Endocrine: Negative for increased urination.  Genitourinary: Negative for vaginal dryness.  Musculoskeletal: Positive for arthralgias, joint pain and morning stiffness. Negative for joint swelling, myalgias, muscle weakness, muscle tenderness and myalgias.  Skin: Negative for color change, rash, hair loss, skin tightness, ulcers and sensitivity to sunlight.   Allergic/Immunologic: Negative for susceptible to infections.  Neurological: Positive for tremors. Negative for dizziness, memory loss, night sweats and weakness.  Hematological: Negative for swollen glands.  Psychiatric/Behavioral: Positive for sleep disturbance. Negative for depressed mood. The patient is not nervous/anxious.     PMFS History:  Patient Active Problem List   Diagnosis Date Noted  . Chondromalacia of both patellae 10/04/2016  . History of tremor/ Parkinsons dx.  10/04/2016  . History of TIA (transient ischemic attack) 10/04/2016  . History of hyperlipidemia 10/04/2016  . Tremor 03/24/2016  . Rheumatoid arthritis involving multiple sites with positive rheumatoid factor ([place]) 03/24/2016  . Primary osteoarthritis of both hands 03/24/2016  . Primary osteoarthritis of both knees 03/24/2016  . Primary osteoarthritis of both feet 03/24/2016  . High risk medication use 03/24/2016    Past Medical History:  Diagnosis Date  . Arthritis   . Parkinson's disease ([place])   . skin cancer     Family History  Problem Relation Age of Onset  . Stroke Mother   . Heart attack Father   . Rheum arthritis Sister   . Lung disease Brother   . Diabetes Maternal Grandmother   . Diabetes Paternal Grandmother   . Leukemia Sister    Past Surgical History:  Procedure Laterality Date  . ABDOMINAL HYSTERECTOMY    . bladder tack    . SKIN CANCER EXCISION  11/2015  . SQUAMOUS CELL CARCINOMA EXCISION  03/2018   right arm   Social History   Social History Narrative  . Not on file    There is no immunization history on file for this patient.   Objective:  Vital Signs: BP 127/85 (BP Location: Right Arm, Patient Position: Sitting, Cuff Size: Normal)   Pulse 75   Resp 14   Ht 5' (1.524 m)   Wt 148 lb 6.4 oz (67.3 kg)   BMI 28.98 kg/m    Physical Exam Vitals signs and nursing note reviewed.  Constitutional:      Appearance: She is well-developed.  HENT:     Head: Normocephalic  and atraumatic.  Eyes:     Conjunctiva/sclera: Conjunctivae normal.  Neck:     Musculoskeletal: Normal range of motion.  Cardiovascular:     Rate and Rhythm: Normal rate and regular rhythm.     Heart sounds: Normal heart sounds.  Pulmonary:     Effort: Pulmonary effort is normal.     Breath sounds: Normal breath sounds.  Abdominal:     General: Bowel sounds are normal.     Palpations: Abdomen is soft.  Lymphadenopathy:     Cervical: No cervical adenopathy.  Skin:    General: Skin is warm and dry.     Capillary Refill: Capillary refill takes less than 2 seconds.  Neurological:     Mental Status: She is alert and oriented to person, place, and time.  Psychiatric:        Behavior: Behavior normal.      Musculoskeletal Exam: C-spine good range of motion.  Shoulder joints elbow joints wrist joints with good range of motion.  She had bilateral MCP thickening PIP and DIP thickening but no synovitis.  She had some discomfort range of motion of her right knee joint without any warmth.  She had overcrowding of toes and her feet with PIP DIP and MCP thickening consistent with osteoarthritis and rheumatoid arthritis overlap.  CDAI Exam: CDAI Score: 0  Patient Global Assessment: 0 (mm); Provider Global Assessment: 0 (mm) Swollen: 0 ; Tender: 0  Joint Exam   Not documented   There is currently no information documented on the homunculus. Go to the Rheumatology activity and complete the homunculus joint exam.  Investigation: No additional findings.  Imaging: No results found.  Recent Labs: Lab Results  Component Value Date   WBC 5.4 07/20/2018   HGB 13.6 07/20/2018   PLT 275 07/20/2018   NA 135 07/20/2018   K 4.4 07/20/2018   CL 98 07/20/2018   CO2 27 07/20/2018   GLUCOSE 86 07/20/2018   BUN 13 07/20/2018   CREATININE 0.68 07/20/2018   BILITOT 0.3 07/20/2018   ALKPHOS 92 07/20/2018   AST 18 07/20/2018   ALT 4 07/20/2018   PROT 5.9 (L) 07/20/2018   ALBUMIN 4.3 07/20/2018    CALCIUM 9.8 07/20/2018   GFRAA 102 07/20/2018   QFTBGOLDPLUS NEGATIVE 01/23/2018    Speciality Comments: PLQ Eye Exam: 12/19/17 WNL @ [facility] up in 1 year Immunosupressant Lab Work: TB gold-negative 09/04/13 Hepatitis panel- negative except Hep A IgM antibody 09/04/13  Procedures:  Large Joint Inj: R knee on 09/19/2018 2:00 PM Indications: pain Details: 27 G 1.5 in needle, medial approach  Arthrogram: No  Medications: 40 mg triamcinolone acetonide 40 MG/ML; 1.5 mL lidocaine 1 % Aspirate: 0 mL Outcome: tolerated well, no immediate complications Procedure, treatment alternatives, risks and benefits explained, specific risks discussed. Consent was given by the patient. Immediately prior to procedure a time out was called to verify the correct patient, procedure, equipment, support staff and site/side marked as required. Patient was prepped and draped in the usual sterile fashion.     Allergies: Gabapentin  Assessment / Plan:     Visit Diagnoses: Rheumatoid arthritis involving multiple sites with positive rheumatoid factor ([facility])-patient had no synovitis on examination.  Her arthritis seems to be very well controlled with methotrexate and Plaquenil combination.  High risk medication use - She is on methotrexate 4 tablets every 7 days, Plaquenil 200 mg twice daily Monday through Friday only, and folic acid 1 mg daily.  Last Plaquenil eye exam normal on 12/19/2017.  Most recent CBC within normal limits and CMP stable on 07/20/2018.  Will monitor every 3 months and standing orders are in place.Recommend annual influenza, Pneumovax 23, Prevnar 13, and Shingrix as indicated.  Acute pain of right knee -she has been having pain and discomfort in the right knee joint.  No warmth swelling or effusion was noted.  Plan: XR KNEE 3 VIEW RIGHT.  The x-ray was consistent with mild osteoarthritis and severe chondromalacia patella.  The patient's request right knee joint was prepped in sterile  fashion and injected with cortisone as described above.  She tolerated the procedure well.  Primary osteoarthritis of both knees-she has discomfort in her bilateral knee joints but no warmth swelling or effusion was noted.  Chondromalacia of both patellae-joint protection muscle strengthening was discussed.  Primary osteoarthritis of both hands-she has osteoarthritis and rheumatoid arthritis overlap which causes discomfort.  Primary osteoarthritis of both feet-she has severe rheumatoid arthritis and osteoarthritis in her feet.  History of TIA (transient ischemic attack)  History of tremor/ Parkinsons dx.   History of hyperlipidemia   Orders: Orders Placed This Encounter  Procedures  . Large Joint Inj  . XR KNEE 3 VIEW RIGHT   No orders of the defined types were placed in this encounter.   Face-to-face time spent with patient was 30 minutes. Greater than 50% of time was spent in counseling and coordination of care.  Follow-Up Instructions: Return in about 5 months (around 02/19/2019) for Rheumatoid arthritis, Osteoarthritis.   Bo Merino, MD  Note - This record has been created using Editor, commissioning.  Chart creation errors have been sought, but may not always  have been located. Such creation errors do not reflect on  the standard of medical care.

## 2018-09-10 ENCOUNTER — Other Ambulatory Visit: Payer: Self-pay | Admitting: Rheumatology

## 2018-09-19 ENCOUNTER — Other Ambulatory Visit: Payer: Self-pay

## 2018-09-19 ENCOUNTER — Encounter: Payer: Self-pay | Admitting: Rheumatology

## 2018-09-19 ENCOUNTER — Ambulatory Visit: Payer: Medicare HMO | Admitting: Rheumatology

## 2018-09-19 ENCOUNTER — Ambulatory Visit (INDEPENDENT_AMBULATORY_CARE_PROVIDER_SITE_OTHER): Payer: Medicare HMO

## 2018-09-19 VITALS — BP 127/85 | HR 75 | Resp 14 | Ht 60.0 in | Wt 148.4 lb

## 2018-09-19 DIAGNOSIS — M2241 Chondromalacia patellae, right knee: Secondary | ICD-10-CM

## 2018-09-19 DIAGNOSIS — Z8669 Personal history of other diseases of the nervous system and sense organs: Secondary | ICD-10-CM | POA: Diagnosis not present

## 2018-09-19 DIAGNOSIS — M19042 Primary osteoarthritis, left hand: Secondary | ICD-10-CM

## 2018-09-19 DIAGNOSIS — M19041 Primary osteoarthritis, right hand: Secondary | ICD-10-CM

## 2018-09-19 DIAGNOSIS — M25561 Pain in right knee: Secondary | ICD-10-CM

## 2018-09-19 DIAGNOSIS — M170 Bilateral primary osteoarthritis of knee: Secondary | ICD-10-CM

## 2018-09-19 DIAGNOSIS — Z79899 Other long term (current) drug therapy: Secondary | ICD-10-CM

## 2018-09-19 DIAGNOSIS — M19071 Primary osteoarthritis, right ankle and foot: Secondary | ICD-10-CM | POA: Diagnosis not present

## 2018-09-19 DIAGNOSIS — M0579 Rheumatoid arthritis with rheumatoid factor of multiple sites without organ or systems involvement: Secondary | ICD-10-CM

## 2018-09-19 DIAGNOSIS — M19072 Primary osteoarthritis, left ankle and foot: Secondary | ICD-10-CM

## 2018-09-19 DIAGNOSIS — Z8639 Personal history of other endocrine, nutritional and metabolic disease: Secondary | ICD-10-CM

## 2018-09-19 DIAGNOSIS — Z8673 Personal history of transient ischemic attack (TIA), and cerebral infarction without residual deficits: Secondary | ICD-10-CM | POA: Diagnosis not present

## 2018-09-19 DIAGNOSIS — M2242 Chondromalacia patellae, left knee: Secondary | ICD-10-CM

## 2018-09-19 MED ORDER — TRIAMCINOLONE ACETONIDE 40 MG/ML IJ SUSP
40.0000 mg | INTRAMUSCULAR | Status: AC | PRN
  Administered 2018-09-19: 40 mg via INTRA_ARTICULAR

## 2018-09-19 MED ORDER — LIDOCAINE HCL 1 % IJ SOLN
1.5000 mL | INTRAMUSCULAR | Status: AC | PRN
  Administered 2018-09-19: 1.5 mL

## 2018-09-19 NOTE — Patient Instructions (Signed)
Standing Labs We placed an order today for your standing lab work.    Please come back and get your standing labs in July and every 3 months   We have open lab daily Monday through Thursday from 8:30-12:30 PM and 1:30-4:30 PM and Friday from 8:30-12:30 PM and 1:30 -4:00 PM at the office of Dr. Bo Merino.   You may experience shorter wait times on Monday and Friday afternoons. The office is located at [address] [place], [place], [place] [address] No appointment is necessary.   Labs are drawn by [facility].  You may receive a bill from [facility] for your lab work.  If you wish to have your labs drawn at another location, please call the office 24 hours in advance to send orders.  If you have any questions regarding directions or hours of operation,  please call [phone].   Just as a reminder please drink plenty of water prior to coming for your lab work. Thanks!

## 2018-09-21 ENCOUNTER — Ambulatory Visit: Payer: Medicare HMO | Admitting: Rheumatology

## 2018-09-21 ENCOUNTER — Telehealth: Payer: Self-pay | Admitting: Rheumatology

## 2018-09-21 NOTE — Telephone Encounter (Signed)
Patient left a message stating she got a letter from [facility] stating it is time to renew her MTX, and she does not have any refills left. Patient request you send in a refill if she is suppose to continue to take medication. She is not sure if she is suppose to. Please call to advise patient.

## 2018-09-21 NOTE — Telephone Encounter (Signed)
Spoke with patient and advised her that her MTX is due to be refilled at the beginning of July and she would also be due for labs. Patient verbalized understanding.

## 2018-09-24 DIAGNOSIS — H47233 Glaucomatous optic atrophy, bilateral: Secondary | ICD-10-CM | POA: Diagnosis not present

## 2018-09-24 DIAGNOSIS — H40013 Open angle with borderline findings, low risk, bilateral: Secondary | ICD-10-CM | POA: Diagnosis not present

## 2018-10-08 ENCOUNTER — Other Ambulatory Visit: Payer: Self-pay | Admitting: Rheumatology

## 2018-10-09 NOTE — Telephone Encounter (Signed)
Last Visit: 09/19/18 Next Visit: 02/20/19 Labs: 07/20/18 CMP is stable. CBC WNL.   Okay to refill per Dr. Estanislado Pandy

## 2018-10-10 ENCOUNTER — Other Ambulatory Visit: Payer: Self-pay | Admitting: Rheumatology

## 2018-10-10 DIAGNOSIS — M0579 Rheumatoid arthritis with rheumatoid factor of multiple sites without organ or systems involvement: Secondary | ICD-10-CM

## 2018-10-11 DIAGNOSIS — G20 Parkinson's disease: Secondary | ICD-10-CM | POA: Diagnosis not present

## 2018-10-11 NOTE — Telephone Encounter (Signed)
Last Visit: 09/19/18 Next Visit: 02/20/19 Labs: 07/20/18 CMP is stable. CBC WNL.  PLQ Eye Exam: 12/19/17 WNL  Okay to refill per Dr. Estanislado Pandy

## 2018-11-20 ENCOUNTER — Ambulatory Visit: Payer: Medicare HMO | Admitting: Physician Assistant

## 2018-11-20 ENCOUNTER — Other Ambulatory Visit: Payer: Self-pay

## 2018-11-20 ENCOUNTER — Encounter: Payer: Self-pay | Admitting: Physician Assistant

## 2018-11-20 VITALS — BP 131/84 | HR 72 | Resp 18 | Ht 61.0 in | Wt 144.4 lb

## 2018-11-20 DIAGNOSIS — Z8639 Personal history of other endocrine, nutritional and metabolic disease: Secondary | ICD-10-CM | POA: Diagnosis not present

## 2018-11-20 DIAGNOSIS — M2241 Chondromalacia patellae, right knee: Secondary | ICD-10-CM | POA: Diagnosis not present

## 2018-11-20 DIAGNOSIS — M19042 Primary osteoarthritis, left hand: Secondary | ICD-10-CM

## 2018-11-20 DIAGNOSIS — M19071 Primary osteoarthritis, right ankle and foot: Secondary | ICD-10-CM

## 2018-11-20 DIAGNOSIS — M2242 Chondromalacia patellae, left knee: Secondary | ICD-10-CM

## 2018-11-20 DIAGNOSIS — M19072 Primary osteoarthritis, left ankle and foot: Secondary | ICD-10-CM

## 2018-11-20 DIAGNOSIS — Z79899 Other long term (current) drug therapy: Secondary | ICD-10-CM

## 2018-11-20 DIAGNOSIS — Z8669 Personal history of other diseases of the nervous system and sense organs: Secondary | ICD-10-CM

## 2018-11-20 DIAGNOSIS — Z8673 Personal history of transient ischemic attack (TIA), and cerebral infarction without residual deficits: Secondary | ICD-10-CM | POA: Diagnosis not present

## 2018-11-20 DIAGNOSIS — M19041 Primary osteoarthritis, right hand: Secondary | ICD-10-CM

## 2018-11-20 DIAGNOSIS — M170 Bilateral primary osteoarthritis of knee: Secondary | ICD-10-CM | POA: Diagnosis not present

## 2018-11-20 DIAGNOSIS — M0579 Rheumatoid arthritis with rheumatoid factor of multiple sites without organ or systems involvement: Secondary | ICD-10-CM | POA: Diagnosis not present

## 2018-11-20 MED ORDER — METHOTREXATE 2.5 MG PO TABS: ORAL_TABLET | ORAL | 0 refills | Status: DC

## 2018-11-20 NOTE — Patient Instructions (Addendum)
Standing Labs We placed an order today for your standing lab work.    Please come back and get your standing labs in 2 weeks (after starting on MTX 6 tablets by mouth once weekly) then every 3 months   We have open lab daily Monday through Thursday from 8:30-12:30 PM and 1:30-4:30 PM and Friday from 8:30-12:30 PM and 1:30 -4:00 PM at the office of Dr. Bo Merino.   You may experience shorter wait times on Monday and Friday afternoons. The office is located at [address], [place], [place], [place] [address] No appointment is necessary.   Labs are drawn by [facility].  You may receive a bill from [facility] for your lab work.  If you wish to have your labs drawn at another location, please call the office 24 hours in advance to send orders.  If you have any questions regarding directions or hours of operation,  please call [phone].   Just as a reminder please drink plenty of water prior to coming for your lab work. Thanks!

## 2018-11-20 NOTE — Progress Notes (Signed)
Office Visit Note  Patient: Dawn Harrell             Date of Birth: [birth year]           MRN: [id number]             PCP: Nicoletta Dress, MD Referring: Nicoletta Dress, MD Visit Date: 11/20/2018 Occupation: @GUAROCC @  Subjective:  Right knee joint pain   History of Present Illness: Dawn Harrell is a 73 y.o. female  With history of seropositive rheumatoid arthritis.  She is on MTX 4 tablets po once weekly and PLQ 200 mg BID M-F. She has not missed any doses recently.  She presents today with right knee joint pain and swelling.  She is also having right 1st MTP joint pain and redness.  She has been having difficulty with ambulation.  She walks with a cane.  She experiences stiffness in the right knee joint if she sits for a prolonged period of time.  She had a right knee joint injection on 09/19/18, which did not help her symptoms.   Activities of Daily Living:  Patient reports morning stiffness for 20 minutes.   Patient Denies nocturnal pain.  Difficulty dressing/grooming: Denies Difficulty climbing stairs: Denies Difficulty getting out of chair: Denies Difficulty using hands for taps, buttons, cutlery, and/or writing: Denies  Review of Systems  Constitutional: Positive for fatigue.  HENT: Negative for mouth dryness.   Eyes: Negative for dryness.  Respiratory: Negative for shortness of breath.   Cardiovascular: Positive for swelling in legs/feet.  Gastrointestinal: Negative for constipation.  Endocrine: Negative for excessive thirst.  Genitourinary: Negative for difficulty urinating.  Musculoskeletal: Positive for arthralgias, gait problem, joint pain, joint swelling and morning stiffness.  Skin: Negative for rash.  Allergic/Immunologic: Negative for susceptible to infections.  Neurological: Negative for numbness.  Hematological: Positive for bruising/bleeding tendency.  Psychiatric/Behavioral: Positive for sleep disturbance.    PMFS History:  Patient Active Problem  List   Diagnosis Date Noted  . Chondromalacia of both patellae 10/04/2016  . History of tremor/ Parkinsons dx.  10/04/2016  . History of TIA (transient ischemic attack) 10/04/2016  . History of hyperlipidemia 10/04/2016  . Tremor 03/24/2016  . Rheumatoid arthritis involving multiple sites with positive rheumatoid factor ([place]) 03/24/2016  . Primary osteoarthritis of both hands 03/24/2016  . Primary osteoarthritis of both knees 03/24/2016  . Primary osteoarthritis of both feet 03/24/2016  . High risk medication use 03/24/2016    Past Medical History:  Diagnosis Date  . Arthritis   . Parkinson's disease ([place])   . skin cancer     Family History  Problem Relation Age of Onset  . Stroke Mother   . Heart attack Father   . Rheum arthritis Sister   . Lung disease Brother   . Diabetes Maternal Grandmother   . Diabetes Paternal Grandmother   . Leukemia Sister    Past Surgical History:  Procedure Laterality Date  . ABDOMINAL HYSTERECTOMY    . bladder tack    . SKIN CANCER EXCISION  11/2015  . SQUAMOUS CELL CARCINOMA EXCISION  03/2018   right arm   Social History   Social History Narrative  . Not on file    There is no immunization history on file for this patient.   Objective: Vital Signs: BP 131/84 (BP Location: Left Arm, Patient Position: Sitting, Cuff Size: Normal)   Pulse 72   Resp 18   Ht 5\' 1"  (1.549 m)   Wt 144 lb 6.4 oz (  65.5 kg)   BMI 27.28 kg/m    Physical Exam Vitals signs and nursing note reviewed.  Constitutional:      Appearance: She is well-developed.  HENT:     Head: Normocephalic and atraumatic.  Eyes:     Conjunctiva/sclera: Conjunctivae normal.  Neck:     Musculoskeletal: Normal range of motion.  Cardiovascular:     Rate and Rhythm: Normal rate and regular rhythm.     Heart sounds: Normal heart sounds.  Pulmonary:     Effort: Pulmonary effort is normal.     Breath sounds: Normal breath sounds.  Abdominal:     General: Bowel sounds are  normal.     Palpations: Abdomen is soft.  Lymphadenopathy:     Cervical: No cervical adenopathy.  Skin:    General: Skin is warm and dry.     Capillary Refill: Capillary refill takes less than 2 seconds.  Neurological:     Mental Status: She is alert and oriented to person, place, and time.  Psychiatric:        Behavior: Behavior normal.      Musculoskeletal Exam: C-spine,thoracic spine, and lumbar spine good ROM.  No midline spinal tenderness.  No SI joint tenderness.  Shoulder joints, elbow joints, wrist joints, MCPs, PIPs, and DIPs good ROM with no synovitis.  PIP and DIP synovial thickening.  Bilateral CMC joint synovial thickening.  Hip joints good ROM with no discomfort.  Right knee pain with ROM.  Right knee warmth and inflammation noted.  Left knee has good ROM with no warmth or effusion.  Ankle joints have good ROM with no tenderness or inflammation.  Overcrowding of toes  Synovial thickening, erythema, and tenderness of the right 1st MTP joint noted.   CDAI Exam: CDAI Score: 2.6  Patient Global: 3 mm; Provider Global: 3 mm Swollen: 2 ; Tender: 2  Joint Exam      Right  Left  Knee  Swollen Tender     MTP 1  Swollen Tender        Investigation: No additional findings.  Imaging: No results found.  Recent Labs: Lab Results  Component Value Date   WBC 5.4 07/20/2018   HGB 13.6 07/20/2018   PLT 275 07/20/2018   NA 135 07/20/2018   K 4.4 07/20/2018   CL 98 07/20/2018   CO2 27 07/20/2018   GLUCOSE 86 07/20/2018   BUN 13 07/20/2018   CREATININE 0.68 07/20/2018   BILITOT 0.3 07/20/2018   ALKPHOS 92 07/20/2018   AST 18 07/20/2018   ALT 4 07/20/2018   PROT 5.9 (L) 07/20/2018   ALBUMIN 4.3 07/20/2018   CALCIUM 9.8 07/20/2018   GFRAA 102 07/20/2018   QFTBGOLDPLUS NEGATIVE 01/23/2018    Speciality Comments: PLQ Eye Exam: 12/19/17 WNL @ [facility] up in 1 year Immunosupressant Lab Work: TB gold-negative 09/04/13 Hepatitis panel- negative except Hep A  IgM antibody 09/04/13  Procedures:  No procedures performed Allergies: Gabapentin   Assessment / Plan:     Visit Diagnoses: Rheumatoid arthritis involving multiple sites with positive rheumatoid factor ([place]) - She presents today with right knee joint warmth and inflammation.  She has pain with ROM and has been having difficulty with ambulation.  X-rays of the right knee joint were obtained on 09/19/18 which revealed mild osteoarthritis and severe chrondromalacia patella.  She had a right knee joint cortisone injection on 09/19/18, which was ineffective.  She has right 1st MTP joint tenderness and inflammation on exam.  Overcrowding  and overlap of toes noted. She has no other joint pain or joint swelling at this time.  She has been taking MTX 4 tablets by mouth once weekly and PLQ 200 mg 1 tablet BID M-F.  She has not missed any doses.  We dicussed increasing the dose of MTX to 6 tablets by mouth once weekly.  She will return for lab work in 2 weeks to monitor for drug toxicity.  She was advised to notify us if her increased joint pain is persistent or worsens.  She will follow up in 3 months.   High risk medication use - methotrexate 6 tablets every 7 days, Plaquenil 200 mg twice daily Monday through Friday only, and folic acid 1 mg daily.  Last Plaquenil eye exam normal on 12/19/17.  CBC and CMP will be drawn today, 2 weeks, and every 3 months to monitor for drug toxicity. - Plan: CBC with Differential/Platelet, COMPLETE METABOLIC PANEL WITH GFR  Primary osteoarthritis of both hands -She has PIP and DIP synovial thickening consistent with osteoarthritis of both hands.  She has bilateral CMC joint synovial thickening.    Primary osteoarthritis of both knees -She presents today with right knee joint pain and inflammation.  She continues to have intermittent flares in the right knee joint.  She had x-rays of the right knee joint on 09/19/18 that revealed mild OA and severe chondromalacia patella.  She has a  cortisone injection on 09/19/18, which did not provide any relief.  She was encouraged to use voltaren gel topically as needed for pain relief.  Her left knee has good ROM with no warmth or effusion.   Chondromalacia of both patellae  Primary osteoarthritis of both feet-She has overcrowding and overlapping of toes.  She has synovial thickening, tenderness, and erythema of the right 1st MTP joint. She has chronic pain in both feet.  She wears proper fitting shoes.   Other medical conditions are listed as follows:   History of TIA (transient ischemic attack)   History of tremor/ Parkinsons dx.   History of hyperlipidemia   Orders: Orders Placed This Encounter  Procedures  . CBC with Differential/Platelet  . COMPLETE METABOLIC PANEL WITH GFR   No orders of the defined types were placed in this encounter.   Face-to-face time spent with patient was 30 minutes. Greater than 50% of time was spent in counseling and coordination of care.  Follow-Up Instructions: Return in about 3 months (around 02/20/2019) for Rheumatoid arthritis, Osteoarthritis.   Ofilia Neas, PA-C  Note - This record has been created using Dragon software.  Chart creation errors have been sought, but may not always  have been located. Such creation errors do not reflect on  the standard of medical care.

## 2018-11-21 DIAGNOSIS — L814 Other melanin hyperpigmentation: Secondary | ICD-10-CM | POA: Diagnosis not present

## 2018-11-21 DIAGNOSIS — D225 Melanocytic nevi of trunk: Secondary | ICD-10-CM | POA: Diagnosis not present

## 2018-11-21 DIAGNOSIS — L821 Other seborrheic keratosis: Secondary | ICD-10-CM | POA: Diagnosis not present

## 2018-11-21 DIAGNOSIS — L570 Actinic keratosis: Secondary | ICD-10-CM | POA: Diagnosis not present

## 2018-11-21 DIAGNOSIS — D1801 Hemangioma of skin and subcutaneous tissue: Secondary | ICD-10-CM | POA: Diagnosis not present

## 2018-11-21 DIAGNOSIS — B079 Viral wart, unspecified: Secondary | ICD-10-CM | POA: Diagnosis not present

## 2018-11-21 DIAGNOSIS — L579 Skin changes due to chronic exposure to nonionizing radiation, unspecified: Secondary | ICD-10-CM | POA: Diagnosis not present

## 2018-11-21 DIAGNOSIS — Z85828 Personal history of other malignant neoplasm of skin: Secondary | ICD-10-CM | POA: Diagnosis not present

## 2018-11-21 LAB — COMPLETE METABOLIC PANEL WITH GFR
AG Ratio: 2 (calc) (ref 1.0–2.5)
ALT: 8 U/L (ref 6–29)
AST: 20 U/L (ref 10–35)
Albumin: 4.2 g/dL (ref 3.6–5.1)
Alkaline phosphatase (APISO): 73 U/L (ref 37–153)
BUN: 10 mg/dL (ref 7–25)
CO2: 33 mmol/L — ABNORMAL HIGH (ref 20–32)
Calcium: 9.9 mg/dL (ref 8.6–10.4)
Chloride: 97 mmol/L — ABNORMAL LOW (ref 98–110)
Creat: 0.8 mg/dL (ref 0.60–0.93)
GFR, Est African American: 85 mL/min/{1.73_m2} (ref >=60)
GFR, Est Non African American: 74 mL/min/{1.73_m2} (ref >=60)
Globulin: 2.1 g/dL (calc) (ref 1.9–3.7)
Glucose, Bld: 79 mg/dL (ref 65–99)
Potassium: 4.5 mmol/L (ref 3.5–5.3)
Sodium: 134 mmol/L — ABNORMAL LOW (ref 135–146)
Total Bilirubin: 0.5 mg/dL (ref 0.2–1.2)
Total Protein: 6.3 g/dL (ref 6.1–8.1)

## 2018-11-21 LAB — CBC WITH DIFFERENTIAL/PLATELET
Absolute Monocytes: 422 cells/uL (ref 200–950)
Basophils Absolute: 81 cells/uL (ref 0–200)
Basophils Relative: 1.3 %
Eosinophils Absolute: 223 cells/uL (ref 15–500)
Eosinophils Relative: 3.6 %
HCT: 39.3 % (ref 35.0–45.0)
Hemoglobin: 13.4 g/dL (ref 11.7–15.5)
Lymphs Abs: 1910 cells/uL (ref 850–3900)
MCH: 29.4 pg (ref 27.0–33.0)
MCHC: 34.1 g/dL (ref 32.0–36.0)
MCV: 86.2 fL (ref 80.0–100.0)
MPV: 10.1 fL (ref 7.5–12.5)
Monocytes Relative: 6.8 %
Neutro Abs: 3565 cells/uL (ref 1500–7800)
Neutrophils Relative %: 57.5 %
Platelets: 276 10*3/uL (ref 140–400)
RBC: 4.56 10*6/uL (ref 3.80–5.10)
RDW: 13.3 % (ref 11.0–15.0)
Total Lymphocyte: 30.8 %
WBC: 6.2 10*3/uL (ref 3.8–10.8)

## 2018-11-21 NOTE — Progress Notes (Signed)
CBC WNL.  CMP stable.

## 2018-11-23 ENCOUNTER — Other Ambulatory Visit: Payer: Self-pay | Admitting: Rheumatology

## 2018-11-26 ENCOUNTER — Other Ambulatory Visit: Payer: Self-pay | Admitting: Rheumatology

## 2018-11-26 NOTE — Telephone Encounter (Signed)
Last Visit: 11/20/18 Next Visit: 02/20/19  Okay to refill per Dr. Estanislado Pandy

## 2018-12-01 ENCOUNTER — Other Ambulatory Visit: Payer: Self-pay | Admitting: Rheumatology

## 2018-12-01 DIAGNOSIS — M0579 Rheumatoid arthritis with rheumatoid factor of multiple sites without organ or systems involvement: Secondary | ICD-10-CM

## 2018-12-03 ENCOUNTER — Telehealth: Payer: Self-pay | Admitting: Rheumatology

## 2018-12-03 ENCOUNTER — Other Ambulatory Visit: Payer: Self-pay | Admitting: *Deleted

## 2018-12-03 DIAGNOSIS — Z79899 Other long term (current) drug therapy: Secondary | ICD-10-CM

## 2018-12-03 NOTE — Telephone Encounter (Signed)
Lab Orders released.  

## 2018-12-03 NOTE — Telephone Encounter (Signed)
Patient going to [facility] in [place] for labs this week. Please release orders.

## 2018-12-03 NOTE — Telephone Encounter (Addendum)
Too soon for refill.

## 2018-12-04 DIAGNOSIS — Z1331 Encounter for screening for depression: Secondary | ICD-10-CM | POA: Diagnosis not present

## 2018-12-04 DIAGNOSIS — Z0000 Encounter for general adult medical examination without abnormal findings: Secondary | ICD-10-CM | POA: Diagnosis not present

## 2018-12-04 DIAGNOSIS — E785 Hyperlipidemia, unspecified: Secondary | ICD-10-CM | POA: Diagnosis not present

## 2018-12-04 DIAGNOSIS — Z1231 Encounter for screening mammogram for malignant neoplasm of breast: Secondary | ICD-10-CM | POA: Diagnosis not present

## 2018-12-04 DIAGNOSIS — Z139 Encounter for screening, unspecified: Secondary | ICD-10-CM | POA: Diagnosis not present

## 2018-12-04 DIAGNOSIS — Z9181 History of falling: Secondary | ICD-10-CM | POA: Diagnosis not present

## 2018-12-04 DIAGNOSIS — N959 Unspecified menopausal and perimenopausal disorder: Secondary | ICD-10-CM | POA: Diagnosis not present

## 2018-12-05 DIAGNOSIS — Z79899 Other long term (current) drug therapy: Secondary | ICD-10-CM | POA: Diagnosis not present

## 2018-12-06 LAB — CMP14+EGFR
ALT: 11 IU/L (ref 0–32)
AST: 20 IU/L (ref 0–40)
Albumin/Globulin Ratio: 2.1 (ref 1.2–2.2)
Albumin: 4.2 g/dL (ref 3.7–4.7)
Alkaline Phosphatase: 83 IU/L (ref 39–117)
BUN/Creatinine Ratio: 15 (ref 12–28)
BUN: 11 mg/dL (ref 8–27)
Bilirubin Total: 0.3 mg/dL (ref 0.0–1.2)
CO2: 23 mmol/L (ref 20–29)
Calcium: 9.3 mg/dL (ref 8.7–10.3)
Chloride: 100 mmol/L (ref 96–106)
Creatinine, Ser: 0.74 mg/dL (ref 0.57–1.00)
GFR calc Af Amer: 94 mL/min/{1.73_m2} (ref >59)
GFR calc non Af Amer: 81 mL/min/{1.73_m2} (ref >59)
Globulin, Total: 2 g/dL (ref 1.5–4.5)
Glucose: 95 mg/dL (ref 65–99)
Potassium: 4.3 mmol/L (ref 3.5–5.2)
Sodium: 137 mmol/L (ref 134–144)
Total Protein: 6.2 g/dL (ref 6.0–8.5)

## 2018-12-06 LAB — CBC WITH DIFFERENTIAL/PLATELET
Basophils Absolute: 0.1 x10E3/uL (ref 0.0–0.2)
Basophils Absolute: 0.1 10*3/uL (ref 0.0–0.2)
Basos: 2 %
Basos: 1 %
EOS (ABSOLUTE): 0.3 x10E3/uL (ref 0.0–0.4)
EOS (ABSOLUTE): 0.3 10*3/uL (ref 0.0–0.4)
Eos: 4 %
Eos: 4 %
Hematocrit: 38.3 % (ref 34.0–46.6)
Hematocrit: 37.6 % (ref 34.0–46.6)
Hemoglobin: 13.3 g/dL (ref 11.1–15.9)
Hemoglobin: 13.2 g/dL (ref 11.1–15.9)
Immature Grans (Abs): 0 x10E3/uL (ref 0.0–0.1)
Immature Grans (Abs): 0 10*3/uL (ref 0.0–0.1)
Immature Granulocytes: 0 %
Immature Granulocytes: 0 %
Lymphocytes Absolute: 1.9 x10E3/uL (ref 0.7–3.1)
Lymphocytes Absolute: 1.9 10*3/uL (ref 0.7–3.1)
Lymphs: 31 %
Lymphs: 31 %
MCH: 29.2 pg (ref 26.6–33.0)
MCH: 29.5 pg (ref 26.6–33.0)
MCHC: 34.7 g/dL (ref 31.5–35.7)
MCHC: 35.1 g/dL (ref 31.5–35.7)
MCV: 84 fL (ref 79–97)
MCV: 84 fL (ref 79–97)
Monocytes Absolute: 0.5 x10E3/uL (ref 0.1–0.9)
Monocytes Absolute: 0.4 10*3/uL (ref 0.1–0.9)
Monocytes: 8 %
Monocytes: 7 %
Neutrophils Absolute: 3.4 x10E3/uL (ref 1.4–7.0)
Neutrophils Absolute: 3.5 10*3/uL (ref 1.4–7.0)
Neutrophils: 55 %
Neutrophils: 57 %
Platelets: 285 x10E3/uL (ref 150–450)
Platelets: 282 10*3/uL (ref 150–450)
RBC: 4.55 x10E6/uL (ref 3.77–5.28)
RBC: 4.47 x10E6/uL (ref 3.77–5.28)
RDW: 12.7 % (ref 11.7–15.4)
RDW: 12.9 % (ref 11.7–15.4)
WBC: 6.2 x10E3/uL (ref 3.4–10.8)
WBC: 6.3 10*3/uL (ref 3.4–10.8)

## 2018-12-06 NOTE — Progress Notes (Signed)
CBC and CMP WNL

## 2018-12-11 DIAGNOSIS — M2041 Other hammer toe(s) (acquired), right foot: Secondary | ICD-10-CM | POA: Diagnosis not present

## 2018-12-11 DIAGNOSIS — M205X1 Other deformities of toe(s) (acquired), right foot: Secondary | ICD-10-CM | POA: Diagnosis not present

## 2018-12-11 DIAGNOSIS — M79674 Pain in right toe(s): Secondary | ICD-10-CM | POA: Diagnosis not present

## 2018-12-11 DIAGNOSIS — M2042 Other hammer toe(s) (acquired), left foot: Secondary | ICD-10-CM | POA: Diagnosis not present

## 2018-12-25 DIAGNOSIS — H5203 Hypermetropia, bilateral: Secondary | ICD-10-CM | POA: Diagnosis not present

## 2018-12-25 DIAGNOSIS — H40003 Preglaucoma, unspecified, bilateral: Secondary | ICD-10-CM | POA: Diagnosis not present

## 2018-12-25 DIAGNOSIS — H25813 Combined forms of age-related cataract, bilateral: Secondary | ICD-10-CM | POA: Diagnosis not present

## 2018-12-25 DIAGNOSIS — H524 Presbyopia: Secondary | ICD-10-CM | POA: Diagnosis not present

## 2018-12-25 DIAGNOSIS — H40013 Open angle with borderline findings, low risk, bilateral: Secondary | ICD-10-CM | POA: Diagnosis not present

## 2018-12-25 DIAGNOSIS — H52223 Regular astigmatism, bilateral: Secondary | ICD-10-CM | POA: Diagnosis not present

## 2018-12-25 DIAGNOSIS — H47233 Glaucomatous optic atrophy, bilateral: Secondary | ICD-10-CM | POA: Diagnosis not present

## 2018-12-26 DIAGNOSIS — M05761 Rheumatoid arthritis with rheumatoid factor of right knee without organ or systems involvement: Secondary | ICD-10-CM | POA: Diagnosis not present

## 2018-12-26 DIAGNOSIS — E785 Hyperlipidemia, unspecified: Secondary | ICD-10-CM | POA: Diagnosis not present

## 2018-12-26 DIAGNOSIS — Z01818 Encounter for other preprocedural examination: Secondary | ICD-10-CM | POA: Diagnosis not present

## 2018-12-26 DIAGNOSIS — M79674 Pain in right toe(s): Secondary | ICD-10-CM | POA: Diagnosis not present

## 2018-12-26 DIAGNOSIS — Z131 Encounter for screening for diabetes mellitus: Secondary | ICD-10-CM | POA: Diagnosis not present

## 2018-12-26 DIAGNOSIS — M21611 Bunion of right foot: Secondary | ICD-10-CM | POA: Diagnosis not present

## 2018-12-26 DIAGNOSIS — M2011 Hallux valgus (acquired), right foot: Secondary | ICD-10-CM | POA: Diagnosis not present

## 2018-12-26 DIAGNOSIS — K589 Irritable bowel syndrome without diarrhea: Secondary | ICD-10-CM | POA: Diagnosis not present

## 2018-12-26 DIAGNOSIS — M2021 Hallux rigidus, right foot: Secondary | ICD-10-CM | POA: Diagnosis not present

## 2018-12-26 DIAGNOSIS — G20 Parkinson's disease: Secondary | ICD-10-CM | POA: Diagnosis not present

## 2018-12-26 DIAGNOSIS — M205X1 Other deformities of toe(s) (acquired), right foot: Secondary | ICD-10-CM | POA: Diagnosis not present

## 2018-12-31 DIAGNOSIS — Z20828 Contact with and (suspected) exposure to other viral communicable diseases: Secondary | ICD-10-CM | POA: Diagnosis not present

## 2018-12-31 DIAGNOSIS — M2021 Hallux rigidus, right foot: Secondary | ICD-10-CM | POA: Diagnosis not present

## 2018-12-31 DIAGNOSIS — Z01812 Encounter for preprocedural laboratory examination: Secondary | ICD-10-CM | POA: Diagnosis not present

## 2018-12-31 DIAGNOSIS — M2011 Hallux valgus (acquired), right foot: Secondary | ICD-10-CM | POA: Diagnosis not present

## 2018-12-31 DIAGNOSIS — M205X1 Other deformities of toe(s) (acquired), right foot: Secondary | ICD-10-CM | POA: Diagnosis not present

## 2019-01-04 DIAGNOSIS — M205X1 Other deformities of toe(s) (acquired), right foot: Secondary | ICD-10-CM | POA: Diagnosis not present

## 2019-01-04 DIAGNOSIS — M2041 Other hammer toe(s) (acquired), right foot: Secondary | ICD-10-CM | POA: Diagnosis not present

## 2019-01-04 DIAGNOSIS — I10 Essential (primary) hypertension: Secondary | ICD-10-CM | POA: Diagnosis not present

## 2019-01-04 DIAGNOSIS — R0602 Shortness of breath: Secondary | ICD-10-CM | POA: Diagnosis not present

## 2019-01-04 DIAGNOSIS — R682 Dry mouth, unspecified: Secondary | ICD-10-CM | POA: Diagnosis not present

## 2019-01-04 DIAGNOSIS — R0902 Hypoxemia: Secondary | ICD-10-CM | POA: Diagnosis not present

## 2019-01-04 DIAGNOSIS — R609 Edema, unspecified: Secondary | ICD-10-CM | POA: Diagnosis not present

## 2019-01-04 DIAGNOSIS — M2011 Hallux valgus (acquired), right foot: Secondary | ICD-10-CM | POA: Diagnosis not present

## 2019-01-04 DIAGNOSIS — R069 Unspecified abnormalities of breathing: Secondary | ICD-10-CM | POA: Diagnosis not present

## 2019-01-04 DIAGNOSIS — M2021 Hallux rigidus, right foot: Secondary | ICD-10-CM | POA: Diagnosis not present

## 2019-01-06 DIAGNOSIS — I252 Old myocardial infarction: Secondary | ICD-10-CM | POA: Diagnosis not present

## 2019-01-06 DIAGNOSIS — R9431 Abnormal electrocardiogram [ECG] [EKG]: Secondary | ICD-10-CM | POA: Diagnosis not present

## 2019-02-05 DIAGNOSIS — M2011 Hallux valgus (acquired), right foot: Secondary | ICD-10-CM | POA: Diagnosis not present

## 2019-02-05 DIAGNOSIS — M2021 Hallux rigidus, right foot: Secondary | ICD-10-CM | POA: Diagnosis not present

## 2019-02-05 DIAGNOSIS — M205X1 Other deformities of toe(s) (acquired), right foot: Secondary | ICD-10-CM | POA: Diagnosis not present

## 2019-02-06 NOTE — Progress Notes (Signed)
Virtual Visit via Telephone Note  I connected with Dawn Harrell on 02/20/19 at  3:45 PM EST by telephone and verified that I am speaking with the correct person using two identifiers.  Location: Patient: Home  Provider: Clinic  This service was conducted via virtual visit.  The patient was located at home. I was located in my office.  Consent was obtained prior to the virtual visit and is aware of possible charges through their insurance for this visit.  The patient is an established patient.  Dr. Estanislado Pandy, MD conducted the virtual visit and Hazel Sams, PA-C acted as scribe during the service.  Office staff helped with scheduling follow up visits after the service was conducted.   I discussed the limitations, risks, security and privacy concerns of performing an evaluation and management service by telephone and the availability of in person appointments. I also discussed with the patient that there may be a patient responsible charge related to this service. The patient expressed understanding and agreed to proceed.  CC: Medication monitoring  History of Present Illness: Patient is a 73 year old female with a past medical history of seropositive rheumatoid arthritis and osteoarthritis.  She is taking  plaquenil 200 mg 1 tablet BID M-F.  She states she has not taken MTX since September 2020 due to her insurance not covering it.  She denies any recent RA flares.  She has occasional right knee joint pain.  She had a right knee joint cortisone injection on 09/19/18.  She states she had correction of the right hammertoe on 01/04/19 by Dr. Delora Fuel. She is still unable to drive and is wearing a boot. She denies any complications or infections.   Review of Systems  Constitutional: Negative for fever and malaise/fatigue.  Eyes: Negative for photophobia, pain, discharge and redness.  Respiratory: Negative for cough, shortness of breath and wheezing.   Cardiovascular: Negative for chest pain and palpitations.   Gastrointestinal: Negative for blood in stool, constipation and diarrhea.  Genitourinary: Negative for dysuria.  Musculoskeletal: Positive for back pain and joint pain. Negative for myalgias and neck pain.       +Morning stiffness   Skin: Negative for rash.  Neurological: Negative for dizziness and headaches.  Psychiatric/Behavioral: Positive for depression. The patient is not nervous/anxious and does not have insomnia.       Observations/Objective: Physical Exam  Constitutional: She is oriented to person, place, and time.  Neurological: She is alert and oriented to person, place, and time.  Psychiatric: Mood, memory, affect and judgment normal.   Patient reports morning stiffness for 5   minutes.   Patient reports nocturnal pain.  Difficulty dressing/grooming: Denies Difficulty climbing stairs: Reports Difficulty getting out of chair: Reports Difficulty using hands for taps, buttons, cutlery, and/or writing: Denies  She denies any new allergies or new medications since her last visit.    Assessment and Plan: Visit Diagnoses: Rheumatoid arthritis involving multiple sites with positive rheumatoid factor ([place]) - She has not had any recent rheumatoid arthritis flares. She is clinically doing well on Plaquenil 200 mg 1 tablet by mouth twice daily M-F.  She discontinued MTX in September (according to the patient her insurance would no longer cover MTX).  She underwent arthrodesis of right MTP by Dr. Delora Fuel on 01/04/19.  She has not had any recent infections or complications.  She continues to wear a boot and has been unable to drive.  She does not want to restart on MTX at this time.  She will  continue taking PLQ as prescribed.  She was advised to notify us if she develops increased joint pain or joint swelling. She will follow up in 3 months.   High risk medication use: Plaquenil 200 mg twice daily Monday through Friday only. Last Plaquenil eye exam normal on 12/19/17.  CBC and CMP WNL on  12/05/18.  We will obtain routine lab work at her follow up visit in 3 months.   Primary osteoarthritis of both hands -She has no pain or joint swelling in her hands at this time.  Joint protection and muscle strengthening were discussed.   Primary osteoarthritis of both knees -  She had x-rays of the right knee joint on 09/19/18 that revealed mild OA and severe chondromalacia patella.  She had a cortisone injection on 09/19/18.  She has occasional discomfort in the right knee joint but denies any joint swelling.   Chondromalacia of both patellae  Primary osteoarthritis of both feet-She has surgery on the right foot performed by Dr. Delora Fuel on 01/04/19 for correction of hammertoes and crossover of toes.  She has not had any complications.  She continues to wear a boot and has been unable to drive.  She denies any pain in the left foot at this time.   Other medical conditions are listed as follows:   History of TIA (transient ischemic attack)   History of tremor/ Parkinsons dx.   History of hyperlipidemia   Follow Up Instructions: She will follow up in 3 months.    I discussed the assessment and treatment plan with the patient. The patient was provided an opportunity to ask questions and all were answered. The patient agreed with the plan and demonstrated an understanding of the instructions.   The patient was advised to call back or seek an in-person evaluation if the symptoms worsen or if the condition fails to improve as anticipated.  I provided 15 minutes of non-face-to-face time during this encounter.   Bo Merino, MD   Scribed by- Hazel Sams, PA-C

## 2019-02-08 DIAGNOSIS — J069 Acute upper respiratory infection, unspecified: Secondary | ICD-10-CM | POA: Diagnosis not present

## 2019-02-20 ENCOUNTER — Other Ambulatory Visit: Payer: Self-pay

## 2019-02-20 ENCOUNTER — Telehealth (INDEPENDENT_AMBULATORY_CARE_PROVIDER_SITE_OTHER): Payer: Medicare HMO | Admitting: Rheumatology

## 2019-02-20 ENCOUNTER — Encounter: Payer: Self-pay | Admitting: Rheumatology

## 2019-02-20 ENCOUNTER — Ambulatory Visit: Payer: Medicare HMO | Admitting: Rheumatology

## 2019-02-20 DIAGNOSIS — M19071 Primary osteoarthritis, right ankle and foot: Secondary | ICD-10-CM

## 2019-02-20 DIAGNOSIS — M170 Bilateral primary osteoarthritis of knee: Secondary | ICD-10-CM

## 2019-02-20 DIAGNOSIS — M19041 Primary osteoarthritis, right hand: Secondary | ICD-10-CM

## 2019-02-20 DIAGNOSIS — Z8673 Personal history of transient ischemic attack (TIA), and cerebral infarction without residual deficits: Secondary | ICD-10-CM | POA: Diagnosis not present

## 2019-02-20 DIAGNOSIS — M2242 Chondromalacia patellae, left knee: Secondary | ICD-10-CM

## 2019-02-20 DIAGNOSIS — M0579 Rheumatoid arthritis with rheumatoid factor of multiple sites without organ or systems involvement: Secondary | ICD-10-CM | POA: Diagnosis not present

## 2019-02-20 DIAGNOSIS — M2241 Chondromalacia patellae, right knee: Secondary | ICD-10-CM

## 2019-02-20 DIAGNOSIS — Z8639 Personal history of other endocrine, nutritional and metabolic disease: Secondary | ICD-10-CM

## 2019-02-20 DIAGNOSIS — M19072 Primary osteoarthritis, left ankle and foot: Secondary | ICD-10-CM

## 2019-02-20 DIAGNOSIS — Z79899 Other long term (current) drug therapy: Secondary | ICD-10-CM | POA: Diagnosis not present

## 2019-02-20 DIAGNOSIS — M19042 Primary osteoarthritis, left hand: Secondary | ICD-10-CM

## 2019-02-20 DIAGNOSIS — Z8669 Personal history of other diseases of the nervous system and sense organs: Secondary | ICD-10-CM | POA: Diagnosis not present

## 2019-02-25 ENCOUNTER — Telehealth: Payer: Self-pay | Admitting: Rheumatology

## 2019-02-25 DIAGNOSIS — M0579 Rheumatoid arthritis with rheumatoid factor of multiple sites without organ or systems involvement: Secondary | ICD-10-CM

## 2019-02-25 MED ORDER — HYDROXYCHLOROQUINE SULFATE 200 MG PO TABS: ORAL_TABLET | ORAL | 0 refills | Status: DC

## 2019-02-25 NOTE — Telephone Encounter (Signed)
Last Visit: 11/20/18 Next Visit: 05/23/19 Labs: 12/05/18 WNL PLQ Eye Exam: 12/25/18 WNL  Okay to refill per Dr. Estanislado Pandy

## 2019-02-25 NOTE — Addendum Note (Signed)
Addended by: Carole Binning on: 02/25/2019 03:28 PM   Modules accepted: Orders

## 2019-02-25 NOTE — Telephone Encounter (Signed)
Patient called stating she called [facility] for refill of her prescription of Hydroxychloroquine and was told her prescription has been "inactivated."  Patient states she thought she had another bottle so waited before she called for the refill and now only has 2 pills remaining for today.

## 2019-03-05 DIAGNOSIS — M2021 Hallux rigidus, right foot: Secondary | ICD-10-CM | POA: Diagnosis not present

## 2019-03-05 DIAGNOSIS — M2011 Hallux valgus (acquired), right foot: Secondary | ICD-10-CM | POA: Diagnosis not present

## 2019-03-05 DIAGNOSIS — Z9889 Other specified postprocedural states: Secondary | ICD-10-CM | POA: Diagnosis not present

## 2019-03-05 DIAGNOSIS — M205X1 Other deformities of toe(s) (acquired), right foot: Secondary | ICD-10-CM | POA: Diagnosis not present

## 2019-03-08 DIAGNOSIS — M85852 Other specified disorders of bone density and structure, left thigh: Secondary | ICD-10-CM | POA: Diagnosis not present

## 2019-03-08 DIAGNOSIS — Z1231 Encounter for screening mammogram for malignant neoplasm of breast: Secondary | ICD-10-CM | POA: Diagnosis not present

## 2019-03-08 DIAGNOSIS — N959 Unspecified menopausal and perimenopausal disorder: Secondary | ICD-10-CM | POA: Diagnosis not present

## 2019-03-27 DIAGNOSIS — H40013 Open angle with borderline findings, low risk, bilateral: Secondary | ICD-10-CM | POA: Diagnosis not present

## 2019-03-27 DIAGNOSIS — H47233 Glaucomatous optic atrophy, bilateral: Secondary | ICD-10-CM | POA: Diagnosis not present

## 2019-04-17 ENCOUNTER — Other Ambulatory Visit: Payer: Self-pay | Admitting: Rheumatology

## 2019-04-17 DIAGNOSIS — M0579 Rheumatoid arthritis with rheumatoid factor of multiple sites without organ or systems involvement: Secondary | ICD-10-CM

## 2019-04-29 ENCOUNTER — Telehealth: Payer: Self-pay | Admitting: Rheumatology

## 2019-04-29 DIAGNOSIS — M0579 Rheumatoid arthritis with rheumatoid factor of multiple sites without organ or systems involvement: Secondary | ICD-10-CM

## 2019-04-29 MED ORDER — HYDROXYCHLOROQUINE SULFATE 200 MG PO TABS: ORAL_TABLET | ORAL | 0 refills | Status: DC

## 2019-04-29 NOTE — Addendum Note (Signed)
Addended by: Carole Binning on: 04/29/2019 03:52 PM   Modules accepted: Orders

## 2019-04-29 NOTE — Telephone Encounter (Signed)
Last Visit: 02/20/19 Next Visit: 05/23/19 Labs: 12/05/18 CBC WNL. CMP stable. PLQ Eye Exam: 12/25/18 WNL  Okay to refill per Dr. Estanislado Pandy

## 2019-04-29 NOTE — Telephone Encounter (Signed)
Patient called requesting prescription refill of Plaquenil to be sent to [facility].

## 2019-05-15 NOTE — Progress Notes (Signed)
Virtual Visit via Telephone Note  I connected with Dawn Harrell on 05/22/19 at 11:00 AM EST by telephone and verified that I am speaking with the correct person using two identifiers.  Location: Patient: Home  Provider: Clinic This service was conducted via virtual visit.  The patient was located at home. I was located in my office.  Consent was obtained prior to the virtual visit and is aware of possible charges through their insurance for this visit.  The patient is an established patient.  Dr. Estanislado Pandy, MD conducted the virtual visit and Hazel Sams, PA-C acted as scribe during the service.  Office staff helped with scheduling follow up visits after the service was conducted.      I discussed the limitations, risks, security and privacy concerns of performing an evaluation and management service by telephone and the availability of in person appointments. I also discussed with the patient that there may be a patient responsible charge related to this service. The patient expressed understanding and agreed to proceed.  CC: Left knee joint pain  History of Present Illness: Patient is a 74 year old female with a past medical history of seropositive rheumatoid arthritis and osteoarthritis. She is taking plaquenil 200 mg 1 tablet BID M-F.  She discontinued MTX due to insurance issues. She is currently having left knee joint pain and joint swelling.  She states her right knee joint pain and swelling resolved after the cortisone injection on 09/19/18.  She denies any other joint pain or joint swelling.   Review of Systems  Constitutional: Positive for malaise/fatigue. Negative for fever.  HENT: Negative for congestion.   Eyes: Negative for photophobia, pain, discharge and redness.  Respiratory: Negative for cough, shortness of breath and wheezing.   Cardiovascular: Negative for chest pain, palpitations and leg swelling.  Gastrointestinal: Negative for blood in stool, constipation and diarrhea.   Genitourinary: Negative for dysuria and frequency.  Musculoskeletal: Positive for joint pain. Negative for back pain, myalgias and neck pain.  Skin: Negative for rash.  Neurological: Negative for dizziness, weakness and headaches.  Psychiatric/Behavioral: Negative for depression and memory loss. The patient is not nervous/anxious and does not have insomnia.       Observations/Objective: Physical Exam  Constitutional: She is oriented to person, place, and time.  Neurological: She is alert and oriented to person, place, and time.  Psychiatric: Mood, memory, affect and judgment normal.   Patient reports morning stiffness for 20 minutes.   Patient denies nocturnal pain.  Difficulty dressing/grooming: Denies Difficulty climbing stairs: Reports Difficulty getting out of chair: Reports Difficulty using hands for taps, buttons, cutlery, and/or writing: Denies   Assessment and Plan: Visit Diagnoses: Rheumatoid arthritis involving multiple sites with positive rheumatoid factor ([place]) - She is currently experiencing left knee joint pain and inflammation, which started several weeks ago.  Her right knee joint pain and inflammation resolved after the cortisone injection on 09/19/18.  She would like to schedule a left knee joint cortisone injection and we will update x-rays at that visit.  She has no other joint pain or joint inflammation at this time.  She is taking plaquenil 200 mg 1 tablet by mouth twice daily M-F.  She will continue on the current treatment regimen.  She was advised to notify us if she develops increased joint pain or joint swelling. She will follow up in 3 months.   High risk medication use -Plaquenil 200 mg twice daily Monday through Friday only. PLQ Eye Exam: 12/25/18 WNL @ [facility] up in 1 year.  CBC and CMP are overdue. Standing labs are in place.   She was encouraged to receive the covid-19 vaccine once it is available to her.  Primary osteoarthritis of both hands  -She is not having any joint pain or inflammation in her hands at this time.  Joint protection and muscle strengthening were discussed.   Primary osteoarthritis of both knees - She had x-rays of the right knee joint on 09/19/18 that revealed mild OA and severe chondromalacia patella.  She has a cortisone injection on 09/19/18, which resolved her discomfort and inflammation.  She is currently having left knee joint pain and inflammation.  She would like to schedule a left knee joint cortisone injection.  We will update x-rays of the left knee joint at that visit.   Chondromalacia of both patellae  Primary osteoarthritis of both feet-She has chronic pain in both feet.  Other medical conditions are listed as follows:   History of TIA (transient ischemic attack)   History of tremor/ Parkinsons dx.   History of hyperlipidemia    Follow Up Instructions: She will follow up in 3 months   I discussed the assessment and treatment plan with the patient. The patient was provided an opportunity to ask questions and all were answered. The patient agreed with the plan and demonstrated an understanding of the instructions.   The patient was advised to call back or seek an in-person evaluation if the symptoms worsen or if the condition fails to improve as anticipated.  I provided 20 minutes of non-face-to-face time during this encounter.   Bo Merino, MD   Scribed byLovena Le Dale,PA-C

## 2019-05-22 ENCOUNTER — Telehealth (INDEPENDENT_AMBULATORY_CARE_PROVIDER_SITE_OTHER): Payer: Medicare HMO | Admitting: Rheumatology

## 2019-05-22 ENCOUNTER — Encounter: Payer: Self-pay | Admitting: Rheumatology

## 2019-05-22 ENCOUNTER — Other Ambulatory Visit: Payer: Self-pay

## 2019-05-22 DIAGNOSIS — Z8673 Personal history of transient ischemic attack (TIA), and cerebral infarction without residual deficits: Secondary | ICD-10-CM | POA: Diagnosis not present

## 2019-05-22 DIAGNOSIS — M170 Bilateral primary osteoarthritis of knee: Secondary | ICD-10-CM

## 2019-05-22 DIAGNOSIS — Z8669 Personal history of other diseases of the nervous system and sense organs: Secondary | ICD-10-CM

## 2019-05-22 DIAGNOSIS — Z8639 Personal history of other endocrine, nutritional and metabolic disease: Secondary | ICD-10-CM

## 2019-05-22 DIAGNOSIS — M2241 Chondromalacia patellae, right knee: Secondary | ICD-10-CM | POA: Diagnosis not present

## 2019-05-22 DIAGNOSIS — M19042 Primary osteoarthritis, left hand: Secondary | ICD-10-CM

## 2019-05-22 DIAGNOSIS — M0579 Rheumatoid arthritis with rheumatoid factor of multiple sites without organ or systems involvement: Secondary | ICD-10-CM

## 2019-05-22 DIAGNOSIS — M19041 Primary osteoarthritis, right hand: Secondary | ICD-10-CM | POA: Diagnosis not present

## 2019-05-22 DIAGNOSIS — Z79899 Other long term (current) drug therapy: Secondary | ICD-10-CM | POA: Diagnosis not present

## 2019-05-22 DIAGNOSIS — M19071 Primary osteoarthritis, right ankle and foot: Secondary | ICD-10-CM

## 2019-05-22 DIAGNOSIS — M19072 Primary osteoarthritis, left ankle and foot: Secondary | ICD-10-CM

## 2019-05-22 DIAGNOSIS — M2242 Chondromalacia patellae, left knee: Secondary | ICD-10-CM

## 2019-05-23 ENCOUNTER — Ambulatory Visit: Payer: Medicare HMO | Admitting: Rheumatology

## 2019-05-27 DIAGNOSIS — L0390 Cellulitis, unspecified: Secondary | ICD-10-CM | POA: Diagnosis not present

## 2019-05-27 DIAGNOSIS — M2550 Pain in unspecified joint: Secondary | ICD-10-CM | POA: Diagnosis not present

## 2019-05-30 ENCOUNTER — Ambulatory Visit: Payer: Medicare HMO | Admitting: Rheumatology

## 2019-06-07 NOTE — Progress Notes (Deleted)
Office Visit Note  Patient: Dawn Harrell             Date of Birth: 1946-04-13           MRN: [id number]             PCP: Nicoletta Dress, MD Referring: Nicoletta Dress, MD Visit Date: 06/12/2019 Occupation: @GUAROCC @  Subjective:  No chief complaint on file.   History of Present Illness: Dawn Harrell is a 74 y.o. female ***   Activities of Daily Living:  Patient reports morning stiffness for *** {minute/hour:19697}.   Patient {ACTIONS;DENIES/REPORTS:21021675::"Denies"} nocturnal pain.  Difficulty dressing/grooming: {ACTIONS;DENIES/REPORTS:21021675::"Denies"} Difficulty climbing stairs: {ACTIONS;DENIES/REPORTS:21021675::"Denies"} Difficulty getting out of chair: {ACTIONS;DENIES/REPORTS:21021675::"Denies"} Difficulty using hands for taps, buttons, cutlery, and/or writing: {ACTIONS;DENIES/REPORTS:21021675::"Denies"}  No Rheumatology ROS completed.   PMFS History:  Patient Active Problem List   Diagnosis Date Noted  . Chondromalacia of both patellae 10/04/2016  . History of tremor/ Parkinsons dx.  10/04/2016  . History of TIA (transient ischemic attack) 10/04/2016  . History of hyperlipidemia 10/04/2016  . Tremor 03/24/2016  . Rheumatoid arthritis involving multiple sites with positive rheumatoid factor ([place]) 03/24/2016  . Primary osteoarthritis of both hands 03/24/2016  . Primary osteoarthritis of both knees 03/24/2016  . Primary osteoarthritis of both feet 03/24/2016  . High risk medication use 03/24/2016    Past Medical History:  Diagnosis Date  . Arthritis   . Parkinson's disease ([place])   . skin cancer     Family History  Problem Relation Age of Onset  . Stroke Mother   . Heart attack Father   . Rheum arthritis Sister   . Lung disease Brother   . Diabetes Maternal Grandmother   . Diabetes Paternal Grandmother   . Leukemia Sister    Past Surgical History:  Procedure Laterality Date  . ABDOMINAL HYSTERECTOMY    . bladder tack    . FOOT SURGERY     . SKIN CANCER EXCISION  11/2015  . SQUAMOUS CELL CARCINOMA EXCISION  03/2018   right arm   Social History   Social History Narrative  . Not on file    There is no immunization history on file for this patient.   Objective: Vital Signs: There were no vitals taken for this visit.   Physical Exam   Musculoskeletal Exam: ***  CDAI Exam: CDAI Score: - Patient Global: -; Provider Global: - Swollen: -; Tender: - Joint Exam 06/12/2019   No joint exam has been documented for this visit   There is currently no information documented on the homunculus. Go to the Rheumatology activity and complete the homunculus joint exam.  Investigation: No additional findings.  Imaging: No results found.  Recent Labs: Lab Results  Component Value Date   WBC 6.3 12/05/2018   HGB 13.2 12/05/2018   PLT 282 12/05/2018   NA 137 12/05/2018   K 4.3 12/05/2018   CL 100 12/05/2018   CO2 23 12/05/2018   GLUCOSE 95 12/05/2018   BUN 11 12/05/2018   CREATININE 0.74 12/05/2018   BILITOT 0.3 12/05/2018   ALKPHOS 83 12/05/2018   AST 20 12/05/2018   ALT 11 12/05/2018   PROT 6.2 12/05/2018   ALBUMIN 4.2 12/05/2018   CALCIUM 9.3 12/05/2018   GFRAA 94 12/05/2018   QFTBGOLDPLUS NEGATIVE 01/23/2018    Speciality Comments: PLQ Eye Exam: 12/25/18 WNL @ [facility] up in 1 year Immunosupressant Lab Work: TB gold-negative 09/04/13 Hepatitis panel- negative except Hep A IgM antibody 09/04/13  Procedures:  No procedures performed Allergies: Hydrocodone-acetaminophen and Gabapentin   Assessment / Plan:     Visit Diagnoses: No diagnosis found.  Orders: No orders of the defined types were placed in this encounter.  No orders of the defined types were placed in this encounter.   Face-to-face time spent with patient was *** minutes. Greater than 50% of time was spent in counseling and coordination of care.  Follow-Up Instructions: No follow-ups on file.   Ofilia Neas, PA-C  Note -  This record has been created using Dragon software.  Chart creation errors have been sought, but may not always  have been located. Such creation errors do not reflect on  the standard of medical care.

## 2019-06-11 ENCOUNTER — Telehealth: Payer: Self-pay | Admitting: Rheumatology

## 2019-06-11 DIAGNOSIS — Z79899 Other long term (current) drug therapy: Secondary | ICD-10-CM

## 2019-06-11 NOTE — Telephone Encounter (Signed)
Patient going to [facility] in [place] tomorrow for labs. Please release orders.

## 2019-06-11 NOTE — Telephone Encounter (Signed)
Lab Orders released.  

## 2019-06-12 ENCOUNTER — Ambulatory Visit: Payer: Medicare HMO | Admitting: Rheumatology

## 2019-06-12 DIAGNOSIS — Z79899 Other long term (current) drug therapy: Secondary | ICD-10-CM | POA: Diagnosis not present

## 2019-06-14 LAB — CBC WITH DIFFERENTIAL/PLATELET
Basophils Absolute: 0.1 10*3/uL (ref 0.0–0.2)
Basos: 1 %
EOS (ABSOLUTE): 0.1 10*3/uL (ref 0.0–0.4)
Eos: 2 %
Hematocrit: 40.7 % (ref 34.0–46.6)
Hemoglobin: 13.5 g/dL (ref 11.1–15.9)
Immature Grans (Abs): 0 10*3/uL (ref 0.0–0.1)
Immature Granulocytes: 0 %
Lymphocytes Absolute: 2.2 10*3/uL (ref 0.7–3.1)
Lymphs: 38 %
MCH: 28 pg (ref 26.6–33.0)
MCHC: 33.2 g/dL (ref 31.5–35.7)
MCV: 84 fL (ref 79–97)
Monocytes Absolute: 0.5 10*3/uL (ref 0.1–0.9)
Monocytes: 9 %
Neutrophils Absolute: 2.9 10*3/uL (ref 1.4–7.0)
Neutrophils: 50 %
Platelets: 285 10*3/uL (ref 150–450)
RBC: 4.82 x10E6/uL (ref 3.77–5.28)
RDW: 13 % (ref 11.7–15.4)
WBC: 5.8 10*3/uL (ref 3.4–10.8)

## 2019-06-14 LAB — CMP14+EGFR
ALT: 9 IU/L (ref 0–32)
AST: 21 IU/L (ref 0–40)
Albumin/Globulin Ratio: 2.2 (ref 1.2–2.2)
Albumin: 4.2 g/dL (ref 3.7–4.7)
Alkaline Phosphatase: 101 IU/L (ref 39–117)
BUN/Creatinine Ratio: 19 (ref 12–28)
BUN: 13 mg/dL (ref 8–27)
Bilirubin Total: 0.3 mg/dL (ref 0.0–1.2)
CO2: 26 mmol/L (ref 20–29)
Calcium: 9.3 mg/dL (ref 8.7–10.3)
Chloride: 97 mmol/L (ref 96–106)
Creatinine, Ser: 0.7 mg/dL (ref 0.57–1.00)
GFR calc Af Amer: 100 mL/min/{1.73_m2} (ref >59)
GFR calc non Af Amer: 87 mL/min/{1.73_m2} (ref >59)
Globulin, Total: 1.9 g/dL (ref 1.5–4.5)
Glucose: 87 mg/dL (ref 65–99)
Potassium: 4.2 mmol/L (ref 3.5–5.2)
Sodium: 136 mmol/L (ref 134–144)
Total Protein: 6.1 g/dL (ref 6.0–8.5)

## 2019-06-21 ENCOUNTER — Other Ambulatory Visit: Payer: Self-pay | Admitting: Rheumatology

## 2019-06-21 DIAGNOSIS — M0579 Rheumatoid arthritis with rheumatoid factor of multiple sites without organ or systems involvement: Secondary | ICD-10-CM

## 2019-06-21 NOTE — Telephone Encounter (Addendum)
Too soon to refill.

## 2019-06-25 DIAGNOSIS — M05761 Rheumatoid arthritis with rheumatoid factor of right knee without organ or systems involvement: Secondary | ICD-10-CM | POA: Diagnosis not present

## 2019-06-25 DIAGNOSIS — E785 Hyperlipidemia, unspecified: Secondary | ICD-10-CM | POA: Diagnosis not present

## 2019-06-25 DIAGNOSIS — K589 Irritable bowel syndrome without diarrhea: Secondary | ICD-10-CM | POA: Diagnosis not present

## 2019-06-25 DIAGNOSIS — Z6827 Body mass index (BMI) 27.0-27.9, adult: Secondary | ICD-10-CM | POA: Diagnosis not present

## 2019-06-25 DIAGNOSIS — G20 Parkinson's disease: Secondary | ICD-10-CM | POA: Diagnosis not present

## 2019-07-03 DIAGNOSIS — M2011 Hallux valgus (acquired), right foot: Secondary | ICD-10-CM | POA: Diagnosis not present

## 2019-07-03 DIAGNOSIS — Z9889 Other specified postprocedural states: Secondary | ICD-10-CM | POA: Diagnosis not present

## 2019-07-03 DIAGNOSIS — M2021 Hallux rigidus, right foot: Secondary | ICD-10-CM | POA: Diagnosis not present

## 2019-07-15 NOTE — Progress Notes (Signed)
Office Visit Note  Patient: Dawn Harrell             Date of Birth: 1946/02/18           MRN: [id number]             PCP: Nicoletta Dress, MD Referring: Nicoletta Dress, MD Visit Date: 07/18/2019 Occupation: @GUAROCC @  Subjective:  Pain in left knee.   History of Present Illness: Dawn Harrell is a 74 y.o. female with seropositive rheumatoid arthritis.  She states she has been continues to have pain and discomfort in the left knee joint.  She denies any joint swelling.  She has been taking Plaquenil on a regular basis.  She continues to have discomfort in her hands and feet due to osteoarthritis.  Activities of Daily Living:  Patient reports morning stiffness for 15 minutes.   Patient Reports nocturnal pain.  Difficulty dressing/grooming: Reports Difficulty climbing stairs: Reports Difficulty getting out of chair: Reports Difficulty using hands for taps, buttons, cutlery, and/or writing: Denies  Review of Systems  Constitutional: Negative for fatigue, night sweats, weight gain and weight loss.  HENT: Negative for mouth sores, trouble swallowing, trouble swallowing, mouth dryness and nose dryness.   Eyes: Negative for pain, redness, itching, visual disturbance and dryness.  Respiratory: Negative for cough, shortness of breath and difficulty breathing.   Cardiovascular: Negative for chest pain, palpitations, hypertension, irregular heartbeat and swelling in legs/feet.  Gastrointestinal: Negative for blood in stool, constipation and diarrhea.  Endocrine: Negative for increased urination.  Genitourinary: Negative for difficulty urinating, painful urination and vaginal dryness.  Musculoskeletal: Positive for arthralgias, gait problem, joint pain and morning stiffness. Negative for joint swelling, myalgias, muscle weakness, muscle tenderness and myalgias.  Skin: Negative for color change, rash, hair loss, redness, skin tightness, ulcers and sensitivity to sunlight.    Allergic/Immunologic: Negative for susceptible to infections.  Neurological: Positive for tremors. Negative for dizziness, numbness, headaches, memory loss, night sweats and weakness.  Hematological: Positive for bruising/bleeding tendency. Negative for swollen glands.  Psychiatric/Behavioral: Negative for depressed mood, confusion and sleep disturbance. The patient is not nervous/anxious.     PMFS History:  Patient Active Problem List   Diagnosis Date Noted  . Chondromalacia of both patellae 10/04/2016  . History of tremor/ Parkinsons dx.  10/04/2016  . History of TIA (transient ischemic attack) 10/04/2016  . History of hyperlipidemia 10/04/2016  . Tremor 03/24/2016  . Rheumatoid arthritis involving multiple sites with positive rheumatoid factor ([place]) 03/24/2016  . Primary osteoarthritis of both hands 03/24/2016  . Primary osteoarthritis of both knees 03/24/2016  . Primary osteoarthritis of both feet 03/24/2016  . High risk medication use 03/24/2016    Past Medical History:  Diagnosis Date  . Arthritis   . Parkinson's disease ([place])   . skin cancer     Family History  Problem Relation Age of Onset  . Stroke Mother   . Heart attack Father   . Rheum arthritis Sister   . Lung disease Brother   . Diabetes Maternal Grandmother   . Diabetes Paternal Grandmother   . Leukemia Sister    Past Surgical History:  Procedure Laterality Date  . ABDOMINAL HYSTERECTOMY    . bladder tack    . FOOT SURGERY    . SKIN CANCER EXCISION  11/2015  . SQUAMOUS CELL CARCINOMA EXCISION  03/2018   right arm   Social History   Social History Narrative  . Not on file    There is no immunization  history on file for this patient.   Objective: Vital Signs: BP (!) 148/86 (BP Location: Left Arm, Patient Position: Sitting, Cuff Size: Normal)   Pulse 78   Resp 15   Ht 5\' 1"  (1.549 m)   Wt 148 lb (67.1 kg)   BMI 27.96 kg/m    Physical Exam Vitals and nursing note reviewed.  Constitutional:       Appearance: She is well-developed.  HENT:     Head: Normocephalic and atraumatic.  Eyes:     Conjunctiva/sclera: Conjunctivae normal.  Cardiovascular:     Rate and Rhythm: Normal rate and regular rhythm.     Heart sounds: Normal heart sounds.  Pulmonary:     Effort: Pulmonary effort is normal.     Breath sounds: Normal breath sounds.  Abdominal:     General: Bowel sounds are normal.     Palpations: Abdomen is soft.  Musculoskeletal:     Cervical back: Normal range of motion.  Lymphadenopathy:     Cervical: No cervical adenopathy.  Skin:    General: Skin is warm and dry.     Capillary Refill: Capillary refill takes less than 2 seconds.  Neurological:     Mental Status: She is alert and oriented to person, place, and time.  Psychiatric:        Behavior: Behavior normal.      Musculoskeletal Exam: C-spine with good range of motion.  Shoulder joints elbow joints wrist joints with good range of motion.  She has DIP PIP and CMC thickening consistent with osteoarthritis.  No synovitis was noted.  Hip joints and knee joints with good range of motion.  There was no MTP tenderness.  CDAI Exam: CDAI Score: 1.4  Patient Global: 2 mm; Provider Global: 2 mm Swollen: 0 ; Tender: 1  Joint Exam 07/18/2019      Right  Left  Knee      Tender     Investigation: No additional findings.  Imaging: No results found.  Recent Labs: Lab Results  Component Value Date   WBC 5.8 06/12/2019   HGB 13.5 06/12/2019   PLT 285 06/12/2019   NA 136 06/12/2019   K 4.2 06/12/2019   CL 97 06/12/2019   CO2 26 06/12/2019   GLUCOSE 87 06/12/2019   BUN 13 06/12/2019   CREATININE 0.70 06/12/2019   BILITOT 0.3 06/12/2019   ALKPHOS 101 06/12/2019   AST 21 06/12/2019   ALT 9 06/12/2019   PROT 6.1 06/12/2019   ALBUMIN 4.2 06/12/2019   CALCIUM 9.3 06/12/2019   GFRAA 100 06/12/2019   QFTBGOLDPLUS NEGATIVE 01/23/2018    Speciality Comments: PLQ Eye Exam: 12/25/18 WNL @ [facility] up  in 1 year Immunosupressant Lab Work: TB gold-negative 09/04/13 Hepatitis panel- negative except Hep A IgM antibody 09/04/13  Procedures:  Large Joint Inj: L knee on 07/18/2019 2:23 PM Indications: pain Details: 27 G 1.5 in needle, medial approach  Arthrogram: No  Medications: 40 mg triamcinolone acetonide 40 MG/ML; 1.5 mL lidocaine 1 % Aspirate: 0 mL Outcome: tolerated well, no immediate complications Procedure, treatment alternatives, risks and benefits explained, specific risks discussed. Consent was given by the patient. Immediately prior to procedure a time out was called to verify the correct patient, procedure, equipment, support staff and site/side marked as required. Patient was prepped and draped in the usual sterile fashion.     Allergies: Hydrocodone-acetaminophen and Gabapentin   Assessment / Plan:     Visit Diagnoses: Rheumatoid arthritis involving multiple sites  with positive rheumatoid factor ([place]) -patient has been doing well.  She had no synovitis on examination today.  Plan: hydroxychloroquine (PLAQUENIL) 200 MG tablet  High risk medication use - Plaquenil 200 mg twice daily Monday through Friday only. PLQ Eye Exam: 12/25/18.  Her labs have been stable.  Next labs will be in July.  Primary osteoarthritis of both hands-joint protection muscle strengthening was discussed.  Pain in left knee-she has been having increased pain and discomfort in her left knee joint.  No warmth swelling effusion was noted.  She has mild osteoarthritis.  I offered repeat x-rays but she declined.  Patient requested a cortisone injection as she is traveling to [place].  Indications side effects contraindications were discussed.  Per her request left knee joint was injected with cortisone.  She tolerated the procedure well.  Primary osteoarthritis of both knees - x-rays of the right knee joint on 09/19/18 that revealed mild OA and severe chondromalacia patella.  Chondromalacia of both patellae-she  has chronic discomfort.  Primary osteoarthritis of both feet-proper fitting shoes were discussed.  History of hyperlipidemia  History of tremor/ Parkinsons dx.   History of TIA (transient ischemic attack)  Orders: Orders Placed This Encounter  Procedures  . Large Joint Inj   Meds ordered this encounter  Medications  . hydroxychloroquine (PLAQUENIL) 200 MG tablet    Sig: TAKE 1 TABLET TWICE DAILY MONDAY THROUGH FRIDAY    Dispense:  120 tablet    Refill:  0    Rheumatoid arthritis involving multiple sites with positive rheumatoid factor    Face-to-face time spent with patient was 30 minutes. Greater than 50% of time was spent in counseling and coordination of care.  Follow-Up Instructions: Return in about 5 months (around 12/18/2019) for Rheumatoid arthritis.   Bo Merino, MD  Note - This record has been created using Editor, commissioning.  Chart creation errors have been sought, but may not always  have been located. Such creation errors do not reflect on  the standard of medical care.

## 2019-07-18 ENCOUNTER — Other Ambulatory Visit: Payer: Self-pay

## 2019-07-18 ENCOUNTER — Ambulatory Visit: Payer: Medicare HMO | Admitting: Rheumatology

## 2019-07-18 ENCOUNTER — Encounter: Payer: Self-pay | Admitting: Rheumatology

## 2019-07-18 VITALS — BP 148/86 | HR 78 | Resp 15 | Ht 61.0 in | Wt 148.0 lb

## 2019-07-18 DIAGNOSIS — Z8639 Personal history of other endocrine, nutritional and metabolic disease: Secondary | ICD-10-CM | POA: Diagnosis not present

## 2019-07-18 DIAGNOSIS — M0579 Rheumatoid arthritis with rheumatoid factor of multiple sites without organ or systems involvement: Secondary | ICD-10-CM

## 2019-07-18 DIAGNOSIS — M2241 Chondromalacia patellae, right knee: Secondary | ICD-10-CM

## 2019-07-18 DIAGNOSIS — M2242 Chondromalacia patellae, left knee: Secondary | ICD-10-CM

## 2019-07-18 DIAGNOSIS — Z8673 Personal history of transient ischemic attack (TIA), and cerebral infarction without residual deficits: Secondary | ICD-10-CM | POA: Diagnosis not present

## 2019-07-18 DIAGNOSIS — M19041 Primary osteoarthritis, right hand: Secondary | ICD-10-CM | POA: Diagnosis not present

## 2019-07-18 DIAGNOSIS — M19071 Primary osteoarthritis, right ankle and foot: Secondary | ICD-10-CM

## 2019-07-18 DIAGNOSIS — M170 Bilateral primary osteoarthritis of knee: Secondary | ICD-10-CM | POA: Diagnosis not present

## 2019-07-18 DIAGNOSIS — Z79899 Other long term (current) drug therapy: Secondary | ICD-10-CM | POA: Diagnosis not present

## 2019-07-18 DIAGNOSIS — G8929 Other chronic pain: Secondary | ICD-10-CM | POA: Diagnosis not present

## 2019-07-18 DIAGNOSIS — M19042 Primary osteoarthritis, left hand: Secondary | ICD-10-CM

## 2019-07-18 DIAGNOSIS — Z8669 Personal history of other diseases of the nervous system and sense organs: Secondary | ICD-10-CM

## 2019-07-18 DIAGNOSIS — M25562 Pain in left knee: Secondary | ICD-10-CM

## 2019-07-18 DIAGNOSIS — M19072 Primary osteoarthritis, left ankle and foot: Secondary | ICD-10-CM

## 2019-07-18 MED ORDER — LIDOCAINE HCL 1 % IJ SOLN
1.5000 mL | INTRAMUSCULAR | Status: AC | PRN
  Administered 2019-07-18: 1.5 mL

## 2019-07-18 MED ORDER — TRIAMCINOLONE ACETONIDE 40 MG/ML IJ SUSP
40.0000 mg | INTRAMUSCULAR | Status: AC | PRN
  Administered 2019-07-18: 40 mg via INTRA_ARTICULAR

## 2019-07-18 MED ORDER — HYDROXYCHLOROQUINE SULFATE 200 MG PO TABS: ORAL_TABLET | ORAL | 0 refills | Status: DC

## 2019-07-18 NOTE — Patient Instructions (Signed)
Standing Labs We placed an order today for your standing lab work.    Please come back and get your standing labs in July  We have open lab daily Monday through Thursday from 8:30-12:30 PM and 1:30-4:30 PM and Friday from 8:30-12:30 PM and 1:30-4:00 PM at the office of Dr. Bo Merino.   You may experience shorter wait times on Monday and Friday afternoons. The office is located at [address], [place], [place], [place] [address] No appointment is necessary.   Labs are drawn by [facility].  You may receive a bill from [facility] for your lab work.  If you wish to have your labs drawn at another location, please call the office 24 hours in advance to send orders.  If you have any questions regarding directions or hours of operation,  please call [phone].   Just as a reminder please drink plenty of water prior to coming for your lab work. Thanks!

## 2019-08-01 DIAGNOSIS — G20 Parkinson's disease: Secondary | ICD-10-CM | POA: Diagnosis not present

## 2019-08-01 DIAGNOSIS — W19XXXS Unspecified fall, sequela: Secondary | ICD-10-CM | POA: Diagnosis not present

## 2019-08-19 ENCOUNTER — Other Ambulatory Visit: Payer: Self-pay | Admitting: Rheumatology

## 2019-08-26 ENCOUNTER — Other Ambulatory Visit: Payer: Self-pay | Admitting: Rheumatology

## 2019-08-26 DIAGNOSIS — M0579 Rheumatoid arthritis with rheumatoid factor of multiple sites without organ or systems involvement: Secondary | ICD-10-CM

## 2019-09-27 DIAGNOSIS — H40013 Open angle with borderline findings, low risk, bilateral: Secondary | ICD-10-CM | POA: Diagnosis not present

## 2019-09-27 DIAGNOSIS — H47233 Glaucomatous optic atrophy, bilateral: Secondary | ICD-10-CM | POA: Diagnosis not present

## 2019-10-23 ENCOUNTER — Other Ambulatory Visit: Payer: Self-pay | Admitting: *Deleted

## 2019-10-23 DIAGNOSIS — M0579 Rheumatoid arthritis with rheumatoid factor of multiple sites without organ or systems involvement: Secondary | ICD-10-CM

## 2019-10-23 MED ORDER — HYDROXYCHLOROQUINE SULFATE 200 MG PO TABS: ORAL_TABLET | ORAL | 0 refills | Status: DC

## 2019-10-23 NOTE — Telephone Encounter (Signed)
Last Visit: 07/18/2019 Next Visit: 12/19/2019 Labs: 06/12/2019 WNL PLQ Eye Exam: 12/25/18 WNL  Current Dose per office note 07/18/2019: Plaquenil 200 mg twice daily Monday through Friday only DX: Rheumatoid arthritis   Okay to refill Plaquenil?

## 2019-10-28 ENCOUNTER — Telehealth: Payer: Self-pay | Admitting: Rheumatology

## 2019-10-28 DIAGNOSIS — Z79899 Other long term (current) drug therapy: Secondary | ICD-10-CM

## 2019-10-28 NOTE — Telephone Encounter (Signed)
Patient called requesting labwork orders be sent to [facility] in [place].  Patient states she will be going on Wednesday, 10/30/19.

## 2019-10-28 NOTE — Telephone Encounter (Signed)
Lab orders released for [facility]. Patient is aware.

## 2019-10-28 NOTE — Telephone Encounter (Signed)
Patient called back stating the labwork orders needs to be sent to [facility] in [place].

## 2019-10-30 DIAGNOSIS — Z79899 Other long term (current) drug therapy: Secondary | ICD-10-CM | POA: Diagnosis not present

## 2019-10-31 LAB — CBC WITH DIFFERENTIAL/PLATELET
Basophils Absolute: 0.1 10*3/uL (ref 0.0–0.2)
Basos: 1 %
EOS (ABSOLUTE): 0.1 10*3/uL (ref 0.0–0.4)
Eos: 2 %
Hematocrit: 37.9 % (ref 34.0–46.6)
Hemoglobin: 13.3 g/dL (ref 11.1–15.9)
Immature Grans (Abs): 0 10*3/uL (ref 0.0–0.1)
Immature Granulocytes: 0 %
Lymphocytes Absolute: 2 10*3/uL (ref 0.7–3.1)
Lymphs: 33 %
MCH: 28.3 pg (ref 26.6–33.0)
MCHC: 35.1 g/dL (ref 31.5–35.7)
MCV: 81 fL (ref 79–97)
Monocytes Absolute: 0.5 10*3/uL (ref 0.1–0.9)
Monocytes: 8 %
Neutrophils Absolute: 3.3 10*3/uL (ref 1.4–7.0)
Neutrophils: 56 %
Platelets: 260 10*3/uL (ref 150–450)
RBC: 4.7 x10E6/uL (ref 3.77–5.28)
RDW: 12.8 % (ref 11.7–15.4)
WBC: 6 10*3/uL (ref 3.4–10.8)

## 2019-10-31 LAB — CMP14+EGFR
ALT: 6 IU/L (ref 0–32)
AST: 18 IU/L (ref 0–40)
Albumin/Globulin Ratio: 2.3 — ABNORMAL HIGH (ref 1.2–2.2)
Albumin: 4.4 g/dL (ref 3.7–4.7)
Alkaline Phosphatase: 106 IU/L (ref 48–121)
BUN/Creatinine Ratio: 16 (ref 12–28)
BUN: 14 mg/dL (ref 8–27)
Bilirubin Total: 0.4 mg/dL (ref 0.0–1.2)
CO2: 25 mmol/L (ref 20–29)
Calcium: 9.9 mg/dL (ref 8.7–10.3)
Chloride: 97 mmol/L (ref 96–106)
Creatinine, Ser: 0.88 mg/dL (ref 0.57–1.00)
GFR calc Af Amer: 75 mL/min/{1.73_m2} (ref >59)
GFR calc non Af Amer: 65 mL/min/{1.73_m2} (ref >59)
Globulin, Total: 1.9 g/dL (ref 1.5–4.5)
Glucose: 94 mg/dL (ref 65–99)
Potassium: 4.6 mmol/L (ref 3.5–5.2)
Sodium: 135 mmol/L (ref 134–144)
Total Protein: 6.3 g/dL (ref 6.0–8.5)

## 2019-11-05 DIAGNOSIS — N390 Urinary tract infection, site not specified: Secondary | ICD-10-CM | POA: Diagnosis not present

## 2019-11-18 DIAGNOSIS — Z6828 Body mass index (BMI) 28.0-28.9, adult: Secondary | ICD-10-CM | POA: Diagnosis not present

## 2019-11-18 DIAGNOSIS — R1031 Right lower quadrant pain: Secondary | ICD-10-CM | POA: Diagnosis not present

## 2019-11-27 DIAGNOSIS — L570 Actinic keratosis: Secondary | ICD-10-CM | POA: Diagnosis not present

## 2019-11-27 DIAGNOSIS — L579 Skin changes due to chronic exposure to nonionizing radiation, unspecified: Secondary | ICD-10-CM | POA: Diagnosis not present

## 2019-11-27 DIAGNOSIS — Z85828 Personal history of other malignant neoplasm of skin: Secondary | ICD-10-CM | POA: Diagnosis not present

## 2019-12-03 DIAGNOSIS — I700 Atherosclerosis of aorta: Secondary | ICD-10-CM | POA: Diagnosis not present

## 2019-12-03 DIAGNOSIS — N83291 Other ovarian cyst, right side: Secondary | ICD-10-CM | POA: Diagnosis not present

## 2019-12-03 DIAGNOSIS — N83201 Unspecified ovarian cyst, right side: Secondary | ICD-10-CM | POA: Diagnosis not present

## 2019-12-03 DIAGNOSIS — D1809 Hemangioma of other sites: Secondary | ICD-10-CM | POA: Diagnosis not present

## 2019-12-03 DIAGNOSIS — K449 Diaphragmatic hernia without obstruction or gangrene: Secondary | ICD-10-CM | POA: Diagnosis not present

## 2019-12-04 DIAGNOSIS — K449 Diaphragmatic hernia without obstruction or gangrene: Secondary | ICD-10-CM | POA: Diagnosis not present

## 2019-12-04 DIAGNOSIS — I700 Atherosclerosis of aorta: Secondary | ICD-10-CM | POA: Diagnosis not present

## 2019-12-04 DIAGNOSIS — N83291 Other ovarian cyst, right side: Secondary | ICD-10-CM | POA: Diagnosis not present

## 2019-12-04 DIAGNOSIS — D1809 Hemangioma of other sites: Secondary | ICD-10-CM | POA: Diagnosis not present

## 2019-12-06 NOTE — Progress Notes (Signed)
Office Visit Note  Patient: Dawn Harrell             Date of Birth: 06-Apr-1946           MRN: [id number]             PCP: Nicoletta Dress, MD Referring: Nicoletta Dress, MD Visit Date: 12/19/2019 Occupation: @GUAROCC @  Subjective:  Pain in both knees.   History of Present Illness: Dawn Harrell is a 74 y.o. female with history of rheumatoid arthritis.  She states she continues to have pain and discomfort in her joints.  Her knee joints have been painful.  She has been taking Plaquenil on a regular basis.  Activities of Daily Living:  Patient reports morning stiffness for 15 minutes.   Patient Reports nocturnal pain.  Difficulty dressing/grooming: Denies Difficulty climbing stairs: Reports Difficulty getting out of chair: Reports Difficulty using hands for taps, buttons, cutlery, and/or writing: Reports  Review of Systems  Constitutional: Positive for fatigue.  HENT: Negative for mouth sores, mouth dryness and nose dryness.   Eyes: Negative for itching and dryness.  Respiratory: Negative for shortness of breath and difficulty breathing.   Cardiovascular: Negative for chest pain and palpitations.  Gastrointestinal: Negative for blood in stool, constipation and diarrhea.  Endocrine: Negative for increased urination.  Genitourinary: Negative for difficulty urinating.  Musculoskeletal: Positive for arthralgias, joint pain, joint swelling, myalgias, morning stiffness, muscle tenderness and myalgias.  Skin: Negative for color change, rash and redness.  Allergic/Immunologic: Negative for susceptible to infections.  Neurological: Positive for memory loss. Negative for dizziness, numbness, headaches and weakness.  Hematological: Positive for bruising/bleeding tendency.  Psychiatric/Behavioral: Negative for confusion. The patient is nervous/anxious.     PMFS History:  Patient Active Problem List   Diagnosis Date Noted   Chondromalacia of both patellae 10/04/2016    History of tremor/ Parkinsons dx.  10/04/2016   History of TIA (transient ischemic attack) 10/04/2016   History of hyperlipidemia 10/04/2016   Tremor 03/24/2016   Rheumatoid arthritis involving multiple sites with positive rheumatoid factor ([place]) 03/24/2016   Primary osteoarthritis of both hands 03/24/2016   Primary osteoarthritis of both knees 03/24/2016   Primary osteoarthritis of both feet 03/24/2016   High risk medication use 03/24/2016    Past Medical History:  Diagnosis Date   Arthritis    Parkinson's disease ([place])    skin cancer     Family History  Problem Relation Age of Onset   Stroke Mother    Heart attack Father    Rheum arthritis Sister    Lung disease Brother    Diabetes Maternal Grandmother    Diabetes Paternal Grandmother    Leukemia Sister    Past Surgical History:  Procedure Laterality Date   ABDOMINAL HYSTERECTOMY     bladder tack     FOOT SURGERY     SKIN CANCER EXCISION  11/2015   SQUAMOUS CELL CARCINOMA EXCISION  03/2018   right arm   Social History   Social History Narrative   Not on file   Immunization History  Administered Date(s) Administered   Moderna SARS-COVID-2 Vaccination 05/23/2019, 06/20/2019     Objective: Vital Signs: BP (!) 153/84 (BP Location: Left Arm, Patient Position: Sitting, Cuff Size: Normal)    Pulse 71    Resp 14    Ht 5\' 1"  (1.549 m)    Wt 151 lb (68.5 kg)    BMI 28.53 kg/m    Physical Exam Vitals and nursing note reviewed. Chaperone present:  Patient is wheelchair-bound.  Constitutional:      Appearance: She is well-developed.  HENT:     Head: Normocephalic and atraumatic.  Eyes:     Conjunctiva/sclera: Conjunctivae normal.  Cardiovascular:     Rate and Rhythm: Normal rate and regular rhythm.     Heart sounds: Normal heart sounds.  Pulmonary:     Effort: Pulmonary effort is normal.     Breath sounds: Normal breath sounds.  Abdominal:     General: Bowel sounds are normal.      Palpations: Abdomen is soft.  Musculoskeletal:     Cervical back: Normal range of motion.  Lymphadenopathy:     Cervical: No cervical adenopathy.  Skin:    General: Skin is warm and dry.     Capillary Refill: Capillary refill takes less than 2 seconds.  Neurological:     Mental Status: She is alert and oriented to person, place, and time.  Psychiatric:        Behavior: Behavior normal.      Musculoskeletal Exam: Shoulder joints, elbow joints, wrist joints with good range of motion.  She has bilateral PIP DIP and CMC thickening with no synovitis.  Hip joints were difficult to assess in the sitting position.  Knee joints with good range of motion.  She had pain and discomfort range of motion of her left knee joint.  No MTP tenderness  was noted.  CDAI Exam: CDAI Score: 1.8  Patient Global: 4 mm; Provider Global: 4 mm Swollen: 0 ; Tender: 1  Joint Exam 12/19/2019      Right  Left  Knee      Tender   There is currently no information documented on the homunculus. Go to the Rheumatology activity and complete the homunculus joint exam.  Investigation: No additional findings.  Imaging: XR Foot 2 Views Left  Result Date: 12/19/2019 First MTP, PIP and DIP joint space narrowing.  No erosive changes were noted. Inferior and posterior calcaneal spurs.  No interval change was noted when compared to the films of 2018. Impression: These findings are consistent with osteoarthritis   XR Foot 2 Views Right  Result Date: 12/19/2019 First MTP, PIP and DIP joint space narrowing.  No erosive changes were noted. Inferior and posterior calcaneal spurs.  No interval change was noted when compared to the films of 2018. Impression: These findings are consistent with osteoarthritis   XR Hand 2 View Left  Result Date: 12/19/2019 Juxta-articular osteopenia. Second and third MCP narrowing. All PIP and DIP narrowing.  Second DIP subluxation was noted. No intercarpal or radiocarpal joint space narrowing.   No interval change was noted when compared to the x-rays of 2018. Impression: These findings are consistent with rheumatoid arthritis and osteoarthritis overlap   XR Hand 2 View Right  Result Date: 12/19/2019 Juxta-articular osteopenia. Second and third MCP narrowing. All PIP and DIP narrowing. No intercarpal or radiocarpal joint space narrowing. No erosive changes.  No interval change was noted when compared to the x-rays of November 2018. Impression: These findings are consistent with rheumatoid arthritis and osteoarthritis overlap.    Recent Labs: Lab Results  Component Value Date   WBC 6.0 10/30/2019   HGB 13.3 10/30/2019   PLT 260 10/30/2019   NA 135 10/30/2019   K 4.6 10/30/2019   CL 97 10/30/2019   CO2 25 10/30/2019   GLUCOSE 94 10/30/2019   BUN 14 10/30/2019   CREATININE 0.88 10/30/2019   BILITOT 0.4 10/30/2019   ALKPHOS 106 10/30/2019  AST 18 10/30/2019   ALT 6 10/30/2019   PROT 6.3 10/30/2019   ALBUMIN 4.4 10/30/2019   CALCIUM 9.9 10/30/2019   GFRAA 75 10/30/2019   QFTBGOLDPLUS NEGATIVE 01/23/2018    Speciality Comments: PLQ Eye Exam: 12/25/18 WNL @ [facility] up in 1 year Immunosupressant Lab Work: TB gold-negative 09/04/13 Hepatitis panel- negative except Hep A IgM antibody 09/04/13  Procedures:  Large Joint Inj: L knee on 12/19/2019 3:14 PM Indications: pain Details: 27 G 1.5 in needle, medial approach  Arthrogram: No  Medications: 40 mg triamcinolone acetonide 40 MG/ML; 1.5 mL lidocaine 1 % Aspirate: 0 mL Outcome: tolerated well, no immediate complications Procedure, treatment alternatives, risks and benefits explained, specific risks discussed. Consent was given by the patient. Immediately prior to procedure a time out was called to verify the correct patient, procedure, equipment, support staff and site/side marked as required. Patient was prepped and draped in the usual sterile fashion.     Allergies: Hydrocodone-acetaminophen and  Gabapentin   Assessment / Plan:     Visit Diagnoses: Rheumatoid arthritis involving multiple sites with positive rheumatoid factor ([place]) -patient had no synovitis in her hands or feet.  Plan: XR Hand 2 View Right, XR Hand 2 View Left, XR Foot 2 Views Right, XR Foot 2 Views Left.  X-rays obtained today showed no radiographic progression.  High risk medication use - Plaquenil 200 mg twice daily Monday through Friday only. PLQ Eye Exam: 01/28/20.  Her labs are stable.  Primary osteoarthritis of both hands-joint protection was discussed.  Primary osteoarthritis of both knees - x-rays of the right knee joint on 09/19/18 that revealed mild OA and severe chondromalacia patella.  Left knee pain-she has severe arthritis.  She has been experiencing increased pain.  According to patient's request left knee joint was injected with cortisone as described above.  Chondromalacia of both patellae  Primary osteoarthritis of both feet-proper fitting shoes were discussed.  History of hyperlipidemia  History of TIA (transient ischemic attack)  History of tremor/ Parkinsons dx.   Osteoporosis screening-patient does not know about the DEXA scanning status.  I have advised her to discuss this further with her PCP.  Precautions regarding COVID-19 were discussed.  Instructions were placed in the AVS.  Orders: Orders Placed This Encounter  Procedures   Large Joint Inj: L knee   XR Hand 2 View Right   XR Hand 2 View Left   XR Foot 2 Views Right   XR Foot 2 Views Left   No orders of the defined types were placed in this encounter.     Follow-Up Instructions: Return in about 5 months (around 05/20/2020) for Rheumatoid arthritis, Osteoarthritis.   Bo Merino, MD  Note - This record has been created using Editor, commissioning.  Chart creation errors have been sought, but may not always  have been located. Such creation errors do not reflect on  the standard of medical care.

## 2019-12-16 ENCOUNTER — Other Ambulatory Visit: Payer: Self-pay | Admitting: Rheumatology

## 2019-12-16 DIAGNOSIS — M0579 Rheumatoid arthritis with rheumatoid factor of multiple sites without organ or systems involvement: Secondary | ICD-10-CM

## 2019-12-18 DIAGNOSIS — M47816 Spondylosis without myelopathy or radiculopathy, lumbar region: Secondary | ICD-10-CM | POA: Diagnosis not present

## 2019-12-18 DIAGNOSIS — Z1331 Encounter for screening for depression: Secondary | ICD-10-CM | POA: Diagnosis not present

## 2019-12-18 DIAGNOSIS — M549 Dorsalgia, unspecified: Secondary | ICD-10-CM | POA: Diagnosis not present

## 2019-12-18 DIAGNOSIS — N838 Other noninflammatory disorders of ovary, fallopian tube and broad ligament: Secondary | ICD-10-CM | POA: Diagnosis not present

## 2019-12-18 DIAGNOSIS — Z139 Encounter for screening, unspecified: Secondary | ICD-10-CM | POA: Diagnosis not present

## 2019-12-18 DIAGNOSIS — Z9181 History of falling: Secondary | ICD-10-CM | POA: Diagnosis not present

## 2019-12-18 DIAGNOSIS — G20 Parkinson's disease: Secondary | ICD-10-CM | POA: Diagnosis not present

## 2019-12-18 DIAGNOSIS — I878 Other specified disorders of veins: Secondary | ICD-10-CM | POA: Diagnosis not present

## 2019-12-18 DIAGNOSIS — E785 Hyperlipidemia, unspecified: Secondary | ICD-10-CM | POA: Diagnosis not present

## 2019-12-18 DIAGNOSIS — F430 Acute stress reaction: Secondary | ICD-10-CM | POA: Diagnosis not present

## 2019-12-18 DIAGNOSIS — E871 Hypo-osmolality and hyponatremia: Secondary | ICD-10-CM | POA: Diagnosis not present

## 2019-12-18 DIAGNOSIS — M5136 Other intervertebral disc degeneration, lumbar region: Secondary | ICD-10-CM | POA: Diagnosis not present

## 2019-12-18 DIAGNOSIS — Z0000 Encounter for general adult medical examination without abnormal findings: Secondary | ICD-10-CM | POA: Diagnosis not present

## 2019-12-19 ENCOUNTER — Ambulatory Visit: Payer: Self-pay

## 2019-12-19 ENCOUNTER — Ambulatory Visit: Payer: Medicare HMO | Admitting: Rheumatology

## 2019-12-19 ENCOUNTER — Other Ambulatory Visit: Payer: Self-pay

## 2019-12-19 ENCOUNTER — Encounter: Payer: Self-pay | Admitting: Rheumatology

## 2019-12-19 VITALS — BP 153/84 | HR 71 | Resp 14 | Ht 61.0 in | Wt 151.0 lb

## 2019-12-19 DIAGNOSIS — Z13820 Encounter for screening for osteoporosis: Secondary | ICD-10-CM

## 2019-12-19 DIAGNOSIS — G8929 Other chronic pain: Secondary | ICD-10-CM

## 2019-12-19 DIAGNOSIS — M19042 Primary osteoarthritis, left hand: Secondary | ICD-10-CM | POA: Diagnosis not present

## 2019-12-19 DIAGNOSIS — Z8669 Personal history of other diseases of the nervous system and sense organs: Secondary | ICD-10-CM | POA: Diagnosis not present

## 2019-12-19 DIAGNOSIS — M19041 Primary osteoarthritis, right hand: Secondary | ICD-10-CM

## 2019-12-19 DIAGNOSIS — M19071 Primary osteoarthritis, right ankle and foot: Secondary | ICD-10-CM

## 2019-12-19 DIAGNOSIS — M170 Bilateral primary osteoarthritis of knee: Secondary | ICD-10-CM

## 2019-12-19 DIAGNOSIS — M2241 Chondromalacia patellae, right knee: Secondary | ICD-10-CM

## 2019-12-19 DIAGNOSIS — Z8639 Personal history of other endocrine, nutritional and metabolic disease: Secondary | ICD-10-CM | POA: Diagnosis not present

## 2019-12-19 DIAGNOSIS — M0579 Rheumatoid arthritis with rheumatoid factor of multiple sites without organ or systems involvement: Secondary | ICD-10-CM

## 2019-12-19 DIAGNOSIS — M25562 Pain in left knee: Secondary | ICD-10-CM | POA: Diagnosis not present

## 2019-12-19 DIAGNOSIS — M19072 Primary osteoarthritis, left ankle and foot: Secondary | ICD-10-CM

## 2019-12-19 DIAGNOSIS — M2242 Chondromalacia patellae, left knee: Secondary | ICD-10-CM

## 2019-12-19 DIAGNOSIS — Z79899 Other long term (current) drug therapy: Secondary | ICD-10-CM | POA: Diagnosis not present

## 2019-12-19 DIAGNOSIS — Z8673 Personal history of transient ischemic attack (TIA), and cerebral infarction without residual deficits: Secondary | ICD-10-CM

## 2019-12-19 MED ORDER — TRIAMCINOLONE ACETONIDE 40 MG/ML IJ SUSP
40.0000 mg | INTRAMUSCULAR | Status: AC | PRN
  Administered 2019-12-19: 40 mg via INTRA_ARTICULAR

## 2019-12-19 MED ORDER — LIDOCAINE HCL 1 % IJ SOLN
1.5000 mL | INTRAMUSCULAR | Status: AC | PRN
  Administered 2019-12-19: 1.5 mL

## 2019-12-19 NOTE — Patient Instructions (Addendum)
COVID-19 vaccine recommendations:   COVID-19 vaccine is recommended for everyone (unless you are allergic to a vaccine component), even if you are on a medication that suppresses your immune system.   If you are on Methotrexate, Cellcept (mycophenolate), Rinvoq, Morrie Sheldon, and Olumiant- hold the medication for 1 week after each vaccine. Hold Methotrexate for 2 weeks after the single dose COVID-19 vaccine.   If you are on Orencia subcutaneous injection - hold medication one week prior to and one week after the first COVID-19 vaccine dose (only).   If you are on Orencia IV infusions- time vaccination administration so that the first COVID-19 vaccination will occur four weeks after the infusion and postpone the subsequent infusion by one week.   If you are on Cyclophosphamide or Rituxan infusions please contact your doctor prior to receiving the COVID-19 vaccine.   Do not take Tylenol or any anti-inflammatory medications (NSAIDs) 24 hours prior to the COVID-19 vaccination.   There is no direct evidence about the efficacy of the COVID-19 vaccine in individuals who are on medications that suppress the immune system.   Even if you are fully vaccinated, and you are on any medications that suppress your immune system, please continue to wear a mask, maintain at least six feet social distance and practice hand hygiene.   If you develop a COVID-19 infection, please contact your PCP or our office to determine if you need antibody infusion.  The booster vaccine is now available for immunocompromised patients. It is advised that if you had Pfizer vaccine you should get [employer] booster.  If you had a Moderna vaccine then you should get a Moderna booster. Johnson and Wynetta Emery does not have a booster vaccine at this time.  Please see the following web sites for updated information.    [email or web address]-Patient-Resources.[email or web address]  [email or web address] Standing Labs We placed an order today for your standing lab work.   Please have your standing labs drawn in December  If possible, please have your labs drawn 2 weeks prior to your appointment so that the provider can discuss your results at your appointment.  We have open lab daily Monday through Thursday from 8:30-12:30 PM and 1:30-4:30 PM and Friday from 8:30-12:30 PM and 1:30-4:00 PM at the office of Dr. Bo Merino, [facility].   Please be advised, patients with office appointments requiring lab work will take precedents over walk-in lab work.  If possible, please come for your lab work on Monday and Friday afternoons, as you may experience shorter wait times. The office is located at [address], [place], [place], [place] [address] No appointment is necessary.   Labs are drawn by [facility]. Please bring your co-pay at the time of your lab draw.  You may receive a bill from [facility] for your lab work.  If you wish to have your labs drawn at another location, please call the office 24 hours in advance to send orders.  If you have any questions regarding directions or hours of operation,  please call [phone].   As a reminder, please drink plenty of water prior to coming for your lab work. Thanks!

## 2019-12-20 DIAGNOSIS — N83291 Other ovarian cyst, right side: Secondary | ICD-10-CM | POA: Diagnosis not present

## 2019-12-20 DIAGNOSIS — N838 Other noninflammatory disorders of ovary, fallopian tube and broad ligament: Secondary | ICD-10-CM | POA: Diagnosis not present

## 2019-12-20 DIAGNOSIS — Z90710 Acquired absence of both cervix and uterus: Secondary | ICD-10-CM | POA: Diagnosis not present

## 2019-12-20 DIAGNOSIS — N83201 Unspecified ovarian cyst, right side: Secondary | ICD-10-CM | POA: Diagnosis not present

## 2019-12-27 DIAGNOSIS — I10 Essential (primary) hypertension: Secondary | ICD-10-CM | POA: Diagnosis not present

## 2019-12-30 ENCOUNTER — Encounter: Payer: Self-pay | Admitting: Rheumatology

## 2019-12-30 DIAGNOSIS — H47233 Glaucomatous optic atrophy, bilateral: Secondary | ICD-10-CM | POA: Diagnosis not present

## 2019-12-30 DIAGNOSIS — H40013 Open angle with borderline findings, low risk, bilateral: Secondary | ICD-10-CM | POA: Diagnosis not present

## 2019-12-30 DIAGNOSIS — H524 Presbyopia: Secondary | ICD-10-CM | POA: Diagnosis not present

## 2019-12-30 DIAGNOSIS — H40009 Preglaucoma, unspecified, unspecified eye: Secondary | ICD-10-CM | POA: Diagnosis not present

## 2019-12-30 DIAGNOSIS — H5203 Hypermetropia, bilateral: Secondary | ICD-10-CM | POA: Diagnosis not present

## 2019-12-30 DIAGNOSIS — H25813 Combined forms of age-related cataract, bilateral: Secondary | ICD-10-CM | POA: Diagnosis not present

## 2019-12-30 DIAGNOSIS — H52223 Regular astigmatism, bilateral: Secondary | ICD-10-CM | POA: Diagnosis not present

## 2020-01-03 DIAGNOSIS — M545 Low back pain: Secondary | ICD-10-CM | POA: Diagnosis not present

## 2020-01-06 DIAGNOSIS — I10 Essential (primary) hypertension: Secondary | ICD-10-CM | POA: Diagnosis not present

## 2020-01-06 DIAGNOSIS — E871 Hypo-osmolality and hyponatremia: Secondary | ICD-10-CM | POA: Diagnosis not present

## 2020-01-06 DIAGNOSIS — R252 Cramp and spasm: Secondary | ICD-10-CM | POA: Diagnosis not present

## 2020-01-06 DIAGNOSIS — N83209 Unspecified ovarian cyst, unspecified side: Secondary | ICD-10-CM | POA: Diagnosis not present

## 2020-01-07 DIAGNOSIS — G20 Parkinson's disease: Secondary | ICD-10-CM | POA: Diagnosis not present

## 2020-01-07 DIAGNOSIS — G8929 Other chronic pain: Secondary | ICD-10-CM | POA: Diagnosis not present

## 2020-01-07 DIAGNOSIS — R252 Cramp and spasm: Secondary | ICD-10-CM | POA: Diagnosis not present

## 2020-01-07 DIAGNOSIS — K219 Gastro-esophageal reflux disease without esophagitis: Secondary | ICD-10-CM | POA: Diagnosis not present

## 2020-01-07 DIAGNOSIS — E871 Hypo-osmolality and hyponatremia: Secondary | ICD-10-CM | POA: Diagnosis not present

## 2020-01-07 DIAGNOSIS — N83201 Unspecified ovarian cyst, right side: Secondary | ICD-10-CM | POA: Diagnosis not present

## 2020-01-07 DIAGNOSIS — M1990 Unspecified osteoarthritis, unspecified site: Secondary | ICD-10-CM | POA: Diagnosis not present

## 2020-01-07 DIAGNOSIS — E876 Hypokalemia: Secondary | ICD-10-CM | POA: Diagnosis not present

## 2020-01-07 DIAGNOSIS — I10 Essential (primary) hypertension: Secondary | ICD-10-CM | POA: Diagnosis not present

## 2020-01-07 DIAGNOSIS — K449 Diaphragmatic hernia without obstruction or gangrene: Secondary | ICD-10-CM | POA: Diagnosis not present

## 2020-01-08 DIAGNOSIS — E871 Hypo-osmolality and hyponatremia: Secondary | ICD-10-CM | POA: Diagnosis not present

## 2020-01-08 DIAGNOSIS — G20 Parkinson's disease: Secondary | ICD-10-CM | POA: Diagnosis not present

## 2020-01-08 DIAGNOSIS — G8929 Other chronic pain: Secondary | ICD-10-CM | POA: Diagnosis not present

## 2020-01-09 DIAGNOSIS — G20 Parkinson's disease: Secondary | ICD-10-CM | POA: Diagnosis not present

## 2020-01-09 DIAGNOSIS — E871 Hypo-osmolality and hyponatremia: Secondary | ICD-10-CM | POA: Diagnosis not present

## 2020-01-09 DIAGNOSIS — G8929 Other chronic pain: Secondary | ICD-10-CM | POA: Diagnosis not present

## 2020-01-15 DIAGNOSIS — I10 Essential (primary) hypertension: Secondary | ICD-10-CM | POA: Diagnosis not present

## 2020-01-15 DIAGNOSIS — Z7689 Persons encountering health services in other specified circumstances: Secondary | ICD-10-CM | POA: Diagnosis not present

## 2020-01-15 DIAGNOSIS — E871 Hypo-osmolality and hyponatremia: Secondary | ICD-10-CM | POA: Diagnosis not present

## 2020-01-15 DIAGNOSIS — E876 Hypokalemia: Secondary | ICD-10-CM | POA: Diagnosis not present

## 2020-01-15 DIAGNOSIS — F419 Anxiety disorder, unspecified: Secondary | ICD-10-CM | POA: Diagnosis not present

## 2020-01-15 DIAGNOSIS — Z79899 Other long term (current) drug therapy: Secondary | ICD-10-CM | POA: Diagnosis not present

## 2020-01-15 DIAGNOSIS — G20 Parkinson's disease: Secondary | ICD-10-CM | POA: Diagnosis not present

## 2020-01-15 DIAGNOSIS — N838 Other noninflammatory disorders of ovary, fallopian tube and broad ligament: Secondary | ICD-10-CM | POA: Diagnosis not present

## 2020-01-15 DIAGNOSIS — Z6826 Body mass index (BMI) 26.0-26.9, adult: Secondary | ICD-10-CM | POA: Diagnosis not present

## 2020-01-21 DIAGNOSIS — M419 Scoliosis, unspecified: Secondary | ICD-10-CM | POA: Diagnosis not present

## 2020-01-21 DIAGNOSIS — M5126 Other intervertebral disc displacement, lumbar region: Secondary | ICD-10-CM | POA: Diagnosis not present

## 2020-01-21 DIAGNOSIS — M48061 Spinal stenosis, lumbar region without neurogenic claudication: Secondary | ICD-10-CM | POA: Diagnosis not present

## 2020-01-21 DIAGNOSIS — M4319 Spondylolisthesis, multiple sites in spine: Secondary | ICD-10-CM | POA: Diagnosis not present

## 2020-01-29 DIAGNOSIS — R748 Abnormal levels of other serum enzymes: Secondary | ICD-10-CM | POA: Diagnosis not present

## 2020-01-29 DIAGNOSIS — M549 Dorsalgia, unspecified: Secondary | ICD-10-CM | POA: Diagnosis not present

## 2020-01-29 DIAGNOSIS — I10 Essential (primary) hypertension: Secondary | ICD-10-CM | POA: Diagnosis not present

## 2020-01-29 DIAGNOSIS — F419 Anxiety disorder, unspecified: Secondary | ICD-10-CM | POA: Diagnosis not present

## 2020-01-29 DIAGNOSIS — Z6826 Body mass index (BMI) 26.0-26.9, adult: Secondary | ICD-10-CM | POA: Diagnosis not present

## 2020-01-29 DIAGNOSIS — E871 Hypo-osmolality and hyponatremia: Secondary | ICD-10-CM | POA: Diagnosis not present

## 2020-02-04 DIAGNOSIS — M545 Low back pain, unspecified: Secondary | ICD-10-CM | POA: Diagnosis not present

## 2020-02-04 DIAGNOSIS — M5136 Other intervertebral disc degeneration, lumbar region: Secondary | ICD-10-CM | POA: Diagnosis not present

## 2020-02-04 DIAGNOSIS — M48061 Spinal stenosis, lumbar region without neurogenic claudication: Secondary | ICD-10-CM | POA: Diagnosis not present

## 2020-02-05 ENCOUNTER — Other Ambulatory Visit: Payer: Self-pay

## 2020-02-05 DIAGNOSIS — M0579 Rheumatoid arthritis with rheumatoid factor of multiple sites without organ or systems involvement: Secondary | ICD-10-CM

## 2020-02-05 MED ORDER — HYDROXYCHLOROQUINE SULFATE 200 MG PO TABS: ORAL_TABLET | ORAL | 0 refills | Status: DC

## 2020-02-05 NOTE — Telephone Encounter (Signed)
Last Visit: 12/19/2019 Next Visit: 05/21/2020 Labs: 10/30/2019 CBC WNL. Albumin/globulin ratio borderline elevated-2.3. rest of CMP WNL.  Eye exam:  12/30/2019 WNL   Current Dose per office note 12/19/2019: Plaquenil 200 mg twice daily Monday through Friday only.  DX: Rheumatoid arthritis involving multiple sites with positive rheumatoid factor   Okay to refill Plaquenil?

## 2020-02-05 NOTE — Telephone Encounter (Signed)
Patient called requesting prescription refill of Hydroxychloroquine to be sent to [facility].   

## 2020-02-11 ENCOUNTER — Telehealth: Payer: Self-pay

## 2020-02-11 NOTE — Telephone Encounter (Signed)
Patient left a voicemail requesting prescription refill of Folic Acid.

## 2020-02-11 NOTE — Telephone Encounter (Signed)
Patient advised she no longer needs Folic Acid as she is not on MTX.

## 2020-02-27 DIAGNOSIS — M47816 Spondylosis without myelopathy or radiculopathy, lumbar region: Secondary | ICD-10-CM | POA: Diagnosis not present

## 2020-03-11 DIAGNOSIS — M47896 Other spondylosis, lumbar region: Secondary | ICD-10-CM | POA: Diagnosis not present

## 2020-03-11 DIAGNOSIS — M48061 Spinal stenosis, lumbar region without neurogenic claudication: Secondary | ICD-10-CM | POA: Diagnosis not present

## 2020-03-11 DIAGNOSIS — M5136 Other intervertebral disc degeneration, lumbar region: Secondary | ICD-10-CM | POA: Diagnosis not present

## 2020-03-27 ENCOUNTER — Other Ambulatory Visit: Payer: Self-pay | Admitting: Physician Assistant

## 2020-03-27 DIAGNOSIS — M0579 Rheumatoid arthritis with rheumatoid factor of multiple sites without organ or systems involvement: Secondary | ICD-10-CM

## 2020-03-30 ENCOUNTER — Telehealth: Payer: Self-pay

## 2020-03-30 NOTE — Telephone Encounter (Signed)
Ok to reapply for hyalgan series.  She is wanting hyalgan for both knees or just the left knee? We will need to update x-rays at the first scheduled appointment.

## 2020-03-30 NOTE — Telephone Encounter (Signed)
Patient called stating her left knee is painful and would like to apply for the Hyalgan injections.  Patient states her last gel injections were in 2019 for both knees.

## 2020-03-30 NOTE — Telephone Encounter (Signed)
Patient states she would like to have visco in bilateral knees. Per Hazel Sams, PA-C okay to apply for Hyalgan in bilateral knees.

## 2020-03-31 NOTE — Telephone Encounter (Signed)
Will apply for Visco Jan. 2022. I LMOM for patient to inform her of this, and advise would need new insurance info if it changes at the first of the year.

## 2020-04-13 DIAGNOSIS — Z1231 Encounter for screening mammogram for malignant neoplasm of breast: Secondary | ICD-10-CM | POA: Diagnosis not present

## 2020-04-22 NOTE — Telephone Encounter (Signed)
Submitted for VOB, and PA with [employer] on 04/23/2019.

## 2020-04-30 NOTE — Telephone Encounter (Signed)
Please call to schedule Visco knee injections.  Authorized for [employer] series Bilateral Knees. Buy and Bill. Deductible does not apply. PA for Orthovisc obtained thru [employer]. Authorization # [id number]  effective: 04/22/2020 - 04/17/2021 Insurance to cover 80% of allowable cost with a $20.00 copay. Once OOP is met insurance covers 100% of allowable cost.

## 2020-05-06 ENCOUNTER — Other Ambulatory Visit: Payer: Self-pay | Admitting: Rheumatology

## 2020-05-06 DIAGNOSIS — M0579 Rheumatoid arthritis with rheumatoid factor of multiple sites without organ or systems involvement: Secondary | ICD-10-CM

## 2020-05-06 MED ORDER — HYDROXYCHLOROQUINE SULFATE 200 MG PO TABS: ORAL_TABLET | ORAL | 0 refills | Status: DC

## 2020-05-06 NOTE — Telephone Encounter (Signed)
Last Visit: 12/19/2019 Next Visit: 05/21/2020 Labs: 10/30/2019 CBC WNL. Albumin/globulin ratio borderline elevated-2.3. rest of CMP WNL.  Eye exam:  12/30/2019 WNL   Current Dose per office note 12/19/2019: Plaquenil 200 mg twice daily Monday through Friday only.  DX: Rheumatoid arthritis involving multiple sites with positive rheumatoid factor   Patient to update labs at her appointment on 05/21/2020  Okay to refill Plaquenil?

## 2020-05-06 NOTE — Telephone Encounter (Signed)
Patient requesting a refill on generic Plaquenil. Patient request it be sent to [facility] so she will get it before she runs out.

## 2020-05-07 NOTE — Progress Notes (Signed)
Office Visit Note  Patient: Dawn Harrell             Date of Birth: 1945/04/20           MRN: [id number]             PCP: Paulina Fusi, MD Referring: Paulina Fusi, MD Visit Date: 05/21/2020 Occupation: @GUAROCC @  Subjective:  Pain in both knees   History of Present Illness: Dawn Harrell is a 75 y.o. female with history of seropositive rheumatoid arthritis and osteoarthritis.  Patient is taking Plaquenil 200 mg 1 tablet by mouth twice daily Monday through Friday. She denies any recent rheumatoid arthritis flares.  She presents today with increased discomfort in both hands and both knee joints.  She denies any joint swelling.  She states she feels unstable on her feet at times and it feels like her legs lock up.  She denies any recent falls.  She has been using a walker to assist with ambulation.  She states she noticed significant improvement in her knee joint pain and stiffness after undergoing visco gel injections in the past.  She is scheduled for a repeat series next week.        Activities of Daily Living:  Patient reports morning stiffness for all day. Patient Denies nocturnal pain.  Difficulty dressing/grooming: Reports Difficulty climbing stairs: Reports Difficulty getting out of chair: Reports Difficulty using hands for taps, buttons, cutlery, and/or writing: Reports  Review of Systems  Constitutional: Positive for fatigue.  HENT: Negative for mouth sores, mouth dryness and nose dryness.   Eyes: Negative for pain, visual disturbance and dryness.  Respiratory: Negative for cough, hemoptysis, shortness of breath and difficulty breathing.   Cardiovascular: Negative for chest pain, palpitations, hypertension and swelling in legs/feet.  Gastrointestinal: Negative for blood in stool, constipation and diarrhea.  Endocrine: Negative for increased urination.  Genitourinary: Negative for painful urination.  Musculoskeletal: Positive for arthralgias, joint pain and  morning stiffness. Negative for joint swelling, myalgias, muscle weakness, muscle tenderness and myalgias.  Skin: Negative for color change, pallor, rash, hair loss, nodules/bumps, skin tightness, ulcers and sensitivity to sunlight.  Allergic/Immunologic: Negative for susceptible to infections.  Neurological: Positive for numbness. Negative for dizziness, headaches and weakness.  Hematological: Negative for anemia and swollen glands.  Psychiatric/Behavioral: Negative for depressed mood and sleep disturbance. The patient is not nervous/anxious.     PMFS History:  Patient Active Problem List   Diagnosis Date Noted  . Chondromalacia of both patellae 10/04/2016  . History of tremor/ Parkinsons dx.  10/04/2016  . History of TIA (transient ischemic attack) 10/04/2016  . History of hyperlipidemia 10/04/2016  . Tremor 03/24/2016  . Rheumatoid arthritis involving multiple sites with positive rheumatoid factor (HCC) 03/24/2016  . Primary osteoarthritis of both hands 03/24/2016  . Primary osteoarthritis of both knees 03/24/2016  . Primary osteoarthritis of both feet 03/24/2016  . High risk medication use 03/24/2016    Past Medical History:  Diagnosis Date  . Arthritis   . Parkinson's disease (HCC)   . skin cancer     Family History  Problem Relation Age of Onset  . Stroke Mother   . Heart attack Father   . Rheum arthritis Sister   . Lung disease Brother   . Diabetes Maternal Grandmother   . Diabetes Paternal Grandmother   . Leukemia Sister    Past Surgical History:  Procedure Laterality Date  . ABDOMINAL HYSTERECTOMY    . bladder tack    . FOOT  SURGERY    . SKIN CANCER EXCISION  11/2015  . SQUAMOUS CELL CARCINOMA EXCISION  03/2018   right arm   Social History   Social History Narrative  . Not on file   Immunization History  Administered Date(s) Administered  . Moderna Sars-Covid-2 Vaccination 05/23/2019, 06/20/2019     Objective: Vital Signs: BP (!) 152/94 (BP Location:  Left Arm, Patient Position: Sitting, Cuff Size: Normal)   Pulse 72   Resp 13   Ht 5\' 1"  (1.549 m)   Wt 152 lb 3.2 oz (69 kg)   BMI 28.76 kg/m    Physical Exam Vitals and nursing note reviewed.  Constitutional:      Appearance: She is well-developed and well-nourished.  HENT:     Head: Normocephalic and atraumatic.  Eyes:     Extraocular Movements: EOM normal.     Conjunctiva/sclera: Conjunctivae normal.  Cardiovascular:     Pulses: Intact distal pulses.  Pulmonary:     Effort: Pulmonary effort is normal.  Abdominal:     Palpations: Abdomen is soft.  Musculoskeletal:     Cervical back: Normal range of motion.  Skin:    General: Skin is warm and dry.     Capillary Refill: Capillary refill takes less than 2 seconds.  Neurological:     Mental Status: She is alert and oriented to person, place, and time.  Psychiatric:        Mood and Affect: Mood and affect normal.        Behavior: Behavior normal.      Musculoskeletal Exam: C-spine good good ROM.  Thoracic kyphosis noted.  No midline spinal tenderness.  No SI joint tenderness.  Shoulder joints, elbow joints, wrist joints, MCPs, PIPs, and DIPs good ROM with no synovitis.  PIP and DIP thickening consistent with osteoarthritis of both hands.  CMC joint thickening noted bilaterally.  Hip joints difficult to assess in seated position.  Knee joints painful ROM with crepitus bilaterally.  Warmth of the right knee noted.  Ankle joints good ROM with no tenderness or inflammation.   CDAI Exam: CDAI Score: 0.6  Patient Global: 3 mm; Provider Global: 3 mm Swollen: 0 ; Tender: 0  Joint Exam 05/21/2020   No joint exam has been documented for this visit   There is currently no information documented on the homunculus. Go to the Rheumatology activity and complete the homunculus joint exam.  Investigation: No additional findings.  Imaging: No results found.  Recent Labs: Lab Results  Component Value Date   WBC 6.0 10/30/2019    HGB 13.3 10/30/2019   PLT 260 10/30/2019   NA 135 10/30/2019   K 4.6 10/30/2019   CL 97 10/30/2019   CO2 25 10/30/2019   GLUCOSE 94 10/30/2019   BUN 14 10/30/2019   CREATININE 0.88 10/30/2019   BILITOT 0.4 10/30/2019   ALKPHOS 106 10/30/2019   AST 18 10/30/2019   ALT 6 10/30/2019   PROT 6.3 10/30/2019   ALBUMIN 4.4 10/30/2019   CALCIUM 9.9 10/30/2019   GFRAA 75 10/30/2019   QFTBGOLDPLUS NEGATIVE 01/23/2018    Speciality Comments: PLQ Eye Exam: 12/30/2019 WNL @ [place] up in 1 year  Immunosupressant Lab Work:  TB gold-negative 09/04/13  Hepatitis panel- negative except Hep A IgM antibody 09/04/13  Procedures:  No procedures performed Allergies: Hydrocodone-acetaminophen and Gabapentin   Assessment / Plan:     Visit Diagnoses: Rheumatoid arthritis involving multiple sites with positive rheumatoid factor ([place]) - She has no synovitis  on exam.  She has not had any recent rheumatoid arthritis flares. X-rays of both hands and both feet were obtained on 12/19/2019 and did not show any radiographic progression since 2018.  Overall she has clinically been doing well taking Plaquenil 200 mg 1 tablet by mouth twice daily Monday through Friday.  She presents today with increased pain in both hands and both knee joints but has no inflammation on exam.  She has PIP and DIP thickening consistent with osteoarthritis of both hands which is likely contributing to her discomfort.  We discussed the importance of joint protection and muscle strengthening.  She was given a prescription for the arthritis compression gloves.  Both knee joints have painful range of motion with crepitus and warmth but no effusion was noted.  She is scheduled to receive Visco gel injections starting next week.  X-rays of both knee joints were updated today.  She will continue taking Plaquenil as prescribed.  She was advised to notify us if she develops increased joint pain or joint swelling.  She will return in 5 months  for a routine follow up visit.   High risk medication use - Plaquenil 200 mg 1 tablet by mouth twice daily Monday through Friday only.  CBC and CMP were drawn on 10/30/2019.  She is overdue to update lab work.  Orders for CBC and CMP were released.  PLQ Eye Exam: 12/30/2019 WNL @ [place] up in 1 year.  - Plan: CBC with Differential/Platelet, COMPLETE METABOLIC PANEL WITH GFR  Primary osteoarthritis of both hands: She has PIP and DIP thickening consistent with osteoarthritis of both hands.  CMC joint thickening noted bilaterally.  Complete fist formation bilaterally.  Joint protection and muscle strengthening were discussed. She was given a handout of hand exercises to perform. She was also given a prescription for arthritis compression gloves.   Primary osteoarthritis of both knees - She has chronic pain in both knee joints.  She has painful range of motion of both knees with some warmth and crepitus bilaterally.  No effusion was noted.  She has been using a walker to assist with ambulation.  She has not had any recent falls.  According to the patient at times she feels as though her legs lock up but has not noticed any increased muscle weakness.  She was given a handout of knee joint exercises to perform.  She is scheduled to receive Visco gel injections starting next week.  X-rays of both knee joints were updated today.    Chondromalacia of both patellae: She has bilateral knee crepitus.    Primary osteoarthritis of both feet: Chronic pain.  She has good ROM of both ankle joints with no tenderness or inflammation. She is wearing proper fitting shoes.   Other medical conditions are listed as follows:   History of hyperlipidemia  History of tremor/ Parkinsons dx.   History of TIA (transient ischemic attack)  Osteoporosis screening - No DEXA in epic.  Patient reports DEXA ordered by PCP but is unsure of last DEXA.   Orders: Orders Placed This Encounter  Procedures  . XR KNEE 3  VIEW RIGHT  . XR KNEE 3 VIEW LEFT  . CBC with Differential/Platelet  . COMPLETE METABOLIC PANEL WITH GFR   No orders of the defined types were placed in this encounter.     Follow-Up Instructions: Return in about 5 months (around 10/18/2020) for Rheumatoid arthritis, Osteoarthritis.   Ofilia Neas, PA-C  Note - This record has  been created using [employer].  Chart creation errors have been sought, but may not always  have been located. Such creation errors do not reflect on  the standard of medical care.

## 2020-05-13 DIAGNOSIS — Z6828 Body mass index (BMI) 28.0-28.9, adult: Secondary | ICD-10-CM | POA: Diagnosis not present

## 2020-05-13 DIAGNOSIS — G20 Parkinson's disease: Secondary | ICD-10-CM | POA: Diagnosis not present

## 2020-05-13 DIAGNOSIS — M05761 Rheumatoid arthritis with rheumatoid factor of right knee without organ or systems involvement: Secondary | ICD-10-CM | POA: Diagnosis not present

## 2020-05-13 DIAGNOSIS — F419 Anxiety disorder, unspecified: Secondary | ICD-10-CM | POA: Diagnosis not present

## 2020-05-13 DIAGNOSIS — I10 Essential (primary) hypertension: Secondary | ICD-10-CM | POA: Diagnosis not present

## 2020-05-13 DIAGNOSIS — E785 Hyperlipidemia, unspecified: Secondary | ICD-10-CM | POA: Diagnosis not present

## 2020-05-13 DIAGNOSIS — J069 Acute upper respiratory infection, unspecified: Secondary | ICD-10-CM | POA: Diagnosis not present

## 2020-05-20 DIAGNOSIS — R928 Other abnormal and inconclusive findings on diagnostic imaging of breast: Secondary | ICD-10-CM | POA: Diagnosis not present

## 2020-05-20 DIAGNOSIS — R922 Inconclusive mammogram: Secondary | ICD-10-CM | POA: Diagnosis not present

## 2020-05-21 ENCOUNTER — Ambulatory Visit: Payer: Medicare HMO | Admitting: Physician Assistant

## 2020-05-21 ENCOUNTER — Other Ambulatory Visit: Payer: Self-pay

## 2020-05-21 ENCOUNTER — Ambulatory Visit: Payer: Self-pay

## 2020-05-21 ENCOUNTER — Encounter: Payer: Self-pay | Admitting: Physician Assistant

## 2020-05-21 VITALS — BP 152/94 | HR 72 | Resp 13 | Ht 61.0 in | Wt 152.2 lb

## 2020-05-21 DIAGNOSIS — M1711 Unilateral primary osteoarthritis, right knee: Secondary | ICD-10-CM

## 2020-05-21 DIAGNOSIS — M19041 Primary osteoarthritis, right hand: Secondary | ICD-10-CM | POA: Diagnosis not present

## 2020-05-21 DIAGNOSIS — Z79899 Other long term (current) drug therapy: Secondary | ICD-10-CM

## 2020-05-21 DIAGNOSIS — M2241 Chondromalacia patellae, right knee: Secondary | ICD-10-CM | POA: Diagnosis not present

## 2020-05-21 DIAGNOSIS — M19071 Primary osteoarthritis, right ankle and foot: Secondary | ICD-10-CM | POA: Diagnosis not present

## 2020-05-21 DIAGNOSIS — G8929 Other chronic pain: Secondary | ICD-10-CM | POA: Diagnosis not present

## 2020-05-21 DIAGNOSIS — M1712 Unilateral primary osteoarthritis, left knee: Secondary | ICD-10-CM

## 2020-05-21 DIAGNOSIS — M170 Bilateral primary osteoarthritis of knee: Secondary | ICD-10-CM

## 2020-05-21 DIAGNOSIS — Z8673 Personal history of transient ischemic attack (TIA), and cerebral infarction without residual deficits: Secondary | ICD-10-CM | POA: Diagnosis not present

## 2020-05-21 DIAGNOSIS — M25562 Pain in left knee: Secondary | ICD-10-CM

## 2020-05-21 DIAGNOSIS — Z8669 Personal history of other diseases of the nervous system and sense organs: Secondary | ICD-10-CM | POA: Diagnosis not present

## 2020-05-21 DIAGNOSIS — M25561 Pain in right knee: Secondary | ICD-10-CM

## 2020-05-21 DIAGNOSIS — M0579 Rheumatoid arthritis with rheumatoid factor of multiple sites without organ or systems involvement: Secondary | ICD-10-CM

## 2020-05-21 DIAGNOSIS — M19072 Primary osteoarthritis, left ankle and foot: Secondary | ICD-10-CM

## 2020-05-21 DIAGNOSIS — Z8639 Personal history of other endocrine, nutritional and metabolic disease: Secondary | ICD-10-CM | POA: Diagnosis not present

## 2020-05-21 DIAGNOSIS — M19042 Primary osteoarthritis, left hand: Secondary | ICD-10-CM

## 2020-05-21 DIAGNOSIS — M2242 Chondromalacia patellae, left knee: Secondary | ICD-10-CM

## 2020-05-21 DIAGNOSIS — Z13820 Encounter for screening for osteoporosis: Secondary | ICD-10-CM

## 2020-05-21 NOTE — Patient Instructions (Signed)
Hand Exercises Hand exercises can be helpful for almost anyone. These exercises can strengthen the hands, improve flexibility and movement, and increase blood flow to the hands. These results can make work and daily tasks easier. Hand exercises can be especially helpful for people who have joint pain from arthritis or have nerve damage from overuse (carpal tunnel syndrome). These exercises can also help people who have injured a hand. Exercises Most of these hand exercises are gentle stretching and motion exercises. It is usually safe to do them often throughout the day. Warming up your hands before exercise may help to reduce stiffness. You can do this with gentle massage or by placing your hands in warm water for 10-15 minutes. It is normal to feel some stretching, pulling, tightness, or mild discomfort as you begin new exercises. This will gradually improve. Stop an exercise right away if you feel sudden, severe pain or your pain gets worse. Ask your health care provider which exercises are best for you. Knuckle bend or "claw" fist 1. Stand or sit with your arm, hand, and all five fingers pointed straight up. Make sure to keep your wrist straight during the exercise. 2. Gently bend your fingers down toward your palm until the tips of your fingers are touching the top of your palm. Keep your big knuckle straight and just bend the small knuckles in your fingers. 3. Hold this position for __________ seconds. 4. Straighten (extend) your fingers back to the starting position. Repeat this exercise 5-10 times with each hand. Full finger fist 1. Stand or sit with your arm, hand, and all five fingers pointed straight up. Make sure to keep your wrist straight during the exercise. 2. Gently bend your fingers into your palm until the tips of your fingers are touching the middle of your palm. 3. Hold this position for __________ seconds. 4. Extend your fingers back to the starting position, stretching every  joint fully. Repeat this exercise 5-10 times with each hand. Straight fist 1. Stand or sit with your arm, hand, and all five fingers pointed straight up. Make sure to keep your wrist straight during the exercise. 2. Gently bend your fingers at the big knuckle, where your fingers meet your hand, and the middle knuckle. Keep the knuckle at the tips of your fingers straight and try to touch the bottom of your palm. 3. Hold this position for __________ seconds. 4. Extend your fingers back to the starting position, stretching every joint fully. Repeat this exercise 5-10 times with each hand. Tabletop 1. Stand or sit with your arm, hand, and all five fingers pointed straight up. Make sure to keep your wrist straight during the exercise. 2. Gently bend your fingers at the big knuckle, where your fingers meet your hand, as far down as you can while keeping the small knuckles in your fingers straight. Think of forming a tabletop with your fingers. 3. Hold this position for __________ seconds. 4. Extend your fingers back to the starting position, stretching every joint fully. Repeat this exercise 5-10 times with each hand. Finger spread 1. Place your hand flat on a table with your palm facing down. Make sure your wrist stays straight as you do this exercise. 2. Spread your fingers and thumb apart from each other as far as you can until you feel a gentle stretch. Hold this position for __________ seconds. 3. Bring your fingers and thumb tight together again. Hold this position for __________ seconds. Repeat this exercise 5-10 times with each hand.   Making circles 1. Stand or sit with your arm, hand, and all five fingers pointed straight up. Make sure to keep your wrist straight during the exercise. 2. Make a circle by touching the tip of your thumb to the tip of your index finger. 3. Hold for __________ seconds. Then open your hand wide. 4. Repeat this motion with your thumb and each finger on your  hand. Repeat this exercise 5-10 times with each hand. Thumb motion 1. Sit with your forearm resting on a table and your wrist straight. Your thumb should be facing up toward the ceiling. Keep your fingers relaxed as you move your thumb. 2. Lift your thumb up as high as you can toward the ceiling. Hold for __________ seconds. 3. Bend your thumb across your palm as far as you can, reaching the tip of your thumb for the small finger (pinkie) side of your palm. Hold for __________ seconds. Repeat this exercise 5-10 times with each hand. Grip strengthening 1. Hold a stress ball or other soft ball in the middle of your hand. 2. Slowly increase the pressure, squeezing the ball as much as you can without causing pain. Think of bringing the tips of your fingers into the middle of your palm. All of your finger joints should bend when doing this exercise. 3. Hold your squeeze for __________ seconds, then relax. Repeat this exercise 5-10 times with each hand.   Contact a health care provider if:  Your hand pain or discomfort gets much worse when you do an exercise.  Your hand pain or discomfort does not improve within 2 hours after you exercise. If you have any of these problems, stop doing these exercises right away. Do not do them again unless your health care provider says that you can. Get help right away if:  You develop sudden, severe hand pain or swelling. If this happens, stop doing these exercises right away. Do not do them again unless your health care provider says that you can. This information is not intended to replace advice given to you by your health care provider. Make sure you discuss any questions you have with your health care provider. Document Revised: 07/26/2018 Document Reviewed: 04/05/2018 Elsevier Patient Education  2021 [place] for Nurse Practitioners, 15(4), [phone]. Retrieved January 22, 2018 from http://clinicalkey.com/nursing">  Knee Exercises Ask your  health care provider which exercises are safe for you. Do exercises exactly as told by your health care provider and adjust them as directed. It is normal to feel mild stretching, pulling, tightness, or discomfort as you do these exercises. Stop right away if you feel sudden pain or your pain gets worse. Do not begin these exercises until told by your health care provider. Stretching and range-of-motion exercises These exercises warm up your muscles and joints and improve the movement and flexibility of your knee. These exercises also help to relieve pain and swelling. Knee extension, prone 1. Lie on your abdomen (prone position) on a bed. 2. Place your left / right knee just beyond the edge of the surface so your knee is not on the bed. You can put a towel under your left / right thigh just above your kneecap for comfort. 3. Relax your leg muscles and allow gravity to straighten your knee (extension). You should feel a stretch behind your left / right knee. 4. Hold this position for __________ seconds. 5. Scoot up so your knee is supported between repetitions. Repeat __________ times. Complete this exercise __________ times a day.  Knee flexion, active 1. Lie on your back with both legs straight. If this causes back discomfort, bend your left / right knee so your foot is flat on the floor. 2. Slowly slide your left / right heel back toward your buttocks. Stop when you feel a gentle stretch in the front of your knee or thigh (flexion). 3. Hold this position for __________ seconds. 4. Slowly slide your left / right heel back to the starting position. Repeat __________ times. Complete this exercise __________ times a day.   Quadriceps stretch, prone 1. Lie on your abdomen on a firm surface, such as a bed or padded floor. 2. Bend your left / right knee and hold your ankle. If you cannot reach your ankle or pant leg, loop a belt around your foot and grab the belt instead. 3. Gently pull your heel  toward your buttocks. Your knee should not slide out to the side. You should feel a stretch in the front of your thigh and knee (quadriceps). 4. Hold this position for __________ seconds. Repeat __________ times. Complete this exercise __________ times a day.   Hamstring, supine 1. Lie on your back (supine position). 2. Loop a belt or towel over the ball of your left / right foot. The ball of your foot is on the walking surface, right under your toes. 3. Straighten your left / right knee and slowly pull on the belt to raise your leg until you feel a gentle stretch behind your knee (hamstring). ? Do not let your knee bend while you do this. ? Keep your other leg flat on the floor. 4. Hold this position for __________ seconds. Repeat __________ times. Complete this exercise __________ times a day. Strengthening exercises These exercises build strength and endurance in your knee. Endurance is the ability to use your muscles for a long time, even after they get tired. Quadriceps, isometric This exercise stretches the muscles in front of your thigh (quadriceps) without moving your knee joint (isometric). 1. Lie on your back with your left / right leg extended and your other knee bent. Put a rolled towel or small pillow under your knee if told by your health care provider. 2. Slowly tense the muscles in the front of your left / right thigh. You should see your kneecap slide up toward your hip or see increased dimpling just above the knee. This motion will push the back of the knee toward the floor. 3. For __________ seconds, hold the muscle as tight as you can without increasing your pain. 4. Relax the muscles slowly and completely. Repeat __________ times. Complete this exercise __________ times a day.   Straight leg raises This exercise stretches the muscles in front of your thigh (quadriceps) and the muscles that move your hips (hip flexors). 1. Lie on your back with your left / right leg extended  and your other knee bent. 2. Tense the muscles in the front of your left / right thigh. You should see your kneecap slide up or see increased dimpling just above the knee. Your thigh may even shake a bit. 3. Keep these muscles tight as you raise your leg 4-6 inches (10-15 cm) off the floor. Do not let your knee bend. 4. Hold this position for __________ seconds. 5. Keep these muscles tense as you lower your leg. 6. Relax your muscles slowly and completely after each repetition. Repeat __________ times. Complete this exercise __________ times a day. Hamstring, isometric 1. Lie on your back on a firm  surface. 2. Bend your left / right knee about __________ degrees. 3. Dig your left / right heel into the surface as if you are trying to pull it toward your buttocks. Tighten the muscles in the back of your thighs (hamstring) to "dig" as hard as you can without increasing any pain. 4. Hold this position for __________ seconds. 5. Release the tension gradually and allow your muscles to relax completely for __________ seconds after each repetition. Repeat __________ times. Complete this exercise __________ times a day. Hamstring curls If told by your health care provider, do this exercise while wearing ankle weights. Begin with __________ lb weights. Then increase the weight by 1 lb (0.5 kg) increments. Do not wear ankle weights that are more than __________ lb. 1. Lie on your abdomen with your legs straight. 2. Bend your left / right knee as far as you can without feeling pain. Keep your hips flat against the floor. 3. Hold this position for __________ seconds. 4. Slowly lower your leg to the starting position. Repeat __________ times. Complete this exercise __________ times a day.   Squats This exercise strengthens the muscles in front of your thigh and knee (quadriceps). 1. Stand in front of a table, with your feet and knees pointing straight ahead. You may rest your hands on the table for balance  but not for support. 2. Slowly bend your knees and lower your hips like you are going to sit in a chair. ? Keep your weight over your heels, not over your toes. ? Keep your lower legs upright so they are parallel with the table legs. ? Do not let your hips go lower than your knees. ? Do not bend lower than told by your health care provider. ? If your knee pain increases, do not bend as low. 3. Hold the squat position for __________ seconds. 4. Slowly push with your legs to return to standing. Do not use your hands to pull yourself to standing. Repeat __________ times. Complete this exercise __________ times a day. Wall slides This exercise strengthens the muscles in front of your thigh and knee (quadriceps). 1. Lean your back against a smooth wall or door, and walk your feet out 18-24 inches (46-61 cm) from it. 2. Place your feet hip-width apart. 3. Slowly slide down the wall or door until your knees bend __________ degrees. Keep your knees over your heels, not over your toes. Keep your knees in line with your hips. 4. Hold this position for __________ seconds. Repeat __________ times. Complete this exercise __________ times a day.   Straight leg raises This exercise strengthens the muscles that rotate the leg at the hip and move it away from your body (hip abductors). 1. Lie on your side with your left / right leg in the top position. Lie so your head, shoulder, knee, and hip line up. You may bend your bottom knee to help you keep your balance. 2. Roll your hips slightly forward so your hips are stacked directly over each other and your left / right knee is facing forward. 3. Leading with your heel, lift your top leg 4-6 inches (10-15 cm). You should feel the muscles in your outer hip lifting. ? Do not let your foot drift forward. ? Do not let your knee roll toward the ceiling. 4. Hold this position for __________ seconds. 5. Slowly return your leg to the starting position. 6. Let your  muscles relax completely after each repetition. Repeat __________ times. Complete this exercise __________  times a day.   Straight leg raises This exercise stretches the muscles that move your hips away from the front of the pelvis (hip extensors). 1. Lie on your abdomen on a firm surface. You can put a pillow under your hips if that is more comfortable. 2. Tense the muscles in your buttocks and lift your left / right leg about 4-6 inches (10-15 cm). Keep your knee straight as you lift your leg. 3. Hold this position for __________ seconds. 4. Slowly lower your leg to the starting position. 5. Let your leg relax completely after each repetition. Repeat __________ times. Complete this exercise __________ times a day. This information is not intended to replace advice given to you by your health care provider. Make sure you discuss any questions you have with your health care provider. Document Revised: 01/23/2018 Document Reviewed: 01/23/2018 Elsevier Patient Education  2021 Elsevier Inc.  

## 2020-05-22 LAB — COMPLETE METABOLIC PANEL WITH GFR
AG Ratio: 2 (calc) (ref 1.0–2.5)
ALT: 11 U/L (ref 6–29)
AST: 20 U/L (ref 10–35)
Albumin: 3.9 g/dL (ref 3.6–5.1)
Alkaline phosphatase (APISO): 87 U/L (ref 37–153)
BUN: 16 mg/dL (ref 7–25)
CO2: 29 mmol/L (ref 20–32)
Calcium: 9.4 mg/dL (ref 8.6–10.4)
Chloride: 99 mmol/L (ref 98–110)
Creat: 0.76 mg/dL (ref 0.60–0.93)
GFR, Est African American: 90 mL/min/{1.73_m2} (ref >=60)
GFR, Est Non African American: 78 mL/min/{1.73_m2} (ref >=60)
Globulin: 2 g/dL (calc) (ref 1.9–3.7)
Glucose, Bld: 97 mg/dL (ref 65–99)
Potassium: 4.5 mmol/L (ref 3.5–5.3)
Sodium: 135 mmol/L (ref 135–146)
Total Bilirubin: 0.3 mg/dL (ref 0.2–1.2)
Total Protein: 5.9 g/dL — ABNORMAL LOW (ref 6.1–8.1)

## 2020-05-22 LAB — CBC WITH DIFFERENTIAL/PLATELET
Absolute Monocytes: 538 cells/uL (ref 200–950)
Basophils Absolute: 77 cells/uL (ref 0–200)
Basophils Relative: 1.2 %
Eosinophils Absolute: 141 cells/uL (ref 15–500)
Eosinophils Relative: 2.2 %
HCT: 38.7 % (ref 35.0–45.0)
Hemoglobin: 13.2 g/dL (ref 11.7–15.5)
Lymphs Abs: 2221 cells/uL (ref 850–3900)
MCH: 29.2 pg (ref 27.0–33.0)
MCHC: 34.1 g/dL (ref 32.0–36.0)
MCV: 85.6 fL (ref 80.0–100.0)
MPV: 9.4 fL (ref 7.5–12.5)
Monocytes Relative: 8.4 %
Neutro Abs: 3424 cells/uL (ref 1500–7800)
Neutrophils Relative %: 53.5 %
Platelets: 310 10*3/uL (ref 140–400)
RBC: 4.52 10*6/uL (ref 3.80–5.10)
RDW: 12.9 % (ref 11.0–15.0)
Total Lymphocyte: 34.7 %
WBC: 6.4 10*3/uL (ref 3.8–10.8)

## 2020-05-22 NOTE — Progress Notes (Signed)
I will review x-rays with the patient at [place] appointment on 05/25/20.

## 2020-05-22 NOTE — Progress Notes (Signed)
Total protein borderline low.  Rest of CMP WNL. CBC WNL.

## 2020-05-25 ENCOUNTER — Ambulatory Visit: Payer: Medicare HMO | Admitting: Physician Assistant

## 2020-05-25 ENCOUNTER — Other Ambulatory Visit: Payer: Self-pay

## 2020-05-25 DIAGNOSIS — M170 Bilateral primary osteoarthritis of knee: Secondary | ICD-10-CM

## 2020-05-25 MED ORDER — LIDOCAINE HCL 1 % IJ SOLN
1.5000 mL | INTRAMUSCULAR | Status: AC | PRN
  Administered 2020-05-25: 1.5 mL

## 2020-05-25 MED ORDER — HYALURONAN 30 MG/2ML IX SOSY
30.0000 mg | PREFILLED_SYRINGE | INTRA_ARTICULAR | Status: AC | PRN
  Administered 2020-05-25: 30 mg via INTRA_ARTICULAR

## 2020-05-25 MED ORDER — HYALURONAN 30 MG/2ML IX SOSY
30.0000 mg | PREFILLED_SYRINGE | INTRA_ARTICULAR | Status: AC | PRN
Start: 2020-05-25 — End: 2020-05-25
  Administered 2020-05-25: 30 mg via INTRA_ARTICULAR

## 2020-05-25 NOTE — Progress Notes (Signed)
   Procedure Note  Patient: Delesa Kawa             Date of Birth: 07-28-1945           MRN: [id number]             Visit Date: 05/25/2020  Procedures: Visit Diagnoses:  1. Primary osteoarthritis of both knees     Orthovisc #1 bilateral knees, B/B Large Joint Inj: bilateral knee on 05/25/2020 8:54 AM Indications: pain Details: 25 G 1.5 in needle, medial approach  Arthrogram: No  Medications (Right): 1.5 mL lidocaine 1 %; 30 mg Hyaluronan 30 MG/2ML Aspirate (Right): 0 mL Medications (Left): 1.5 mL lidocaine 1 %; 30 mg Hyaluronan 30 MG/2ML Aspirate (Left): 0 mL Outcome: tolerated well, no immediate complications Procedure, treatment alternatives, risks and benefits explained, specific risks discussed. Consent was given by the patient. Immediately prior to procedure a time out was called to verify the correct patient, procedure, equipment, support staff and site/side marked as required. Patient was prepped and draped in the usual sterile fashion.     Patient tolerated the procedure well.  Aftercare was discussed.  Hazel Sams, PA-C

## 2020-06-01 ENCOUNTER — Other Ambulatory Visit: Payer: Self-pay

## 2020-06-01 ENCOUNTER — Ambulatory Visit: Payer: Medicare HMO | Admitting: Physician Assistant

## 2020-06-01 ENCOUNTER — Encounter: Payer: Self-pay | Admitting: Gastroenterology

## 2020-06-01 DIAGNOSIS — M170 Bilateral primary osteoarthritis of knee: Secondary | ICD-10-CM

## 2020-06-01 MED ORDER — LIDOCAINE HCL 1 % IJ SOLN
1.5000 mL | INTRAMUSCULAR | Status: AC | PRN
  Administered 2020-06-01: 1.5 mL

## 2020-06-01 MED ORDER — HYALURONAN 30 MG/2ML IX SOSY
30.0000 mg | PREFILLED_SYRINGE | INTRA_ARTICULAR | Status: AC | PRN
  Administered 2020-06-01: 30 mg via INTRA_ARTICULAR

## 2020-06-01 NOTE — Progress Notes (Signed)
   Procedure Note  Patient: Dawn Harrell             Date of Birth: 1946-03-08           MRN: [id number]             Visit Date: 06/01/2020  Procedures: Visit Diagnoses:  1. Primary osteoarthritis of both knees    Orthovisc #2 Bilateral knee orthovisc injections  Large Joint Inj: bilateral knee on 06/01/2020 7:57 AM Indications: pain Details: 25 G 1.5 in needle, medial approach  Arthrogram: No  Medications (Right): 1.5 mL lidocaine 1 %; 30 mg Hyaluronan 30 MG/2ML Aspirate (Right): 0 mL Medications (Left): 1.5 mL lidocaine 1 %; 30 mg Hyaluronan 30 MG/2ML Aspirate (Left): 0 mL Outcome: tolerated well, no immediate complications Procedure, treatment alternatives, risks and benefits explained, specific risks discussed. Consent was given by the patient. Immediately prior to procedure a time out was called to verify the correct patient, procedure, equipment, support staff and site/side marked as required. Patient was prepped and draped in the usual sterile fashion.    Patient tolerated the procedure well.  Aftercare was discussed.  Hazel Sams, PA-C

## 2020-06-08 ENCOUNTER — Other Ambulatory Visit: Payer: Self-pay

## 2020-06-08 ENCOUNTER — Ambulatory Visit: Payer: Medicare HMO | Admitting: Physician Assistant

## 2020-06-08 DIAGNOSIS — M170 Bilateral primary osteoarthritis of knee: Secondary | ICD-10-CM | POA: Diagnosis not present

## 2020-06-08 MED ORDER — HYALURONAN 30 MG/2ML IX SOSY
30.0000 mg | PREFILLED_SYRINGE | INTRA_ARTICULAR | Status: AC | PRN
  Administered 2020-06-08: 30 mg via INTRA_ARTICULAR

## 2020-06-08 MED ORDER — LIDOCAINE HCL 1 % IJ SOLN
1.5000 mL | INTRAMUSCULAR | Status: AC | PRN
  Administered 2020-06-08: 1.5 mL

## 2020-06-08 MED ORDER — LIDOCAINE HCL 1 % IJ SOLN
1.5000 mL | INTRAMUSCULAR | Status: AC | PRN
Start: 2020-06-08 — End: 2020-06-08
  Administered 2020-06-08: 1.5 mL

## 2020-06-08 NOTE — Progress Notes (Signed)
   Procedure Note  Patient: Dawn Harrell             Date of Birth: Dec 25, 1945           MRN: [id number]             Visit Date: 06/08/2020  Procedures: Visit Diagnoses:  1. Primary osteoarthritis of both knees      Orthovisc #3 bilateral knees, B/B Large Joint Inj: bilateral knee on 06/08/2020 9:54 AM Indications: pain Details: 25 G 1.5 in needle, medial approach  Arthrogram: No  Medications (Right): 1.5 mL lidocaine 1 %; 30 mg Hyaluronan 30 MG/2ML Aspirate (Right): 0 mL Medications (Left): 1.5 mL lidocaine 1 %; 30 mg Hyaluronan 30 MG/2ML Aspirate (Left): 0 mL Outcome: tolerated well, no immediate complications Procedure, treatment alternatives, risks and benefits explained, specific risks discussed. Consent was given by the patient. Immediately prior to procedure a time out was called to verify the correct patient, procedure, equipment, support staff and site/side marked as required. Patient was prepped and draped in the usual sterile fashion.     Patient tolerated the procedure well.  Aftercare was discussed.  Hazel Sams, PA-C

## 2020-06-10 IMAGING — DX DG CHEST 2V
2 series · 2 of 2 positions shown · non-contrast
Comparison: None

CLINICAL DATA: Initiation of methotrexate therapy for arthritis

EXAM:
CHEST - 2 VIEW

[chest pa]
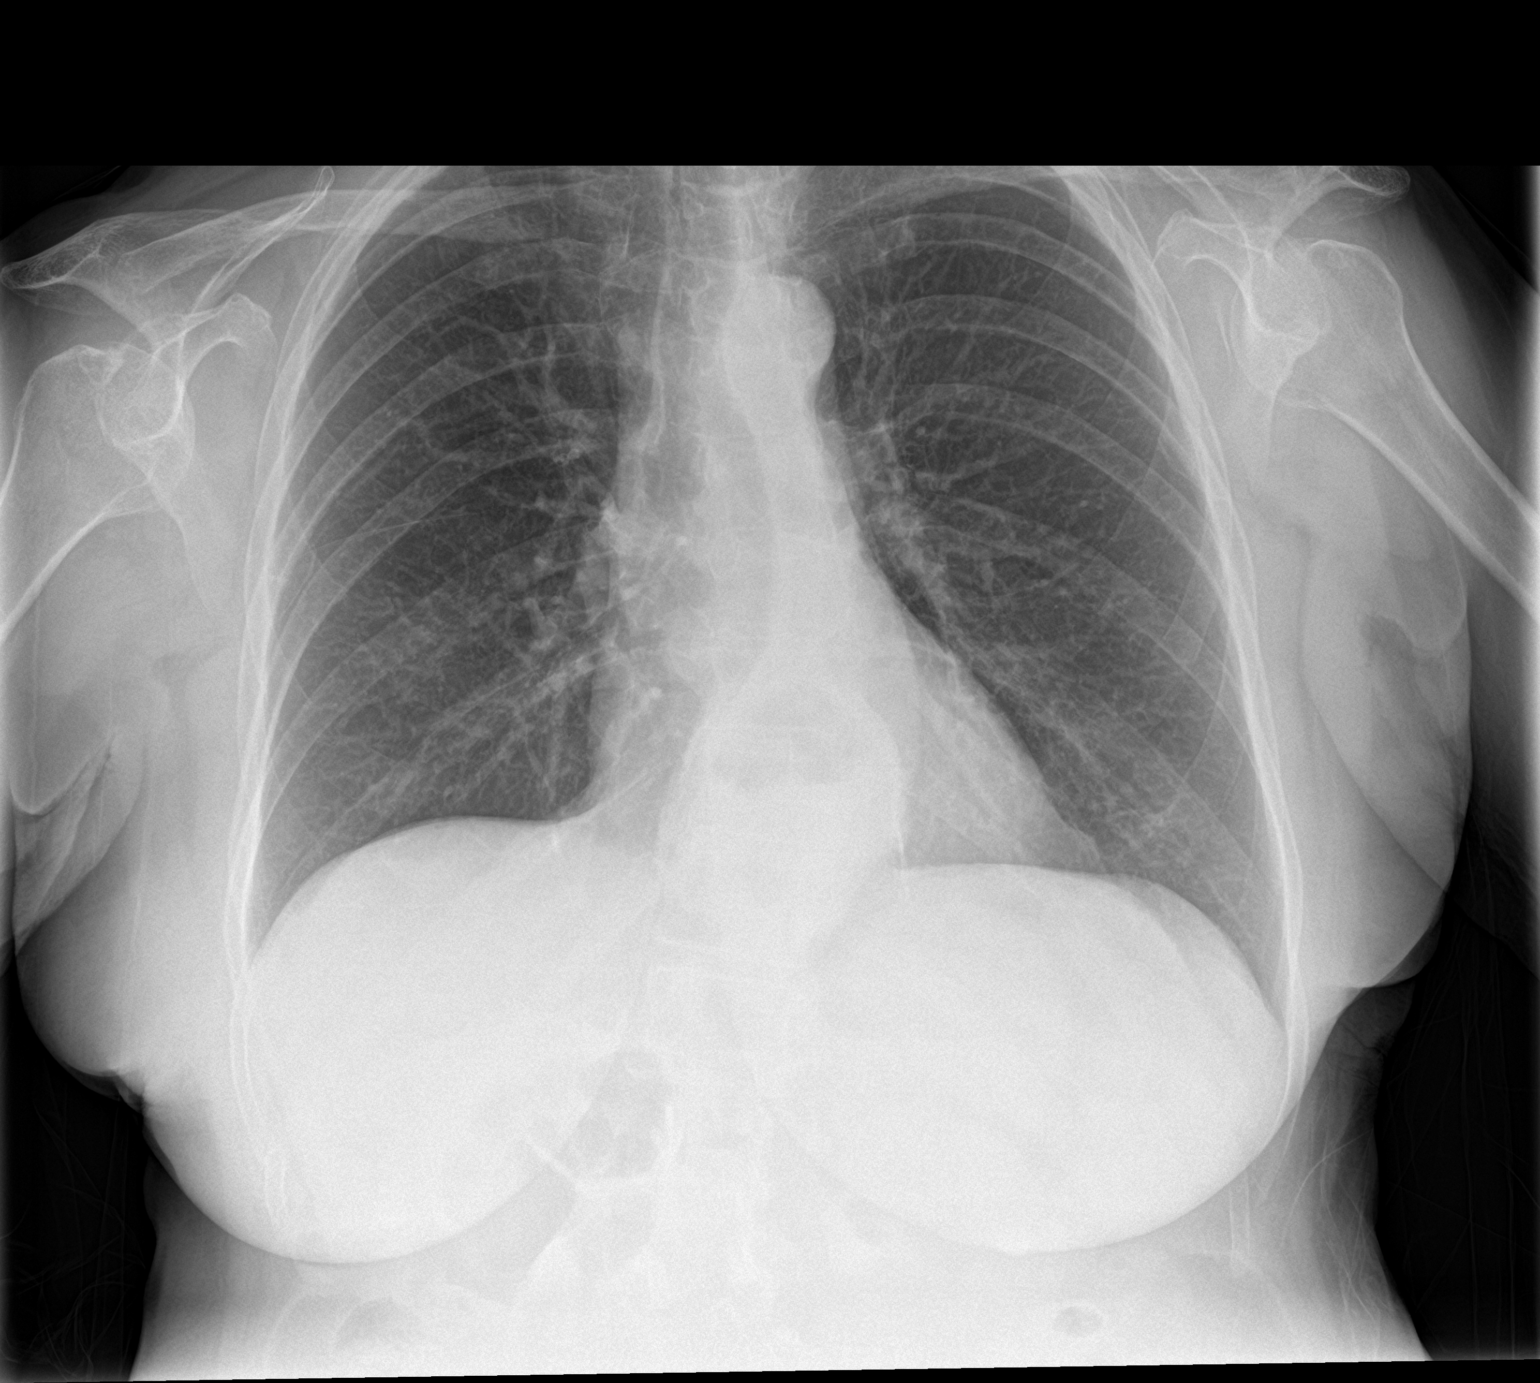

[chest lat]
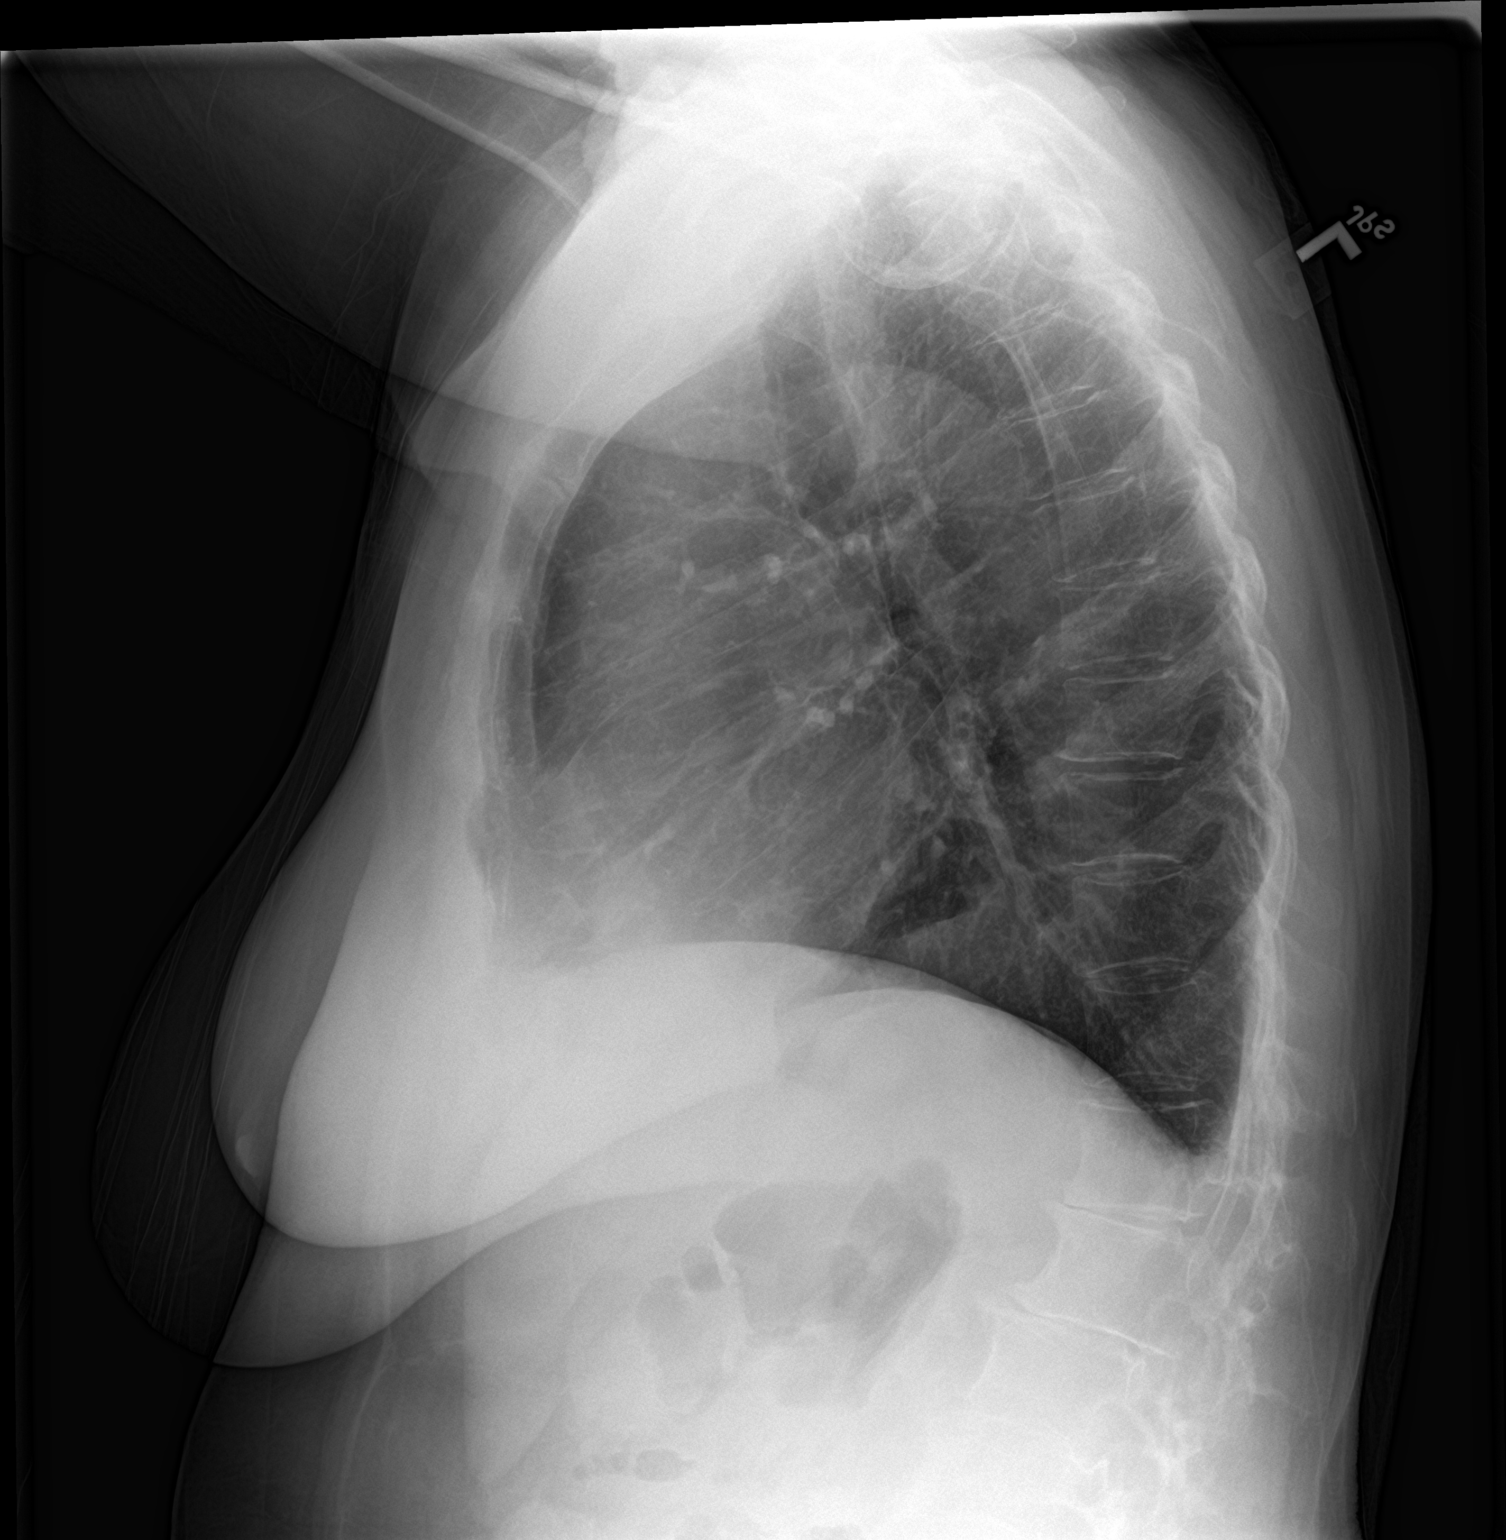

[2 of 2 positions shown; findings below may reference images not displayed]

FINDINGS: There is no focal parenchymal opacity. There is no pleural effusion
or pneumothorax. The heart and mediastinal contours are
unremarkable.

There is a moderate size hiatal hernia.

The osseous structures are unremarkable.
IMPRESSION: No active cardiopulmonary disease.

## 2020-06-22 DIAGNOSIS — Z86010 Personal history of colonic polyps: Secondary | ICD-10-CM | POA: Diagnosis not present

## 2020-06-22 DIAGNOSIS — Z1211 Encounter for screening for malignant neoplasm of colon: Secondary | ICD-10-CM | POA: Diagnosis not present

## 2020-06-22 DIAGNOSIS — K635 Polyp of colon: Secondary | ICD-10-CM | POA: Diagnosis not present

## 2020-06-22 DIAGNOSIS — D120 Benign neoplasm of cecum: Secondary | ICD-10-CM | POA: Diagnosis not present

## 2020-06-22 DIAGNOSIS — D125 Benign neoplasm of sigmoid colon: Secondary | ICD-10-CM | POA: Diagnosis not present

## 2020-07-03 DIAGNOSIS — H47233 Glaucomatous optic atrophy, bilateral: Secondary | ICD-10-CM | POA: Diagnosis not present

## 2020-07-03 DIAGNOSIS — H40013 Open angle with borderline findings, low risk, bilateral: Secondary | ICD-10-CM | POA: Diagnosis not present

## 2020-07-14 DIAGNOSIS — G20 Parkinson's disease: Secondary | ICD-10-CM | POA: Diagnosis not present

## 2020-07-14 DIAGNOSIS — R3915 Urgency of urination: Secondary | ICD-10-CM | POA: Diagnosis not present

## 2020-07-14 DIAGNOSIS — M170 Bilateral primary osteoarthritis of knee: Secondary | ICD-10-CM | POA: Diagnosis not present

## 2020-07-14 DIAGNOSIS — G255 Other chorea: Secondary | ICD-10-CM | POA: Diagnosis not present

## 2020-07-22 ENCOUNTER — Other Ambulatory Visit: Payer: Self-pay

## 2020-07-22 DIAGNOSIS — M0579 Rheumatoid arthritis with rheumatoid factor of multiple sites without organ or systems involvement: Secondary | ICD-10-CM

## 2020-07-22 MED ORDER — HYDROXYCHLOROQUINE SULFATE 200 MG PO TABS: ORAL_TABLET | ORAL | 0 refills | Status: DC

## 2020-07-22 NOTE — Telephone Encounter (Signed)
Patient called requesting prescription refill of Hydroxychloroquine to be sent to [facility].

## 2020-07-22 NOTE — Telephone Encounter (Signed)
Last Visit: 2/3/202 Next Visit: 10/22/2020 Labs: 05/21/2020, Total protein borderline low. Rest of CMP WNL. CBC WNL.  Eye exam: 12/30/2019  Current Dose per office note 05/21/2020, Plaquenil 200 mg 1 tablet by mouth twice daily Monday through Friday only  DX: Rheumatoid arthritis involving multiple sites with positive rheumatoid factor   Last Fill: 05/06/2020  Okay to refill Plaquenil?

## 2020-07-28 DIAGNOSIS — M25562 Pain in left knee: Secondary | ICD-10-CM | POA: Diagnosis not present

## 2020-07-28 DIAGNOSIS — G8929 Other chronic pain: Secondary | ICD-10-CM | POA: Diagnosis not present

## 2020-07-28 DIAGNOSIS — M25561 Pain in right knee: Secondary | ICD-10-CM | POA: Diagnosis not present

## 2020-07-28 DIAGNOSIS — M170 Bilateral primary osteoarthritis of knee: Secondary | ICD-10-CM | POA: Diagnosis not present

## 2020-08-10 DIAGNOSIS — M170 Bilateral primary osteoarthritis of knee: Secondary | ICD-10-CM | POA: Diagnosis not present

## 2020-08-10 DIAGNOSIS — G20 Parkinson's disease: Secondary | ICD-10-CM | POA: Diagnosis not present

## 2020-08-10 DIAGNOSIS — I10 Essential (primary) hypertension: Secondary | ICD-10-CM | POA: Diagnosis not present

## 2020-08-10 DIAGNOSIS — M25569 Pain in unspecified knee: Secondary | ICD-10-CM | POA: Diagnosis not present

## 2020-08-10 DIAGNOSIS — Z6828 Body mass index (BMI) 28.0-28.9, adult: Secondary | ICD-10-CM | POA: Diagnosis not present

## 2020-08-10 DIAGNOSIS — Z01810 Encounter for preprocedural cardiovascular examination: Secondary | ICD-10-CM | POA: Diagnosis not present

## 2020-08-10 DIAGNOSIS — F419 Anxiety disorder, unspecified: Secondary | ICD-10-CM | POA: Diagnosis not present

## 2020-08-13 DIAGNOSIS — M25569 Pain in unspecified knee: Secondary | ICD-10-CM | POA: Diagnosis not present

## 2020-08-13 DIAGNOSIS — I10 Essential (primary) hypertension: Secondary | ICD-10-CM | POA: Diagnosis not present

## 2020-08-13 DIAGNOSIS — Z01818 Encounter for other preprocedural examination: Secondary | ICD-10-CM | POA: Diagnosis not present

## 2020-08-20 DIAGNOSIS — I10 Essential (primary) hypertension: Secondary | ICD-10-CM | POA: Diagnosis not present

## 2020-08-20 DIAGNOSIS — K5903 Drug induced constipation: Secondary | ICD-10-CM | POA: Diagnosis not present

## 2020-08-20 DIAGNOSIS — T402X5A Adverse effect of other opioids, initial encounter: Secondary | ICD-10-CM | POA: Diagnosis not present

## 2020-08-20 DIAGNOSIS — Z01810 Encounter for preprocedural cardiovascular examination: Secondary | ICD-10-CM | POA: Diagnosis not present

## 2020-08-20 DIAGNOSIS — M25569 Pain in unspecified knee: Secondary | ICD-10-CM | POA: Diagnosis not present

## 2020-08-28 DIAGNOSIS — I340 Nonrheumatic mitral (valve) insufficiency: Secondary | ICD-10-CM | POA: Diagnosis not present

## 2020-08-28 DIAGNOSIS — I351 Nonrheumatic aortic (valve) insufficiency: Secondary | ICD-10-CM | POA: Diagnosis not present

## 2020-08-28 DIAGNOSIS — Z01810 Encounter for preprocedural cardiovascular examination: Secondary | ICD-10-CM | POA: Diagnosis not present

## 2020-08-28 DIAGNOSIS — I361 Nonrheumatic tricuspid (valve) insufficiency: Secondary | ICD-10-CM | POA: Diagnosis not present

## 2020-09-03 DIAGNOSIS — Z01810 Encounter for preprocedural cardiovascular examination: Secondary | ICD-10-CM | POA: Diagnosis not present

## 2020-09-03 DIAGNOSIS — T402X5A Adverse effect of other opioids, initial encounter: Secondary | ICD-10-CM | POA: Diagnosis not present

## 2020-09-03 DIAGNOSIS — M25569 Pain in unspecified knee: Secondary | ICD-10-CM | POA: Diagnosis not present

## 2020-09-03 DIAGNOSIS — F419 Anxiety disorder, unspecified: Secondary | ICD-10-CM | POA: Diagnosis not present

## 2020-09-03 DIAGNOSIS — G20 Parkinson's disease: Secondary | ICD-10-CM | POA: Diagnosis not present

## 2020-09-03 DIAGNOSIS — I10 Essential (primary) hypertension: Secondary | ICD-10-CM | POA: Diagnosis not present

## 2020-09-03 DIAGNOSIS — K5903 Drug induced constipation: Secondary | ICD-10-CM | POA: Diagnosis not present

## 2020-09-03 DIAGNOSIS — M05761 Rheumatoid arthritis with rheumatoid factor of right knee without organ or systems involvement: Secondary | ICD-10-CM | POA: Diagnosis not present

## 2020-09-03 DIAGNOSIS — Z6829 Body mass index (BMI) 29.0-29.9, adult: Secondary | ICD-10-CM | POA: Diagnosis not present

## 2020-09-22 DIAGNOSIS — M170 Bilateral primary osteoarthritis of knee: Secondary | ICD-10-CM | POA: Diagnosis not present

## 2020-09-30 DIAGNOSIS — R4701 Aphasia: Secondary | ICD-10-CM | POA: Diagnosis not present

## 2020-09-30 DIAGNOSIS — R29818 Other symptoms and signs involving the nervous system: Secondary | ICD-10-CM | POA: Diagnosis not present

## 2020-09-30 DIAGNOSIS — M1990 Unspecified osteoarthritis, unspecified site: Secondary | ICD-10-CM | POA: Diagnosis not present

## 2020-09-30 DIAGNOSIS — G894 Chronic pain syndrome: Secondary | ICD-10-CM | POA: Diagnosis not present

## 2020-09-30 DIAGNOSIS — R457 State of emotional shock and stress, unspecified: Secondary | ICD-10-CM | POA: Diagnosis not present

## 2020-09-30 DIAGNOSIS — Z7982 Long term (current) use of aspirin: Secondary | ICD-10-CM | POA: Diagnosis not present

## 2020-09-30 DIAGNOSIS — I672 Cerebral atherosclerosis: Secondary | ICD-10-CM | POA: Diagnosis not present

## 2020-09-30 DIAGNOSIS — I69951 Hemiplegia and hemiparesis following unspecified cerebrovascular disease affecting right dominant side: Secondary | ICD-10-CM | POA: Diagnosis not present

## 2020-09-30 DIAGNOSIS — R4781 Slurred speech: Secondary | ICD-10-CM | POA: Diagnosis not present

## 2020-09-30 DIAGNOSIS — I351 Nonrheumatic aortic (valve) insufficiency: Secondary | ICD-10-CM | POA: Diagnosis not present

## 2020-09-30 DIAGNOSIS — G20 Parkinson's disease: Secondary | ICD-10-CM | POA: Diagnosis not present

## 2020-09-30 DIAGNOSIS — I10 Essential (primary) hypertension: Secondary | ICD-10-CM | POA: Diagnosis not present

## 2020-09-30 DIAGNOSIS — R4702 Dysphasia: Secondary | ICD-10-CM | POA: Diagnosis not present

## 2020-09-30 DIAGNOSIS — I6782 Cerebral ischemia: Secondary | ICD-10-CM | POA: Diagnosis not present

## 2020-09-30 DIAGNOSIS — Z888 Allergy status to other drugs, medicaments and biological substances status: Secondary | ICD-10-CM | POA: Diagnosis not present

## 2020-09-30 DIAGNOSIS — Z79899 Other long term (current) drug therapy: Secondary | ICD-10-CM | POA: Diagnosis not present

## 2020-09-30 DIAGNOSIS — K449 Diaphragmatic hernia without obstruction or gangrene: Secondary | ICD-10-CM | POA: Diagnosis not present

## 2020-09-30 DIAGNOSIS — I340 Nonrheumatic mitral (valve) insufficiency: Secondary | ICD-10-CM | POA: Diagnosis not present

## 2020-09-30 DIAGNOSIS — R531 Weakness: Secondary | ICD-10-CM | POA: Diagnosis not present

## 2020-09-30 DIAGNOSIS — G459 Transient cerebral ischemic attack, unspecified: Secondary | ICD-10-CM | POA: Diagnosis not present

## 2020-09-30 DIAGNOSIS — I639 Cerebral infarction, unspecified: Secondary | ICD-10-CM | POA: Diagnosis not present

## 2020-10-01 ENCOUNTER — Other Ambulatory Visit: Payer: Self-pay

## 2020-10-02 DIAGNOSIS — I129 Hypertensive chronic kidney disease with stage 1 through stage 4 chronic kidney disease, or unspecified chronic kidney disease: Secondary | ICD-10-CM | POA: Diagnosis not present

## 2020-10-02 DIAGNOSIS — G894 Chronic pain syndrome: Secondary | ICD-10-CM | POA: Diagnosis not present

## 2020-10-02 DIAGNOSIS — N182 Chronic kidney disease, stage 2 (mild): Secondary | ICD-10-CM | POA: Diagnosis not present

## 2020-10-02 DIAGNOSIS — K5903 Drug induced constipation: Secondary | ICD-10-CM | POA: Diagnosis not present

## 2020-10-02 DIAGNOSIS — G20 Parkinson's disease: Secondary | ICD-10-CM | POA: Diagnosis not present

## 2020-10-02 DIAGNOSIS — F32A Depression, unspecified: Secondary | ICD-10-CM | POA: Diagnosis not present

## 2020-10-02 DIAGNOSIS — I69351 Hemiplegia and hemiparesis following cerebral infarction affecting right dominant side: Secondary | ICD-10-CM | POA: Diagnosis not present

## 2020-10-02 DIAGNOSIS — M05761 Rheumatoid arthritis with rheumatoid factor of right knee without organ or systems involvement: Secondary | ICD-10-CM | POA: Diagnosis not present

## 2020-10-02 DIAGNOSIS — M170 Bilateral primary osteoarthritis of knee: Secondary | ICD-10-CM | POA: Diagnosis not present

## 2020-10-02 DIAGNOSIS — G459 Transient cerebral ischemic attack, unspecified: Secondary | ICD-10-CM | POA: Diagnosis not present

## 2020-10-06 DIAGNOSIS — G459 Transient cerebral ischemic attack, unspecified: Secondary | ICD-10-CM | POA: Diagnosis not present

## 2020-10-06 DIAGNOSIS — Z6827 Body mass index (BMI) 27.0-27.9, adult: Secondary | ICD-10-CM | POA: Diagnosis not present

## 2020-10-06 DIAGNOSIS — G20 Parkinson's disease: Secondary | ICD-10-CM | POA: Diagnosis not present

## 2020-10-06 DIAGNOSIS — Z79899 Other long term (current) drug therapy: Secondary | ICD-10-CM | POA: Diagnosis not present

## 2020-10-06 DIAGNOSIS — E871 Hypo-osmolality and hyponatremia: Secondary | ICD-10-CM | POA: Diagnosis not present

## 2020-10-06 DIAGNOSIS — E785 Hyperlipidemia, unspecified: Secondary | ICD-10-CM | POA: Diagnosis not present

## 2020-10-06 DIAGNOSIS — Z7409 Other reduced mobility: Secondary | ICD-10-CM | POA: Diagnosis not present

## 2020-10-06 DIAGNOSIS — I10 Essential (primary) hypertension: Secondary | ICD-10-CM | POA: Diagnosis not present

## 2020-10-06 DIAGNOSIS — M25569 Pain in unspecified knee: Secondary | ICD-10-CM | POA: Diagnosis not present

## 2020-10-07 DIAGNOSIS — N182 Chronic kidney disease, stage 2 (mild): Secondary | ICD-10-CM | POA: Diagnosis not present

## 2020-10-07 DIAGNOSIS — F32A Depression, unspecified: Secondary | ICD-10-CM | POA: Diagnosis not present

## 2020-10-07 DIAGNOSIS — I129 Hypertensive chronic kidney disease with stage 1 through stage 4 chronic kidney disease, or unspecified chronic kidney disease: Secondary | ICD-10-CM | POA: Diagnosis not present

## 2020-10-07 DIAGNOSIS — G20 Parkinson's disease: Secondary | ICD-10-CM | POA: Diagnosis not present

## 2020-10-07 DIAGNOSIS — M05761 Rheumatoid arthritis with rheumatoid factor of right knee without organ or systems involvement: Secondary | ICD-10-CM | POA: Diagnosis not present

## 2020-10-07 DIAGNOSIS — G894 Chronic pain syndrome: Secondary | ICD-10-CM | POA: Diagnosis not present

## 2020-10-07 DIAGNOSIS — M170 Bilateral primary osteoarthritis of knee: Secondary | ICD-10-CM | POA: Diagnosis not present

## 2020-10-07 DIAGNOSIS — I69351 Hemiplegia and hemiparesis following cerebral infarction affecting right dominant side: Secondary | ICD-10-CM | POA: Diagnosis not present

## 2020-10-07 DIAGNOSIS — K5903 Drug induced constipation: Secondary | ICD-10-CM | POA: Diagnosis not present

## 2020-10-08 NOTE — Progress Notes (Deleted)
Office Visit Note  Patient: Dawn Harrell             Date of Birth: [birth year]           MRN: [id number]             PCP: Lowella Dandy, NP Referring: Nicoletta Dress, MD Visit Date: 10/22/2020 Occupation: @GUAROCC @  Subjective:  No chief complaint on file.   History of Present Illness: Dawn Harrell is a 75 y.o. female ***   Activities of Daily Living:  Patient reports morning stiffness for *** {minute/hour:19697}.   Patient {ACTIONS;DENIES/REPORTS:21021675::"Denies"} nocturnal pain.  Difficulty dressing/grooming: {ACTIONS;DENIES/REPORTS:21021675::"Denies"} Difficulty climbing stairs: {ACTIONS;DENIES/REPORTS:21021675::"Denies"} Difficulty getting out of chair: {ACTIONS;DENIES/REPORTS:21021675::"Denies"} Difficulty using hands for taps, buttons, cutlery, and/or writing: {ACTIONS;DENIES/REPORTS:21021675::"Denies"}  No Rheumatology ROS completed.   PMFS History:  Patient Active Problem List   Diagnosis Date Noted   Chondromalacia of both patellae 10/04/2016   History of tremor/ Parkinsons dx.  10/04/2016   History of TIA (transient ischemic attack) 10/04/2016   History of hyperlipidemia 10/04/2016   Tremor 03/24/2016   Rheumatoid arthritis involving multiple sites with positive rheumatoid factor ([place]) 03/24/2016   Primary osteoarthritis of both hands 03/24/2016   Primary osteoarthritis of both knees 03/24/2016   Primary osteoarthritis of both feet 03/24/2016   High risk medication use 03/24/2016    Past Medical History:  Diagnosis Date   Arthritis    Parkinson's disease ([place])    skin cancer     Family History  Problem Relation Age of Onset   Stroke Mother    Heart attack Father    Rheum arthritis Sister    Lung disease Brother    Diabetes Maternal Grandmother    Diabetes Paternal Grandmother    Leukemia Sister    Past Surgical History:  Procedure Laterality Date   ABDOMINAL HYSTERECTOMY     bladder tack     FOOT SURGERY     SKIN CANCER EXCISION  11/2015    SQUAMOUS CELL CARCINOMA EXCISION  03/2018   right arm   Social History   Social History Narrative   Not on file   Immunization History  Administered Date(s) Administered   Moderna Sars-Covid-2 Vaccination 05/23/2019, 06/20/2019     Objective: Vital Signs: There were no vitals taken for this visit.   Physical Exam   Musculoskeletal Exam:   CDAI Exam: CDAI Score: -- Patient Global: --; Provider Global: -- Swollen: --; Tender: -- Joint Exam 10/22/2020   No joint exam has been documented for this visit   There is currently no information documented on the homunculus. Go to the Rheumatology activity and complete the homunculus joint exam.  Investigation: No additional findings.  Imaging: No results found.  Recent Labs: Lab Results  Component Value Date   WBC 6.4 05/21/2020   HGB 13.2 05/21/2020   PLT 310 05/21/2020   NA 135 05/21/2020   K 4.5 05/21/2020   CL 99 05/21/2020   CO2 29 05/21/2020   GLUCOSE 97 05/21/2020   BUN 16 05/21/2020   CREATININE 0.76 05/21/2020   BILITOT 0.3 05/21/2020   ALKPHOS 106 10/30/2019   AST 20 05/21/2020   ALT 11 05/21/2020   PROT 5.9 (L) 05/21/2020   ALBUMIN 4.4 10/30/2019   CALCIUM 9.4 05/21/2020   GFRAA 90 05/21/2020   QFTBGOLDPLUS NEGATIVE 01/23/2018    Speciality Comments: PLQ Eye Exam: 12/30/2019 WNL @ [place] up in 1 year  Immunosupressant Lab Work:  TB gold-negative 09/04/13  Hepatitis panel- negative  except Hep A IgM antibody 09/04/13  Procedures:  No procedures performed Allergies: Hydrocodone-acetaminophen and Gabapentin   Assessment / Plan:     Visit Diagnoses: No diagnosis found.  Orders: No orders of the defined types were placed in this encounter.  No orders of the defined types were placed in this encounter.   Face-to-face time spent with patient was *** minutes. Greater than 50% of time was spent in counseling and coordination of care.  Follow-Up Instructions: No follow-ups on  file.   Earnestine Mealing, CMA  Note - This record has been created using Editor, commissioning.  Chart creation errors have been sought, but may not always  have been located. Such creation errors do not reflect on  the standard of medical care.

## 2020-10-09 DIAGNOSIS — N182 Chronic kidney disease, stage 2 (mild): Secondary | ICD-10-CM | POA: Diagnosis not present

## 2020-10-09 DIAGNOSIS — K5903 Drug induced constipation: Secondary | ICD-10-CM | POA: Diagnosis not present

## 2020-10-09 DIAGNOSIS — I129 Hypertensive chronic kidney disease with stage 1 through stage 4 chronic kidney disease, or unspecified chronic kidney disease: Secondary | ICD-10-CM | POA: Diagnosis not present

## 2020-10-09 DIAGNOSIS — I69351 Hemiplegia and hemiparesis following cerebral infarction affecting right dominant side: Secondary | ICD-10-CM | POA: Diagnosis not present

## 2020-10-09 DIAGNOSIS — M05761 Rheumatoid arthritis with rheumatoid factor of right knee without organ or systems involvement: Secondary | ICD-10-CM | POA: Diagnosis not present

## 2020-10-09 DIAGNOSIS — G20 Parkinson's disease: Secondary | ICD-10-CM | POA: Diagnosis not present

## 2020-10-09 DIAGNOSIS — M170 Bilateral primary osteoarthritis of knee: Secondary | ICD-10-CM | POA: Diagnosis not present

## 2020-10-09 DIAGNOSIS — G894 Chronic pain syndrome: Secondary | ICD-10-CM | POA: Diagnosis not present

## 2020-10-09 DIAGNOSIS — F32A Depression, unspecified: Secondary | ICD-10-CM | POA: Diagnosis not present

## 2020-10-13 DIAGNOSIS — M170 Bilateral primary osteoarthritis of knee: Secondary | ICD-10-CM | POA: Diagnosis not present

## 2020-10-13 DIAGNOSIS — G894 Chronic pain syndrome: Secondary | ICD-10-CM | POA: Diagnosis not present

## 2020-10-13 DIAGNOSIS — M05761 Rheumatoid arthritis with rheumatoid factor of right knee without organ or systems involvement: Secondary | ICD-10-CM | POA: Diagnosis not present

## 2020-10-13 DIAGNOSIS — N182 Chronic kidney disease, stage 2 (mild): Secondary | ICD-10-CM | POA: Diagnosis not present

## 2020-10-13 DIAGNOSIS — G20 Parkinson's disease: Secondary | ICD-10-CM | POA: Diagnosis not present

## 2020-10-13 DIAGNOSIS — K5903 Drug induced constipation: Secondary | ICD-10-CM | POA: Diagnosis not present

## 2020-10-13 DIAGNOSIS — I129 Hypertensive chronic kidney disease with stage 1 through stage 4 chronic kidney disease, or unspecified chronic kidney disease: Secondary | ICD-10-CM | POA: Diagnosis not present

## 2020-10-13 DIAGNOSIS — F32A Depression, unspecified: Secondary | ICD-10-CM | POA: Diagnosis not present

## 2020-10-13 DIAGNOSIS — I69351 Hemiplegia and hemiparesis following cerebral infarction affecting right dominant side: Secondary | ICD-10-CM | POA: Diagnosis not present

## 2020-10-14 DIAGNOSIS — M170 Bilateral primary osteoarthritis of knee: Secondary | ICD-10-CM | POA: Diagnosis not present

## 2020-10-14 DIAGNOSIS — G20 Parkinson's disease: Secondary | ICD-10-CM | POA: Diagnosis not present

## 2020-10-14 DIAGNOSIS — F32A Depression, unspecified: Secondary | ICD-10-CM | POA: Diagnosis not present

## 2020-10-14 DIAGNOSIS — I129 Hypertensive chronic kidney disease with stage 1 through stage 4 chronic kidney disease, or unspecified chronic kidney disease: Secondary | ICD-10-CM | POA: Diagnosis not present

## 2020-10-14 DIAGNOSIS — M05761 Rheumatoid arthritis with rheumatoid factor of right knee without organ or systems involvement: Secondary | ICD-10-CM | POA: Diagnosis not present

## 2020-10-14 DIAGNOSIS — I69351 Hemiplegia and hemiparesis following cerebral infarction affecting right dominant side: Secondary | ICD-10-CM | POA: Diagnosis not present

## 2020-10-14 DIAGNOSIS — N182 Chronic kidney disease, stage 2 (mild): Secondary | ICD-10-CM | POA: Diagnosis not present

## 2020-10-14 DIAGNOSIS — K5903 Drug induced constipation: Secondary | ICD-10-CM | POA: Diagnosis not present

## 2020-10-14 DIAGNOSIS — G894 Chronic pain syndrome: Secondary | ICD-10-CM | POA: Diagnosis not present

## 2020-10-15 DIAGNOSIS — I69351 Hemiplegia and hemiparesis following cerebral infarction affecting right dominant side: Secondary | ICD-10-CM | POA: Diagnosis not present

## 2020-10-15 DIAGNOSIS — G894 Chronic pain syndrome: Secondary | ICD-10-CM | POA: Diagnosis not present

## 2020-10-15 DIAGNOSIS — I10 Essential (primary) hypertension: Secondary | ICD-10-CM | POA: Diagnosis not present

## 2020-10-15 DIAGNOSIS — M05761 Rheumatoid arthritis with rheumatoid factor of right knee without organ or systems involvement: Secondary | ICD-10-CM | POA: Diagnosis not present

## 2020-10-15 DIAGNOSIS — G255 Other chorea: Secondary | ICD-10-CM | POA: Diagnosis not present

## 2020-10-15 DIAGNOSIS — G20 Parkinson's disease: Secondary | ICD-10-CM | POA: Diagnosis not present

## 2020-10-15 DIAGNOSIS — M170 Bilateral primary osteoarthritis of knee: Secondary | ICD-10-CM | POA: Diagnosis not present

## 2020-10-15 DIAGNOSIS — N182 Chronic kidney disease, stage 2 (mild): Secondary | ICD-10-CM | POA: Diagnosis not present

## 2020-10-15 DIAGNOSIS — K5903 Drug induced constipation: Secondary | ICD-10-CM | POA: Diagnosis not present

## 2020-10-15 DIAGNOSIS — M25569 Pain in unspecified knee: Secondary | ICD-10-CM | POA: Diagnosis not present

## 2020-10-15 DIAGNOSIS — E871 Hypo-osmolality and hyponatremia: Secondary | ICD-10-CM | POA: Diagnosis not present

## 2020-10-15 DIAGNOSIS — I129 Hypertensive chronic kidney disease with stage 1 through stage 4 chronic kidney disease, or unspecified chronic kidney disease: Secondary | ICD-10-CM | POA: Diagnosis not present

## 2020-10-15 DIAGNOSIS — F32A Depression, unspecified: Secondary | ICD-10-CM | POA: Diagnosis not present

## 2020-10-16 ENCOUNTER — Other Ambulatory Visit: Payer: Self-pay | Admitting: *Deleted

## 2020-10-16 DIAGNOSIS — M0579 Rheumatoid arthritis with rheumatoid factor of multiple sites without organ or systems involvement: Secondary | ICD-10-CM

## 2020-10-16 MED ORDER — HYDROXYCHLOROQUINE SULFATE 200 MG PO TABS
ORAL_TABLET | ORAL | 0 refills | Status: DC
Start: 2020-10-16 — End: 2020-12-14

## 2020-10-16 NOTE — Telephone Encounter (Signed)
Last Visit: 05/21/2020 Next Visit: 10/22/2020 Labs: 05/21/2020 Total protein borderline low.  Rest of CMP WNL. CBC WNL.  Eye exam:  12/30/2019 WNL   Current Dose per office note 05/21/2020: Plaquenil 200 mg 1 tablet by mouth twice daily Monday through Friday only. JK:DTOIZTIWPY arthritis involving multiple sites with positive rheumatoid factor  Last Fill: 07/22/2020  Okay to refill Plaquenil?

## 2020-10-20 DIAGNOSIS — G894 Chronic pain syndrome: Secondary | ICD-10-CM | POA: Diagnosis not present

## 2020-10-20 DIAGNOSIS — I69351 Hemiplegia and hemiparesis following cerebral infarction affecting right dominant side: Secondary | ICD-10-CM | POA: Diagnosis not present

## 2020-10-20 DIAGNOSIS — K5903 Drug induced constipation: Secondary | ICD-10-CM | POA: Diagnosis not present

## 2020-10-20 DIAGNOSIS — M05761 Rheumatoid arthritis with rheumatoid factor of right knee without organ or systems involvement: Secondary | ICD-10-CM | POA: Diagnosis not present

## 2020-10-20 DIAGNOSIS — G20 Parkinson's disease: Secondary | ICD-10-CM | POA: Diagnosis not present

## 2020-10-20 DIAGNOSIS — M170 Bilateral primary osteoarthritis of knee: Secondary | ICD-10-CM | POA: Diagnosis not present

## 2020-10-20 DIAGNOSIS — I129 Hypertensive chronic kidney disease with stage 1 through stage 4 chronic kidney disease, or unspecified chronic kidney disease: Secondary | ICD-10-CM | POA: Diagnosis not present

## 2020-10-20 DIAGNOSIS — N182 Chronic kidney disease, stage 2 (mild): Secondary | ICD-10-CM | POA: Diagnosis not present

## 2020-10-20 DIAGNOSIS — F32A Depression, unspecified: Secondary | ICD-10-CM | POA: Diagnosis not present

## 2020-10-21 DIAGNOSIS — S8391XA Sprain of unspecified site of right knee, initial encounter: Secondary | ICD-10-CM | POA: Diagnosis not present

## 2020-10-21 DIAGNOSIS — S83411A Sprain of medial collateral ligament of right knee, initial encounter: Secondary | ICD-10-CM | POA: Diagnosis not present

## 2020-10-21 DIAGNOSIS — S8991XA Unspecified injury of right lower leg, initial encounter: Secondary | ICD-10-CM | POA: Diagnosis not present

## 2020-10-22 ENCOUNTER — Ambulatory Visit: Payer: Medicare HMO | Admitting: Rheumatology

## 2020-10-22 DIAGNOSIS — Z79899 Other long term (current) drug therapy: Secondary | ICD-10-CM

## 2020-10-22 DIAGNOSIS — F32A Depression, unspecified: Secondary | ICD-10-CM | POA: Diagnosis not present

## 2020-10-22 DIAGNOSIS — I69351 Hemiplegia and hemiparesis following cerebral infarction affecting right dominant side: Secondary | ICD-10-CM | POA: Diagnosis not present

## 2020-10-22 DIAGNOSIS — Z8669 Personal history of other diseases of the nervous system and sense organs: Secondary | ICD-10-CM

## 2020-10-22 DIAGNOSIS — M0579 Rheumatoid arthritis with rheumatoid factor of multiple sites without organ or systems involvement: Secondary | ICD-10-CM

## 2020-10-22 DIAGNOSIS — Z8673 Personal history of transient ischemic attack (TIA), and cerebral infarction without residual deficits: Secondary | ICD-10-CM

## 2020-10-22 DIAGNOSIS — Z13820 Encounter for screening for osteoporosis: Secondary | ICD-10-CM

## 2020-10-22 DIAGNOSIS — M170 Bilateral primary osteoarthritis of knee: Secondary | ICD-10-CM | POA: Diagnosis not present

## 2020-10-22 DIAGNOSIS — M19071 Primary osteoarthritis, right ankle and foot: Secondary | ICD-10-CM

## 2020-10-22 DIAGNOSIS — M2241 Chondromalacia patellae, right knee: Secondary | ICD-10-CM

## 2020-10-22 DIAGNOSIS — K5903 Drug induced constipation: Secondary | ICD-10-CM | POA: Diagnosis not present

## 2020-10-22 DIAGNOSIS — M05761 Rheumatoid arthritis with rheumatoid factor of right knee without organ or systems involvement: Secondary | ICD-10-CM | POA: Diagnosis not present

## 2020-10-22 DIAGNOSIS — G20 Parkinson's disease: Secondary | ICD-10-CM | POA: Diagnosis not present

## 2020-10-22 DIAGNOSIS — Z8639 Personal history of other endocrine, nutritional and metabolic disease: Secondary | ICD-10-CM

## 2020-10-22 DIAGNOSIS — I129 Hypertensive chronic kidney disease with stage 1 through stage 4 chronic kidney disease, or unspecified chronic kidney disease: Secondary | ICD-10-CM | POA: Diagnosis not present

## 2020-10-22 DIAGNOSIS — N182 Chronic kidney disease, stage 2 (mild): Secondary | ICD-10-CM | POA: Diagnosis not present

## 2020-10-22 DIAGNOSIS — G894 Chronic pain syndrome: Secondary | ICD-10-CM | POA: Diagnosis not present

## 2020-10-22 DIAGNOSIS — M19041 Primary osteoarthritis, right hand: Secondary | ICD-10-CM

## 2020-10-26 DIAGNOSIS — G20 Parkinson's disease: Secondary | ICD-10-CM | POA: Diagnosis not present

## 2020-10-26 DIAGNOSIS — M05761 Rheumatoid arthritis with rheumatoid factor of right knee without organ or systems involvement: Secondary | ICD-10-CM | POA: Diagnosis not present

## 2020-10-26 DIAGNOSIS — G894 Chronic pain syndrome: Secondary | ICD-10-CM | POA: Diagnosis not present

## 2020-10-26 DIAGNOSIS — M170 Bilateral primary osteoarthritis of knee: Secondary | ICD-10-CM | POA: Diagnosis not present

## 2020-10-26 DIAGNOSIS — K5903 Drug induced constipation: Secondary | ICD-10-CM | POA: Diagnosis not present

## 2020-10-26 DIAGNOSIS — F32A Depression, unspecified: Secondary | ICD-10-CM | POA: Diagnosis not present

## 2020-10-26 DIAGNOSIS — N182 Chronic kidney disease, stage 2 (mild): Secondary | ICD-10-CM | POA: Diagnosis not present

## 2020-10-26 DIAGNOSIS — I129 Hypertensive chronic kidney disease with stage 1 through stage 4 chronic kidney disease, or unspecified chronic kidney disease: Secondary | ICD-10-CM | POA: Diagnosis not present

## 2020-10-26 DIAGNOSIS — I69351 Hemiplegia and hemiparesis following cerebral infarction affecting right dominant side: Secondary | ICD-10-CM | POA: Diagnosis not present

## 2020-10-27 DIAGNOSIS — N182 Chronic kidney disease, stage 2 (mild): Secondary | ICD-10-CM | POA: Diagnosis not present

## 2020-10-27 DIAGNOSIS — M170 Bilateral primary osteoarthritis of knee: Secondary | ICD-10-CM | POA: Diagnosis not present

## 2020-10-27 DIAGNOSIS — K5903 Drug induced constipation: Secondary | ICD-10-CM | POA: Diagnosis not present

## 2020-10-27 DIAGNOSIS — I129 Hypertensive chronic kidney disease with stage 1 through stage 4 chronic kidney disease, or unspecified chronic kidney disease: Secondary | ICD-10-CM | POA: Diagnosis not present

## 2020-10-27 DIAGNOSIS — G20 Parkinson's disease: Secondary | ICD-10-CM | POA: Diagnosis not present

## 2020-10-27 DIAGNOSIS — G894 Chronic pain syndrome: Secondary | ICD-10-CM | POA: Diagnosis not present

## 2020-10-27 DIAGNOSIS — M05761 Rheumatoid arthritis with rheumatoid factor of right knee without organ or systems involvement: Secondary | ICD-10-CM | POA: Diagnosis not present

## 2020-10-27 DIAGNOSIS — I69351 Hemiplegia and hemiparesis following cerebral infarction affecting right dominant side: Secondary | ICD-10-CM | POA: Diagnosis not present

## 2020-10-27 DIAGNOSIS — F32A Depression, unspecified: Secondary | ICD-10-CM | POA: Diagnosis not present

## 2020-10-29 DIAGNOSIS — I10 Essential (primary) hypertension: Secondary | ICD-10-CM | POA: Diagnosis not present

## 2020-10-29 DIAGNOSIS — M25569 Pain in unspecified knee: Secondary | ICD-10-CM | POA: Diagnosis not present

## 2020-10-29 DIAGNOSIS — F419 Anxiety disorder, unspecified: Secondary | ICD-10-CM | POA: Diagnosis not present

## 2020-11-01 DIAGNOSIS — G20 Parkinson's disease: Secondary | ICD-10-CM | POA: Diagnosis not present

## 2020-11-01 DIAGNOSIS — M170 Bilateral primary osteoarthritis of knee: Secondary | ICD-10-CM | POA: Diagnosis not present

## 2020-11-01 DIAGNOSIS — I69351 Hemiplegia and hemiparesis following cerebral infarction affecting right dominant side: Secondary | ICD-10-CM | POA: Diagnosis not present

## 2020-11-01 DIAGNOSIS — G894 Chronic pain syndrome: Secondary | ICD-10-CM | POA: Diagnosis not present

## 2020-11-01 DIAGNOSIS — K5903 Drug induced constipation: Secondary | ICD-10-CM | POA: Diagnosis not present

## 2020-11-01 DIAGNOSIS — N182 Chronic kidney disease, stage 2 (mild): Secondary | ICD-10-CM | POA: Diagnosis not present

## 2020-11-01 DIAGNOSIS — M05761 Rheumatoid arthritis with rheumatoid factor of right knee without organ or systems involvement: Secondary | ICD-10-CM | POA: Diagnosis not present

## 2020-11-01 DIAGNOSIS — I129 Hypertensive chronic kidney disease with stage 1 through stage 4 chronic kidney disease, or unspecified chronic kidney disease: Secondary | ICD-10-CM | POA: Diagnosis not present

## 2020-11-01 DIAGNOSIS — F32A Depression, unspecified: Secondary | ICD-10-CM | POA: Diagnosis not present

## 2020-11-05 DIAGNOSIS — F32A Depression, unspecified: Secondary | ICD-10-CM | POA: Diagnosis not present

## 2020-11-05 DIAGNOSIS — M05761 Rheumatoid arthritis with rheumatoid factor of right knee without organ or systems involvement: Secondary | ICD-10-CM | POA: Diagnosis not present

## 2020-11-05 DIAGNOSIS — N182 Chronic kidney disease, stage 2 (mild): Secondary | ICD-10-CM | POA: Diagnosis not present

## 2020-11-05 DIAGNOSIS — G20 Parkinson's disease: Secondary | ICD-10-CM | POA: Diagnosis not present

## 2020-11-05 DIAGNOSIS — I129 Hypertensive chronic kidney disease with stage 1 through stage 4 chronic kidney disease, or unspecified chronic kidney disease: Secondary | ICD-10-CM | POA: Diagnosis not present

## 2020-11-05 DIAGNOSIS — I69351 Hemiplegia and hemiparesis following cerebral infarction affecting right dominant side: Secondary | ICD-10-CM | POA: Diagnosis not present

## 2020-11-05 DIAGNOSIS — M170 Bilateral primary osteoarthritis of knee: Secondary | ICD-10-CM | POA: Diagnosis not present

## 2020-11-05 DIAGNOSIS — G894 Chronic pain syndrome: Secondary | ICD-10-CM | POA: Diagnosis not present

## 2020-11-05 DIAGNOSIS — K5903 Drug induced constipation: Secondary | ICD-10-CM | POA: Diagnosis not present

## 2020-11-11 DIAGNOSIS — N182 Chronic kidney disease, stage 2 (mild): Secondary | ICD-10-CM | POA: Diagnosis not present

## 2020-11-11 DIAGNOSIS — G20 Parkinson's disease: Secondary | ICD-10-CM | POA: Diagnosis not present

## 2020-11-11 DIAGNOSIS — G894 Chronic pain syndrome: Secondary | ICD-10-CM | POA: Diagnosis not present

## 2020-11-11 DIAGNOSIS — I69351 Hemiplegia and hemiparesis following cerebral infarction affecting right dominant side: Secondary | ICD-10-CM | POA: Diagnosis not present

## 2020-11-11 DIAGNOSIS — K5903 Drug induced constipation: Secondary | ICD-10-CM | POA: Diagnosis not present

## 2020-11-11 DIAGNOSIS — M170 Bilateral primary osteoarthritis of knee: Secondary | ICD-10-CM | POA: Diagnosis not present

## 2020-11-11 DIAGNOSIS — M05761 Rheumatoid arthritis with rheumatoid factor of right knee without organ or systems involvement: Secondary | ICD-10-CM | POA: Diagnosis not present

## 2020-11-11 DIAGNOSIS — F32A Depression, unspecified: Secondary | ICD-10-CM | POA: Diagnosis not present

## 2020-11-11 DIAGNOSIS — I129 Hypertensive chronic kidney disease with stage 1 through stage 4 chronic kidney disease, or unspecified chronic kidney disease: Secondary | ICD-10-CM | POA: Diagnosis not present

## 2020-11-13 DIAGNOSIS — M25559 Pain in unspecified hip: Secondary | ICD-10-CM | POA: Diagnosis not present

## 2020-11-13 DIAGNOSIS — M549 Dorsalgia, unspecified: Secondary | ICD-10-CM | POA: Diagnosis not present

## 2020-11-16 DIAGNOSIS — M25551 Pain in right hip: Secondary | ICD-10-CM | POA: Diagnosis not present

## 2020-11-16 DIAGNOSIS — M47816 Spondylosis without myelopathy or radiculopathy, lumbar region: Secondary | ICD-10-CM | POA: Diagnosis not present

## 2020-11-16 DIAGNOSIS — M25552 Pain in left hip: Secondary | ICD-10-CM | POA: Diagnosis not present

## 2020-11-17 DIAGNOSIS — F32A Depression, unspecified: Secondary | ICD-10-CM | POA: Diagnosis not present

## 2020-11-17 DIAGNOSIS — N182 Chronic kidney disease, stage 2 (mild): Secondary | ICD-10-CM | POA: Diagnosis not present

## 2020-11-17 DIAGNOSIS — G20 Parkinson's disease: Secondary | ICD-10-CM | POA: Diagnosis not present

## 2020-11-17 DIAGNOSIS — M170 Bilateral primary osteoarthritis of knee: Secondary | ICD-10-CM | POA: Diagnosis not present

## 2020-11-17 DIAGNOSIS — M05761 Rheumatoid arthritis with rheumatoid factor of right knee without organ or systems involvement: Secondary | ICD-10-CM | POA: Diagnosis not present

## 2020-11-17 DIAGNOSIS — I129 Hypertensive chronic kidney disease with stage 1 through stage 4 chronic kidney disease, or unspecified chronic kidney disease: Secondary | ICD-10-CM | POA: Diagnosis not present

## 2020-11-17 DIAGNOSIS — G894 Chronic pain syndrome: Secondary | ICD-10-CM | POA: Diagnosis not present

## 2020-11-17 DIAGNOSIS — K5903 Drug induced constipation: Secondary | ICD-10-CM | POA: Diagnosis not present

## 2020-11-17 DIAGNOSIS — I69351 Hemiplegia and hemiparesis following cerebral infarction affecting right dominant side: Secondary | ICD-10-CM | POA: Diagnosis not present

## 2020-11-19 ENCOUNTER — Telehealth: Payer: Self-pay

## 2020-11-19 DIAGNOSIS — Z79899 Other long term (current) drug therapy: Secondary | ICD-10-CM

## 2020-11-19 NOTE — Telephone Encounter (Signed)
Lab Orders released.  

## 2020-11-19 NOTE — Telephone Encounter (Signed)
Patient called requesting her labwork orders be sent to [facility] in [place].  Patient is going tomorrow, 11/20/20.

## 2020-11-20 DIAGNOSIS — Z79899 Other long term (current) drug therapy: Secondary | ICD-10-CM | POA: Diagnosis not present

## 2020-11-21 LAB — CMP14+EGFR
ALT: 9 IU/L (ref 0–32)
AST: 14 IU/L (ref 0–40)
Albumin/Globulin Ratio: 2.4 — ABNORMAL HIGH (ref 1.2–2.2)
Albumin: 3.9 g/dL (ref 3.7–4.7)
Alkaline Phosphatase: 152 IU/L — ABNORMAL HIGH (ref 44–121)
BUN/Creatinine Ratio: 26 (ref 12–28)
BUN: 17 mg/dL (ref 8–27)
Bilirubin Total: 0.2 mg/dL (ref 0.0–1.2)
CO2: 27 mmol/L (ref 20–29)
Calcium: 8.9 mg/dL (ref 8.7–10.3)
Chloride: 97 mmol/L (ref 96–106)
Creatinine, Ser: 0.66 mg/dL (ref 0.57–1.00)
Globulin, Total: 1.6 g/dL (ref 1.5–4.5)
Glucose: 99 mg/dL (ref 65–99)
Potassium: 4.8 mmol/L (ref 3.5–5.2)
Sodium: 136 mmol/L (ref 134–144)
Total Protein: 5.5 g/dL — ABNORMAL LOW (ref 6.0–8.5)
eGFR: 92 mL/min/{1.73_m2} (ref >59)

## 2020-11-21 LAB — CBC WITH DIFFERENTIAL/PLATELET
Basophils Absolute: 0.1 10*3/uL (ref 0.0–0.2)
Basos: 1 %
EOS (ABSOLUTE): 0.2 10*3/uL (ref 0.0–0.4)
Eos: 2 %
Hematocrit: 40 % (ref 34.0–46.6)
Hemoglobin: 13.7 g/dL (ref 11.1–15.9)
Immature Grans (Abs): 0.1 10*3/uL (ref 0.0–0.1)
Immature Granulocytes: 1 %
Lymphocytes Absolute: 2.4 10*3/uL (ref 0.7–3.1)
Lymphs: 28 %
MCH: 30 pg (ref 26.6–33.0)
MCHC: 34.3 g/dL (ref 31.5–35.7)
MCV: 88 fL (ref 79–97)
Monocytes Absolute: 0.6 10*3/uL (ref 0.1–0.9)
Monocytes: 8 %
Neutrophils Absolute: 5.2 10*3/uL (ref 1.4–7.0)
Neutrophils: 60 %
Platelets: 302 10*3/uL (ref 150–450)
RBC: 4.57 x10E6/uL (ref 3.77–5.28)
RDW: 14.3 % (ref 11.7–15.4)
WBC: 8.5 10*3/uL (ref 3.4–10.8)

## 2020-11-21 NOTE — Progress Notes (Signed)
CBC is normal, low protein and albumin noted due to chronic disease.  Alkaline phosphatase is elevated.  Repeat alkaline phosphatase and vitamin D in 1 month.  If it still elevated may consider total body bone scan.

## 2020-11-23 ENCOUNTER — Telehealth: Payer: Self-pay | Admitting: *Deleted

## 2020-11-23 DIAGNOSIS — R748 Abnormal levels of other serum enzymes: Secondary | ICD-10-CM

## 2020-11-23 NOTE — Telephone Encounter (Signed)
-----   Message from Bo Merino, MD sent at 11/21/2020  6:37 PM EDT ----- CBC is normal, low protein and albumin noted due to chronic disease.  Alkaline phosphatase is elevated.  Repeat alkaline phosphatase and vitamin D in 1 month.  If it still elevated may consider total body bone scan.

## 2020-11-24 DIAGNOSIS — N182 Chronic kidney disease, stage 2 (mild): Secondary | ICD-10-CM | POA: Diagnosis not present

## 2020-11-24 DIAGNOSIS — K5903 Drug induced constipation: Secondary | ICD-10-CM | POA: Diagnosis not present

## 2020-11-24 DIAGNOSIS — I69351 Hemiplegia and hemiparesis following cerebral infarction affecting right dominant side: Secondary | ICD-10-CM | POA: Diagnosis not present

## 2020-11-24 DIAGNOSIS — I129 Hypertensive chronic kidney disease with stage 1 through stage 4 chronic kidney disease, or unspecified chronic kidney disease: Secondary | ICD-10-CM | POA: Diagnosis not present

## 2020-11-24 DIAGNOSIS — M170 Bilateral primary osteoarthritis of knee: Secondary | ICD-10-CM | POA: Diagnosis not present

## 2020-11-24 DIAGNOSIS — F32A Depression, unspecified: Secondary | ICD-10-CM | POA: Diagnosis not present

## 2020-11-24 DIAGNOSIS — G894 Chronic pain syndrome: Secondary | ICD-10-CM | POA: Diagnosis not present

## 2020-11-24 DIAGNOSIS — G20 Parkinson's disease: Secondary | ICD-10-CM | POA: Diagnosis not present

## 2020-11-24 DIAGNOSIS — M05761 Rheumatoid arthritis with rheumatoid factor of right knee without organ or systems involvement: Secondary | ICD-10-CM | POA: Diagnosis not present

## 2020-11-26 DIAGNOSIS — K5903 Drug induced constipation: Secondary | ICD-10-CM | POA: Diagnosis not present

## 2020-11-26 DIAGNOSIS — M05761 Rheumatoid arthritis with rheumatoid factor of right knee without organ or systems involvement: Secondary | ICD-10-CM | POA: Diagnosis not present

## 2020-11-26 DIAGNOSIS — M170 Bilateral primary osteoarthritis of knee: Secondary | ICD-10-CM | POA: Diagnosis not present

## 2020-11-26 DIAGNOSIS — N182 Chronic kidney disease, stage 2 (mild): Secondary | ICD-10-CM | POA: Diagnosis not present

## 2020-11-26 DIAGNOSIS — G894 Chronic pain syndrome: Secondary | ICD-10-CM | POA: Diagnosis not present

## 2020-11-26 DIAGNOSIS — F32A Depression, unspecified: Secondary | ICD-10-CM | POA: Diagnosis not present

## 2020-11-26 DIAGNOSIS — G20 Parkinson's disease: Secondary | ICD-10-CM | POA: Diagnosis not present

## 2020-11-26 DIAGNOSIS — I69351 Hemiplegia and hemiparesis following cerebral infarction affecting right dominant side: Secondary | ICD-10-CM | POA: Diagnosis not present

## 2020-11-26 DIAGNOSIS — I129 Hypertensive chronic kidney disease with stage 1 through stage 4 chronic kidney disease, or unspecified chronic kidney disease: Secondary | ICD-10-CM | POA: Diagnosis not present

## 2020-11-27 DIAGNOSIS — M25559 Pain in unspecified hip: Secondary | ICD-10-CM | POA: Diagnosis not present

## 2020-11-27 DIAGNOSIS — M25569 Pain in unspecified knee: Secondary | ICD-10-CM | POA: Diagnosis not present

## 2020-11-27 DIAGNOSIS — M48061 Spinal stenosis, lumbar region without neurogenic claudication: Secondary | ICD-10-CM | POA: Diagnosis not present

## 2020-11-27 DIAGNOSIS — F419 Anxiety disorder, unspecified: Secondary | ICD-10-CM | POA: Diagnosis not present

## 2020-11-27 DIAGNOSIS — I10 Essential (primary) hypertension: Secondary | ICD-10-CM | POA: Diagnosis not present

## 2020-11-27 DIAGNOSIS — E871 Hypo-osmolality and hyponatremia: Secondary | ICD-10-CM | POA: Diagnosis not present

## 2020-11-30 DIAGNOSIS — I129 Hypertensive chronic kidney disease with stage 1 through stage 4 chronic kidney disease, or unspecified chronic kidney disease: Secondary | ICD-10-CM | POA: Diagnosis not present

## 2020-11-30 DIAGNOSIS — F32A Depression, unspecified: Secondary | ICD-10-CM | POA: Diagnosis not present

## 2020-11-30 DIAGNOSIS — M05761 Rheumatoid arthritis with rheumatoid factor of right knee without organ or systems involvement: Secondary | ICD-10-CM | POA: Diagnosis not present

## 2020-11-30 DIAGNOSIS — N182 Chronic kidney disease, stage 2 (mild): Secondary | ICD-10-CM | POA: Diagnosis not present

## 2020-11-30 DIAGNOSIS — I69351 Hemiplegia and hemiparesis following cerebral infarction affecting right dominant side: Secondary | ICD-10-CM | POA: Diagnosis not present

## 2020-11-30 DIAGNOSIS — K5903 Drug induced constipation: Secondary | ICD-10-CM | POA: Diagnosis not present

## 2020-11-30 DIAGNOSIS — M170 Bilateral primary osteoarthritis of knee: Secondary | ICD-10-CM | POA: Diagnosis not present

## 2020-11-30 DIAGNOSIS — G20 Parkinson's disease: Secondary | ICD-10-CM | POA: Diagnosis not present

## 2020-11-30 DIAGNOSIS — G894 Chronic pain syndrome: Secondary | ICD-10-CM | POA: Diagnosis not present

## 2020-12-06 DIAGNOSIS — K625 Hemorrhage of anus and rectum: Secondary | ICD-10-CM | POA: Diagnosis not present

## 2020-12-14 ENCOUNTER — Other Ambulatory Visit: Payer: Self-pay | Admitting: Physician Assistant

## 2020-12-14 DIAGNOSIS — M0579 Rheumatoid arthritis with rheumatoid factor of multiple sites without organ or systems involvement: Secondary | ICD-10-CM

## 2020-12-14 NOTE — Telephone Encounter (Signed)
Next Visit: 01/14/2021  Last Visit: 05/21/2020  Last Fill: 10/16/2020  DX:  Rheumatoid arthritis involving multiple sites with positive rheumatoid factor   Current Dose per office note 05/21/2020: Plaquenil 200 mg 1 tablet by mouth twice daily Monday through Friday only  Labs: 11/21/2020 CBC is normal, low protein and albumin noted due to chronic disease.  Alkaline phosphatase is elevated.  Repeat alkaline phosphatase and vitamin D in 1 month.  If it still elevated may consider total body bone scan.  Okay to refill PLQ?

## 2020-12-18 ENCOUNTER — Telehealth: Payer: Self-pay

## 2020-12-18 DIAGNOSIS — R748 Abnormal levels of other serum enzymes: Secondary | ICD-10-CM

## 2020-12-18 NOTE — Telephone Encounter (Signed)
Lab Orders released.  

## 2020-12-18 NOTE — Telephone Encounter (Signed)
Patient called requesting labwork orders be sent to [facility] in [place].  Patient states she plans to go on Tuesday, 12/22/20.

## 2020-12-22 DIAGNOSIS — Z0000 Encounter for general adult medical examination without abnormal findings: Secondary | ICD-10-CM | POA: Diagnosis not present

## 2020-12-22 DIAGNOSIS — Z9181 History of falling: Secondary | ICD-10-CM | POA: Diagnosis not present

## 2020-12-22 DIAGNOSIS — M170 Bilateral primary osteoarthritis of knee: Secondary | ICD-10-CM | POA: Diagnosis not present

## 2020-12-22 DIAGNOSIS — Z1331 Encounter for screening for depression: Secondary | ICD-10-CM | POA: Diagnosis not present

## 2020-12-22 DIAGNOSIS — R748 Abnormal levels of other serum enzymes: Secondary | ICD-10-CM | POA: Diagnosis not present

## 2020-12-22 DIAGNOSIS — M0579 Rheumatoid arthritis with rheumatoid factor of multiple sites without organ or systems involvement: Secondary | ICD-10-CM | POA: Diagnosis not present

## 2020-12-22 DIAGNOSIS — Z79899 Other long term (current) drug therapy: Secondary | ICD-10-CM | POA: Diagnosis not present

## 2020-12-22 DIAGNOSIS — M1909 Primary osteoarthritis, other specified site: Secondary | ICD-10-CM | POA: Diagnosis not present

## 2020-12-22 DIAGNOSIS — Z139 Encounter for screening, unspecified: Secondary | ICD-10-CM | POA: Diagnosis not present

## 2020-12-22 DIAGNOSIS — E785 Hyperlipidemia, unspecified: Secondary | ICD-10-CM | POA: Diagnosis not present

## 2020-12-23 ENCOUNTER — Telehealth: Payer: Self-pay | Admitting: *Deleted

## 2020-12-23 DIAGNOSIS — R748 Abnormal levels of other serum enzymes: Secondary | ICD-10-CM

## 2020-12-23 LAB — VITAMIN D 25 HYDROXY (VIT D DEFICIENCY, FRACTURES): Vit D, 25-Hydroxy: 41.4 ng/mL (ref 30.0–100.0)

## 2020-12-23 LAB — ALKALINE PHOSPHATASE: Alkaline Phosphatase: 139 IU/L — ABNORMAL HIGH (ref 44–121)

## 2020-12-23 NOTE — Progress Notes (Signed)
Vitamin D is normal.  Alkaline phosphatase is elevated but improved.  We will recheck alkaline phosphatase in 6 months.

## 2020-12-23 NOTE — Telephone Encounter (Signed)
-----   Message from Bo Merino, MD sent at 12/23/2020  8:49 AM EDT ----- Vitamin D is normal.  Alkaline phosphatase is elevated but improved.  We will recheck alkaline phosphatase in 6 months.

## 2020-12-28 DIAGNOSIS — M545 Low back pain, unspecified: Secondary | ICD-10-CM | POA: Diagnosis not present

## 2020-12-30 DIAGNOSIS — H25813 Combined forms of age-related cataract, bilateral: Secondary | ICD-10-CM | POA: Diagnosis not present

## 2020-12-30 DIAGNOSIS — H52223 Regular astigmatism, bilateral: Secondary | ICD-10-CM | POA: Diagnosis not present

## 2020-12-30 DIAGNOSIS — H40003 Preglaucoma, unspecified, bilateral: Secondary | ICD-10-CM | POA: Diagnosis not present

## 2020-12-30 DIAGNOSIS — H524 Presbyopia: Secondary | ICD-10-CM | POA: Diagnosis not present

## 2020-12-30 DIAGNOSIS — H47233 Glaucomatous optic atrophy, bilateral: Secondary | ICD-10-CM | POA: Diagnosis not present

## 2020-12-30 DIAGNOSIS — H5203 Hypermetropia, bilateral: Secondary | ICD-10-CM | POA: Diagnosis not present

## 2020-12-30 DIAGNOSIS — H40013 Open angle with borderline findings, low risk, bilateral: Secondary | ICD-10-CM | POA: Diagnosis not present

## 2020-12-31 NOTE — Progress Notes (Signed)
Office Visit Note  Patient: Dawn Harrell             Date of Birth: 05-Jun-1945           MRN: [id number]             PCP: Lowella Dandy, NP Referring: Lowella Dandy, NP Visit Date: 01/14/2021 Occupation: '@GUAROCC'$ @  Subjective:  Medication management.   History of Present Illness: Dawn Harrell is a 75 y.o. female with a history of seropositive rheumatoid arthritis.  She states she has been taking hydroxychloroquine on a regular basis.  She denies any joint pain or joint swelling.  She has been tolerating hydroxychloroquine without any side effects.  She will get eye examination soon.  She states that she is a scheduled to get DEXA scan in February 2023.  Activities of Daily Living:  Patient reports morning stiffness for 30 minutes.   Patient Reports nocturnal pain.  Difficulty dressing/grooming: Denies Difficulty climbing stairs: Reports Difficulty getting out of chair: Reports Difficulty using hands for taps, buttons, cutlery, and/or writing: Reports  Review of Systems  Constitutional:  Positive for fatigue.  HENT:  Negative for mouth sores, mouth dryness and nose dryness.   Eyes:  Negative for pain, itching and dryness.  Respiratory:  Negative for shortness of breath and difficulty breathing.   Cardiovascular:  Negative for chest pain and palpitations.  Gastrointestinal:  Positive for constipation. Negative for blood in stool and diarrhea.  Endocrine: Negative for increased urination.  Genitourinary:  Negative for difficulty urinating.  Musculoskeletal:  Positive for morning stiffness. Negative for joint pain, joint pain, joint swelling, myalgias, muscle tenderness and myalgias.  Skin:  Negative for color change, rash and redness.  Allergic/Immunologic: Negative for susceptible to infections.  Neurological:  Positive for weakness. Negative for dizziness, numbness, headaches and memory loss.  Hematological:  Positive for bruising/bleeding tendency.  Psychiatric/Behavioral:   Positive for confusion.    PMFS History:  Patient Active Problem List   Diagnosis Date Noted   Chondromalacia of both patellae 10/04/2016   History of tremor/ Parkinsons dx.  10/04/2016   History of TIA (transient ischemic attack) 10/04/2016   History of hyperlipidemia 10/04/2016   Tremor 03/24/2016   Rheumatoid arthritis involving multiple sites with positive rheumatoid factor ([place]) 03/24/2016   Primary osteoarthritis of both hands 03/24/2016   Primary osteoarthritis of both knees 03/24/2016   Primary osteoarthritis of both feet 03/24/2016   High risk medication use 03/24/2016    Past Medical History:  Diagnosis Date   Arthritis    Parkinson's disease ([place])    skin cancer     Family History  Problem Relation Age of Onset   Stroke Mother    Heart attack Father    Rheum arthritis Sister    Lung disease Brother    Diabetes Maternal Grandmother    Diabetes Paternal Grandmother    Leukemia Sister    Past Surgical History:  Procedure Laterality Date   ABDOMINAL HYSTERECTOMY     bladder tack     FOOT SURGERY     SKIN CANCER EXCISION  11/2015   SQUAMOUS CELL CARCINOMA EXCISION  03/2018   right arm   Social History   Social History Narrative   Not on file   Immunization History  Administered Date(s) Administered   Moderna Sars-Covid-2 Vaccination 05/23/2019, 06/20/2019     Objective: Vital Signs: BP 138/86 (BP Location: Left Arm, Patient Position: Sitting, Cuff Size: Normal)   Pulse 65   Ht 5' (1.524  m)   Wt 139 lb (63 kg)   BMI 27.15 kg/m    Physical Exam Vitals and nursing note reviewed.  Constitutional:      Appearance: She is well-developed.  HENT:     Head: Normocephalic and atraumatic.  Eyes:     Conjunctiva/sclera: Conjunctivae normal.  Cardiovascular:     Rate and Rhythm: Normal rate and regular rhythm.     Heart sounds: Normal heart sounds.  Pulmonary:     Effort: Pulmonary effort is normal.     Breath sounds: Normal breath sounds.  Abdominal:      General: Bowel sounds are normal.     Palpations: Abdomen is soft.  Musculoskeletal:     Cervical back: Normal range of motion.  Lymphadenopathy:     Cervical: No cervical adenopathy.  Skin:    General: Skin is warm and dry.     Capillary Refill: Capillary refill takes less than 2 seconds.  Neurological:     Mental Status: She is alert and oriented to person, place, and time.  Psychiatric:        Behavior: Behavior normal.     Musculoskeletal Exam: C-spine was in good range of motion.  Shoulder joints, elbow joints, wrist joints with good range of motion.  She had bilateral CMC PIP and DIP thickening with no synovitis.  Hip joints were difficult to assess in the sitting position.  Knee joints with good range of motion with no warmth swelling or effusion.  There was no tenderness over ankles or MTPs.  CDAI Exam: CDAI Score: 0  Patient Global: 0 mm; Provider Global: 0 mm Swollen: 0 ; Tender: 0  Joint Exam 01/14/2021   No joint exam has been documented for this visit   There is currently no information documented on the homunculus. Go to the Rheumatology activity and complete the homunculus joint exam.  Investigation: No additional findings.  Imaging: No results found.  Recent Labs: Lab Results  Component Value Date   WBC 8.5 11/20/2020   HGB 13.7 11/20/2020   PLT 302 11/20/2020   NA 136 11/20/2020   K 4.8 11/20/2020   CL 97 11/20/2020   CO2 27 11/20/2020   GLUCOSE 99 11/20/2020   BUN 17 11/20/2020   CREATININE 0.66 11/20/2020   BILITOT 0.2 11/20/2020   ALKPHOS 139 (H) 12/22/2020   AST 14 11/20/2020   ALT 9 11/20/2020   PROT 5.5 (L) 11/20/2020   ALBUMIN 3.9 11/20/2020   CALCIUM 8.9 11/20/2020   GFRAA 90 05/21/2020   QFTBGOLDPLUS NEGATIVE 01/23/2018    Speciality Comments: PLQ Eye Exam: 12/30/2019 WNL @ [facility] up in 1 year  Immunosupressant Lab Work:  TB gold-negative 09/04/13  Hepatitis panel- negative except Hep A IgM antibody  09/04/13  Procedures:  No procedures performed Allergies: Hydrocodone-acetaminophen and Gabapentin   Assessment / Plan:     Visit Diagnoses: Rheumatoid arthritis involving multiple sites with positive rheumatoid factor ([place]) - X-rays of both hands and both feet were obtained on 12/19/2019 and did not show any radiographic progression since 2018.  She is doing well on hydroxychloroquine.  She denies any joint pain or joint swelling.  High risk medication use - Plaquenil 200 mg 1 tablet by mouth twice daily Monday through Friday only. PLQ Eye Exam: 12/30/2019.  Her labs from August 2022 for reviewed which were within normal limits.  Alkaline phosphatase was mildly increased but improving.  She was advised to get eye examination this year.  Primary osteoarthritis of  both hands-joint protection muscle strengthening was discussed  Primary osteoarthritis of both knees.  She continues to have discomfort in her knee joints due to osteoarthritis.  Chondromalacia of both patellae-she had no warmth swelling or effusion.  Primary osteoarthritis of both feet-she denies any discomfort today.  History of TIA (transient ischemic attack)  History of tremor/ Parkinsons dx.   History of hyperlipidemia  Osteoporosis screening - No DEXA in epic.  Patient states that she will discuss with her PCP.  Use of calcium rich diet with vitamin D and resistive exercises were discussed.  Orders: No orders of the defined types were placed in this encounter.  No orders of the defined types were placed in this encounter.    Follow-Up Instructions: Return in about 5 months (around 06/15/2021) for Rheumatoid arthritis.   Bo Merino, MD  Note - This record has been created using Editor, commissioning.  Chart creation errors have been sought, but may not always  have been located. Such creation errors do not reflect on  the standard of medical care.

## 2021-01-01 DIAGNOSIS — M170 Bilateral primary osteoarthritis of knee: Secondary | ICD-10-CM | POA: Diagnosis not present

## 2021-01-01 DIAGNOSIS — T402X5A Adverse effect of other opioids, initial encounter: Secondary | ICD-10-CM | POA: Diagnosis not present

## 2021-01-01 DIAGNOSIS — G20 Parkinson's disease: Secondary | ICD-10-CM | POA: Diagnosis not present

## 2021-01-01 DIAGNOSIS — I10 Essential (primary) hypertension: Secondary | ICD-10-CM | POA: Diagnosis not present

## 2021-01-01 DIAGNOSIS — M549 Dorsalgia, unspecified: Secondary | ICD-10-CM | POA: Diagnosis not present

## 2021-01-01 DIAGNOSIS — K5903 Drug induced constipation: Secondary | ICD-10-CM | POA: Diagnosis not present

## 2021-01-12 DIAGNOSIS — I10 Essential (primary) hypertension: Secondary | ICD-10-CM | POA: Diagnosis not present

## 2021-01-12 DIAGNOSIS — E785 Hyperlipidemia, unspecified: Secondary | ICD-10-CM | POA: Diagnosis not present

## 2021-01-13 DIAGNOSIS — H47233 Glaucomatous optic atrophy, bilateral: Secondary | ICD-10-CM | POA: Diagnosis not present

## 2021-01-13 DIAGNOSIS — H40013 Open angle with borderline findings, low risk, bilateral: Secondary | ICD-10-CM | POA: Diagnosis not present

## 2021-01-14 ENCOUNTER — Other Ambulatory Visit: Payer: Self-pay

## 2021-01-14 ENCOUNTER — Ambulatory Visit: Payer: Medicare HMO | Admitting: Rheumatology

## 2021-01-14 ENCOUNTER — Encounter: Payer: Self-pay | Admitting: Rheumatology

## 2021-01-14 VITALS — BP 138/86 | HR 65 | Ht 60.0 in | Wt 139.0 lb

## 2021-01-14 DIAGNOSIS — Z8669 Personal history of other diseases of the nervous system and sense organs: Secondary | ICD-10-CM | POA: Diagnosis not present

## 2021-01-14 DIAGNOSIS — M2241 Chondromalacia patellae, right knee: Secondary | ICD-10-CM | POA: Diagnosis not present

## 2021-01-14 DIAGNOSIS — Z8639 Personal history of other endocrine, nutritional and metabolic disease: Secondary | ICD-10-CM | POA: Diagnosis not present

## 2021-01-14 DIAGNOSIS — Z79899 Other long term (current) drug therapy: Secondary | ICD-10-CM | POA: Diagnosis not present

## 2021-01-14 DIAGNOSIS — M170 Bilateral primary osteoarthritis of knee: Secondary | ICD-10-CM | POA: Diagnosis not present

## 2021-01-14 DIAGNOSIS — M19041 Primary osteoarthritis, right hand: Secondary | ICD-10-CM | POA: Diagnosis not present

## 2021-01-14 DIAGNOSIS — M2242 Chondromalacia patellae, left knee: Secondary | ICD-10-CM

## 2021-01-14 DIAGNOSIS — M0579 Rheumatoid arthritis with rheumatoid factor of multiple sites without organ or systems involvement: Secondary | ICD-10-CM

## 2021-01-14 DIAGNOSIS — Z8673 Personal history of transient ischemic attack (TIA), and cerebral infarction without residual deficits: Secondary | ICD-10-CM | POA: Diagnosis not present

## 2021-01-14 DIAGNOSIS — M19071 Primary osteoarthritis, right ankle and foot: Secondary | ICD-10-CM | POA: Diagnosis not present

## 2021-01-14 DIAGNOSIS — Z13820 Encounter for screening for osteoporosis: Secondary | ICD-10-CM

## 2021-01-14 DIAGNOSIS — M19042 Primary osteoarthritis, left hand: Secondary | ICD-10-CM

## 2021-01-14 DIAGNOSIS — M19072 Primary osteoarthritis, left ankle and foot: Secondary | ICD-10-CM

## 2021-01-14 NOTE — Patient Instructions (Signed)
Standing Labs We placed an order today for your standing lab work.   Please have your standing labs drawn in January   If possible, please have your labs drawn 2 weeks prior to your appointment so that the provider can discuss your results at your appointment.  Please note that you may see your imaging and lab results in [place] before we have reviewed them. We may be awaiting multiple results to interpret others before contacting you. Please allow our office up to 72 hours to thoroughly review all of the results before contacting the office for clarification of your results.  We have open lab daily: Monday through Thursday from 1:30-4:30 PM and Friday from 1:30-4:00 PM at the office of Dr. Bo Merino, [facility].   Please be advised, all patients with office appointments requiring lab work will take precedent over walk-in lab work.  If possible, please come for your lab work on Monday and Friday afternoons, as you may experience shorter wait times. The office is located at [address], [place], [place], [place] [address] No appointment is necessary.   Labs are drawn by [facility]. Please bring your co-pay at the time of your lab draw.  You may receive a bill from [facility] for your lab work.  If you wish to have your labs drawn at another location, please call the office 24 hours in advance to send orders.  If you have any questions regarding directions or hours of operation,  please call [phone].   As a reminder, please drink plenty of water prior to coming for your lab work. Thanks!   Vaccines You are taking a medication(s) that can suppress your immune system.  The following immunizations are recommended: Flu annually Covid-19  Td/Tdap (tetanus, diphtheria, pertussis) every 10 years Pneumonia (Prevnar 15 then Pneumovax 23 at least 1 year apart.  Alternatively, can take Prevnar 20 without needing additional dose) Shingrix: 2 doses from 4 weeks to 6 months  apart  Please check with your PCP to make sure you are up to date.

## 2021-01-25 DIAGNOSIS — N83209 Unspecified ovarian cyst, unspecified side: Secondary | ICD-10-CM | POA: Diagnosis not present

## 2021-01-25 DIAGNOSIS — D3911 Neoplasm of uncertain behavior of right ovary: Secondary | ICD-10-CM | POA: Diagnosis not present

## 2021-01-25 DIAGNOSIS — N83201 Unspecified ovarian cyst, right side: Secondary | ICD-10-CM | POA: Diagnosis not present

## 2021-01-26 DIAGNOSIS — G4722 Circadian rhythm sleep disorder, advanced sleep phase type: Secondary | ICD-10-CM | POA: Diagnosis not present

## 2021-01-26 DIAGNOSIS — G20 Parkinson's disease: Secondary | ICD-10-CM | POA: Diagnosis not present

## 2021-02-04 ENCOUNTER — Encounter: Payer: Self-pay | Admitting: Neurology
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
## 2021-02-22 ENCOUNTER — Ambulatory Visit: Payer: Medicare HMO | Admitting: Neurology

## 2021-02-22 ENCOUNTER — Encounter: Payer: Self-pay | Admitting: Neurology

## 2021-02-22 ENCOUNTER — Other Ambulatory Visit: Payer: Self-pay

## 2021-02-22 VITALS — BP 126/71 | HR 97 | Ht 61.0 in | Wt 134.4 lb

## 2021-02-22 DIAGNOSIS — G249 Dystonia, unspecified: Secondary | ICD-10-CM

## 2021-02-22 DIAGNOSIS — G20 Parkinson's disease: Secondary | ICD-10-CM

## 2021-02-22 MED ORDER — AMANTADINE HCL 100 MG PO CAPS: 100.0000 mg | ORAL_CAPSULE | Freq: Two times a day (BID) | ORAL | 1 refills | Status: DC

## 2021-02-22 MED ORDER — CARBIDOPA-LEVODOPA ER 50-200 MG PO TBCR: 1.0000 | EXTENDED_RELEASE_TABLET | Freq: Every day | ORAL | 1 refills | Status: DC

## 2021-02-22 MED ORDER — CARBIDOPA-LEVODOPA 25-100 MG PO TABS: ORAL_TABLET | ORAL | 1 refills | Status: DC

## 2021-02-22 NOTE — Patient Instructions (Signed)
Take carbidopa/levodopa 25/100, 1 tablet at 9am/1pm/5pm.   As a reminder, carbidopa/levodopa can be taken at the same time as a carbohydrate, but we like to have you take your pill either 30 minutes before a protein source or 1 hour after as protein can interfere with carbidopa/levodopa absorption. ADD carbidopa/levodopa 50/200 CR at bedtime Take amantadine, 1 capsule at 9am and 1pm

## 2021-02-26 DIAGNOSIS — K5903 Drug induced constipation: Secondary | ICD-10-CM | POA: Diagnosis not present

## 2021-02-26 DIAGNOSIS — T402X5A Adverse effect of other opioids, initial encounter: Secondary | ICD-10-CM | POA: Diagnosis not present

## 2021-02-26 DIAGNOSIS — G20 Parkinson's disease: Secondary | ICD-10-CM | POA: Diagnosis not present

## 2021-02-26 DIAGNOSIS — M549 Dorsalgia, unspecified: Secondary | ICD-10-CM | POA: Diagnosis not present

## 2021-02-26 DIAGNOSIS — I10 Essential (primary) hypertension: Secondary | ICD-10-CM | POA: Diagnosis not present

## 2021-02-26 DIAGNOSIS — M170 Bilateral primary osteoarthritis of knee: Secondary | ICD-10-CM | POA: Diagnosis not present

## 2021-03-17 DIAGNOSIS — E785 Hyperlipidemia, unspecified: Secondary | ICD-10-CM | POA: Diagnosis not present

## 2021-03-17 DIAGNOSIS — I10 Essential (primary) hypertension: Secondary | ICD-10-CM | POA: Diagnosis not present

## 2021-04-02 DIAGNOSIS — R2681 Unsteadiness on feet: Secondary | ICD-10-CM | POA: Diagnosis not present

## 2021-04-02 DIAGNOSIS — M549 Dorsalgia, unspecified: Secondary | ICD-10-CM | POA: Diagnosis not present

## 2021-04-02 DIAGNOSIS — R531 Weakness: Secondary | ICD-10-CM | POA: Diagnosis not present

## 2021-04-26 DIAGNOSIS — T402X5A Adverse effect of other opioids, initial encounter: Secondary | ICD-10-CM | POA: Diagnosis not present

## 2021-04-26 DIAGNOSIS — I10 Essential (primary) hypertension: Secondary | ICD-10-CM | POA: Diagnosis not present

## 2021-04-26 DIAGNOSIS — G20 Parkinson's disease: Secondary | ICD-10-CM | POA: Diagnosis not present

## 2021-04-26 DIAGNOSIS — M549 Dorsalgia, unspecified: Secondary | ICD-10-CM | POA: Diagnosis not present

## 2021-04-26 DIAGNOSIS — Z6824 Body mass index (BMI) 24.0-24.9, adult: Secondary | ICD-10-CM | POA: Diagnosis not present

## 2021-04-26 DIAGNOSIS — M05761 Rheumatoid arthritis with rheumatoid factor of right knee without organ or systems involvement: Secondary | ICD-10-CM | POA: Diagnosis not present

## 2021-04-26 DIAGNOSIS — K5903 Drug induced constipation: Secondary | ICD-10-CM | POA: Diagnosis not present

## 2021-04-29 DIAGNOSIS — M47816 Spondylosis without myelopathy or radiculopathy, lumbar region: Secondary | ICD-10-CM | POA: Diagnosis not present

## 2021-04-29 DIAGNOSIS — M47896 Other spondylosis, lumbar region: Secondary | ICD-10-CM | POA: Diagnosis not present

## 2021-05-17 DIAGNOSIS — M5416 Radiculopathy, lumbar region: Secondary | ICD-10-CM | POA: Diagnosis not present

## 2021-05-17 DIAGNOSIS — M47896 Other spondylosis, lumbar region: Secondary | ICD-10-CM | POA: Diagnosis not present

## 2021-05-17 DIAGNOSIS — M5136 Other intervertebral disc degeneration, lumbar region: Secondary | ICD-10-CM | POA: Diagnosis not present

## 2021-05-18 DIAGNOSIS — E785 Hyperlipidemia, unspecified: Secondary | ICD-10-CM | POA: Diagnosis not present

## 2021-05-18 DIAGNOSIS — I10 Essential (primary) hypertension: Secondary | ICD-10-CM | POA: Diagnosis not present

## 2021-05-21 DIAGNOSIS — Z1231 Encounter for screening mammogram for malignant neoplasm of breast: Secondary | ICD-10-CM | POA: Diagnosis not present

## 2021-05-21 DIAGNOSIS — J069 Acute upper respiratory infection, unspecified: Secondary | ICD-10-CM | POA: Diagnosis not present

## 2021-05-27 DIAGNOSIS — M05761 Rheumatoid arthritis with rheumatoid factor of right knee without organ or systems involvement: Secondary | ICD-10-CM | POA: Diagnosis not present

## 2021-05-27 DIAGNOSIS — I10 Essential (primary) hypertension: Secondary | ICD-10-CM | POA: Diagnosis not present

## 2021-05-27 DIAGNOSIS — K5903 Drug induced constipation: Secondary | ICD-10-CM | POA: Diagnosis not present

## 2021-05-27 DIAGNOSIS — M549 Dorsalgia, unspecified: Secondary | ICD-10-CM | POA: Diagnosis not present

## 2021-05-27 DIAGNOSIS — M810 Age-related osteoporosis without current pathological fracture: Secondary | ICD-10-CM | POA: Diagnosis not present

## 2021-05-27 DIAGNOSIS — Z6824 Body mass index (BMI) 24.0-24.9, adult: Secondary | ICD-10-CM | POA: Diagnosis not present

## 2021-05-27 DIAGNOSIS — M85832 Other specified disorders of bone density and structure, left forearm: Secondary | ICD-10-CM | POA: Diagnosis not present

## 2021-05-27 DIAGNOSIS — N959 Unspecified menopausal and perimenopausal disorder: Secondary | ICD-10-CM | POA: Diagnosis not present

## 2021-05-27 DIAGNOSIS — T402X5A Adverse effect of other opioids, initial encounter: Secondary | ICD-10-CM | POA: Diagnosis not present

## 2021-05-27 DIAGNOSIS — G20 Parkinson's disease: Secondary | ICD-10-CM | POA: Diagnosis not present

## 2021-06-03 NOTE — Progress Notes (Signed)
Office Visit Note  Patient: Dawn Harrell             Date of Birth: 12-11-1945           MRN: [id number]             PCP: Lowella Dandy, NP Referring: Lowella Dandy, NP Visit Date: 06/16/2021 Occupation: @GUAROCC @  Subjective:  Medication monitoring   History of Present Illness: Dawn Harrell is a 76 y.o. female with history of seropositive rheumatoid arthritis and osteoarthritis.  She is taking Plaquenil 200 mg 1 tablet by mouth twice daily Monday through Friday.  She is tolerating Plaquenil without any side effects and has not missed any doses recently.  She denies any signs or symptoms of a rheumatoid arthritis flare.  She is not having any increased pain in her hands or feet at this time.  She denies any joint swelling.  She states she continues to have some right-sided lower back pain and is followed closely by Dr. Herma Mering.  She had an injection in her lower back about 2 weeks ago.  She continues to use a rollator walker to assist with ambulation.  She has been riding a stationary bike on a daily basis at least 5 miles daily.    Activities of Daily Living:  Patient reports morning stiffness for 0 minutes.   Patient Denies nocturnal pain.  Difficulty dressing/grooming: Denies Difficulty climbing stairs: Reports Difficulty getting out of chair: Denies Difficulty using hands for taps, buttons, cutlery, and/or writing: Reports  Review of Systems  Constitutional:  Positive for fatigue.  HENT:  Negative for mouth sores, mouth dryness and nose dryness.   Eyes:  Negative for pain, itching and dryness.  Respiratory:  Negative for shortness of breath and difficulty breathing.   Cardiovascular:  Negative for chest pain and palpitations.  Gastrointestinal:  Negative for blood in stool, constipation and diarrhea.  Endocrine: Negative for increased urination.  Genitourinary:  Negative for difficulty urinating.  Musculoskeletal:  Positive for myalgias, muscle tenderness and myalgias. Negative  for joint pain, joint pain, joint swelling and morning stiffness.  Skin:  Negative for color change, rash and redness.  Allergic/Immunologic: Negative for susceptible to infections.  Neurological:  Negative for dizziness, numbness, headaches and weakness.  Hematological:  Positive for bruising/bleeding tendency.  Psychiatric/Behavioral:  Negative for sleep disturbance.    PMFS History:  Patient Active Problem List   Diagnosis Date Noted   Parkinson's disease ([place]) 02/22/2021   Chondromalacia of both patellae 10/04/2016   History of tremor/ Parkinsons dx.  10/04/2016   History of TIA (transient ischemic attack) 10/04/2016   History of hyperlipidemia 10/04/2016   Tremor 03/24/2016   Rheumatoid arthritis involving multiple sites with positive rheumatoid factor ([place]) 03/24/2016   Primary osteoarthritis of both hands 03/24/2016   Primary osteoarthritis of both knees 03/24/2016   Primary osteoarthritis of both feet 03/24/2016   High risk medication use 03/24/2016    Past Medical History:  Diagnosis Date   HTN (hypertension)    Parkinson's disease ([place])    rheumatoid arthritis    skin cancer     Family History  Problem Relation Age of Onset   Stroke Mother    Heart attack Father    Rheum arthritis Sister    Leukemia Sister    Lung disease Brother    Dementia Brother    Diabetes Maternal Grandmother    Diabetes Paternal Grandmother    Past Surgical History:  Procedure Laterality Date   ABDOMINAL HYSTERECTOMY  bladder tack     FOOT SURGERY     SKIN CANCER EXCISION  11/2015   SQUAMOUS CELL CARCINOMA EXCISION  03/2018   right arm   Social History   Social History Narrative   Right handed    Lives with son    Currently retired   Product/process development scientist History  Administered Date(s) Administered   [employer] Vaccination 05/23/2019, 06/20/2019     Objective: Vital Signs: BP (!) 145/81 (BP Location: Left Arm, Patient Position: Sitting, Cuff Size: Normal)    Pulse 67     Ht 5\' 1"  (1.549 m)    Wt 133 lb (60.3 kg)    BMI 25.13 kg/m    Physical Exam Vitals and nursing note reviewed.  Constitutional:      Appearance: She is well-developed.  HENT:     Head: Normocephalic and atraumatic.  Eyes:     Conjunctiva/sclera: Conjunctivae normal.  Cardiovascular:     Rate and Rhythm: Normal rate and regular rhythm.     Heart sounds: Normal heart sounds.  Pulmonary:     Effort: Pulmonary effort is normal.     Breath sounds: Normal breath sounds.  Abdominal:     General: Bowel sounds are normal.     Palpations: Abdomen is soft.  Musculoskeletal:     Cervical back: Normal range of motion.  Lymphadenopathy:     Cervical: No cervical adenopathy.  Skin:    General: Skin is warm and dry.     Capillary Refill: Capillary refill takes less than 2 seconds.  Neurological:     Mental Status: She is alert and oriented to person, place, and time.  Psychiatric:        Behavior: Behavior normal.     Musculoskeletal Exam: Patient remained seated during the examination today.  C-spine has good range of motion with no discomfort.  She has some tenderness over the right SI joint.  Shoulder joints, elbow joints, wrist joints, MCPs, PIPs, DIPs have good range of motion with no synovitis.  She was able to make a complete fist bilaterally.  Hip joints difficult to assess in seated position.  Knee joints have good range of motion with no warmth or effusion.  Ankle joints have good range of motion with no tenderness or joint swelling.  CDAI Exam: CDAI Score: 0.6  Patient Global: 3 mm; Provider Global: 3 mm Swollen: 0 ; Tender: 0  Joint Exam 06/16/2021   No joint exam has been documented for this visit   There is currently no information documented on the homunculus. Go to the Rheumatology activity and complete the homunculus joint exam.  Investigation: No additional findings.  Imaging: No results found.  Recent Labs: Lab Results  Component Value Date   WBC 5.7  06/14/2021   HGB 13.1 06/14/2021   PLT 269 06/14/2021   NA 131 (L) 06/14/2021   K 4.5 06/14/2021   CL 94 (L) 06/14/2021   CO2 25 06/14/2021   GLUCOSE 102 (H) 06/14/2021   BUN 11 06/14/2021   CREATININE 0.67 06/14/2021   BILITOT 0.2 06/14/2021   ALKPHOS 124 (H) 06/14/2021   AST 15 06/14/2021   ALT 8 06/14/2021   PROT 5.9 (L) 06/14/2021   ALBUMIN 4.1 06/14/2021   CALCIUM 9.2 06/14/2021   GFRAA 90 05/21/2020   QFTBGOLDPLUS NEGATIVE 01/23/2018    Speciality Comments: PLQ Eye Exam: 12/30/2020  WNL @ [facility] up in 1 year  Immunosupressant Lab Work:  TB gold-negative 09/04/13  Hepatitis panel- negative  except Hep A IgM antibody 09/04/13  Procedures:  No procedures performed Allergies: Hydrocodone-acetaminophen, Meloxicam, Prednisone, and Gabapentin   Assessment / Plan:     Visit Diagnoses: Rheumatoid arthritis involving multiple sites with positive rheumatoid factor ([place]) - X-rays of both hands and both feet were obtained on 12/19/2019 and did not show any radiographic progression since 2018: She has no joint tenderness or synovitis on examination today.  She has not had any signs or symptoms of a rheumatoid arthritis flare.  She is clinically doing well taking Plaquenil 200 mg 1 tablet by mouth twice daily Monday through Friday.  She is tolerating Plaquenil without any side effects and has not missed any doses recently.  She has not been experiencing any increased pain or stiffness in her hands or feet recently.  She has occasional discomfort in her knee joints but has no warmth or effusion on exam.  She declined reapplying for Visco gel injections for her knees at this time.  She will remain on Plaquenil as prescribed.  She was advised to notify us if she develops increased joint pain or joint swelling.  She will follow-up in the office in 5 months.  High risk medication use - Plaquenil 200 mg 1 tablet by mouth twice daily Monday through Friday only.  PLQ Eye Exam: 12/30/2020 WNL  @ [facility] up in 1 year.  CBC and CMP updated on 06/14/2021.  Results were reviewed with the patient today in the office.  Primary osteoarthritis of both hands: She is not experiencing any pain or stiffness in her hands at this time.  She has no tenderness or inflammation on examination today.  Discussed the importance of joint protection and muscle strengthening.  Primary osteoarthritis of both knees: She experiences intermittent pain and stiffness in both knee joints.  No mechanical symptoms.  She has not had any joint swelling recently.  On examination she has good range of motion of both knee joints with crepitus bilaterally.  No warmth or effusion was noted. She had Visco gel injections in February 2022 which provided very minimal relief.  She has been riding a stationary bike 30 minutes daily for exercise.  Discussed the importance of lower extremity muscle strengthening.  Chondromalacia of both patellae: She has crepitus in both knee joints.  Discussed the importance of lower extremity muscle strengthening.  Primary osteoarthritis of both feet: She is not experiencing any increased discomfort in her feet at this time.  She has good range of motion of both ankle joints with no tenderness or joint swelling.  She wears proper fitting shoes.  Other medical conditions are listed as follows:  History of TIA (transient ischemic attack)  History of hyperlipidemia  History of tremor/ Parkinsons dx.   Osteoporosis screening  Orders: No orders of the defined types were placed in this encounter.  No orders of the defined types were placed in this encounter.     Follow-Up Instructions: Return in about 5 months (around 11/16/2021) for Rheumatoid arthritis, Osteoarthritis.   Ofilia Neas, PA-C  Note - This record has been created using Dragon software.  Chart creation errors have been sought, but may not always  have been located. Such creation errors do not reflect on  the  standard of medical care.

## 2021-06-11 ENCOUNTER — Telehealth: Payer: Self-pay

## 2021-06-11 DIAGNOSIS — Z79899 Other long term (current) drug therapy: Secondary | ICD-10-CM

## 2021-06-11 NOTE — Telephone Encounter (Signed)
Patient called requesting her labwork orders be sent to [facility] in [place].  Patient plans to go on Monday, 06/14/21.

## 2021-06-11 NOTE — Telephone Encounter (Signed)
Lab Orders released.  

## 2021-06-14 ENCOUNTER — Ambulatory Visit: Payer: Medicare HMO | Admitting: Physician Assistant

## 2021-06-14 DIAGNOSIS — Z79899 Other long term (current) drug therapy: Secondary | ICD-10-CM | POA: Diagnosis not present

## 2021-06-15 LAB — CMP14+EGFR
ALT: 8 IU/L (ref 0–32)
AST: 15 IU/L (ref 0–40)
Albumin/Globulin Ratio: 2.3 — ABNORMAL HIGH (ref 1.2–2.2)
Albumin: 4.1 g/dL (ref 3.7–4.7)
Alkaline Phosphatase: 124 IU/L — ABNORMAL HIGH (ref 44–121)
BUN/Creatinine Ratio: 16 (ref 12–28)
BUN: 11 mg/dL (ref 8–27)
Bilirubin Total: 0.2 mg/dL (ref 0.0–1.2)
CO2: 25 mmol/L (ref 20–29)
Calcium: 9.2 mg/dL (ref 8.7–10.3)
Chloride: 94 mmol/L — ABNORMAL LOW (ref 96–106)
Creatinine, Ser: 0.67 mg/dL (ref 0.57–1.00)
Globulin, Total: 1.8 g/dL (ref 1.5–4.5)
Glucose: 102 mg/dL — ABNORMAL HIGH (ref 70–99)
Potassium: 4.5 mmol/L (ref 3.5–5.2)
Sodium: 131 mmol/L — ABNORMAL LOW (ref 134–144)
Total Protein: 5.9 g/dL — ABNORMAL LOW (ref 6.0–8.5)
eGFR: 92 mL/min/{1.73_m2} (ref >59)

## 2021-06-15 LAB — CBC WITH DIFFERENTIAL/PLATELET
Basophils Absolute: 0.1 10*3/uL (ref 0.0–0.2)
Basos: 1 %
EOS (ABSOLUTE): 0.2 10*3/uL (ref 0.0–0.4)
Eos: 4 %
Hematocrit: 38.1 % (ref 34.0–46.6)
Hemoglobin: 13.1 g/dL (ref 11.1–15.9)
Immature Grans (Abs): 0 10*3/uL (ref 0.0–0.1)
Immature Granulocytes: 0 %
Lymphocytes Absolute: 2.3 10*3/uL (ref 0.7–3.1)
Lymphs: 40 %
MCH: 29.5 pg (ref 26.6–33.0)
MCHC: 34.4 g/dL (ref 31.5–35.7)
MCV: 86 fL (ref 79–97)
Monocytes Absolute: 0.5 10*3/uL (ref 0.1–0.9)
Monocytes: 9 %
Neutrophils Absolute: 2.7 10*3/uL (ref 1.4–7.0)
Neutrophils: 46 %
Platelets: 269 10*3/uL (ref 150–450)
RBC: 4.44 x10E6/uL (ref 3.77–5.28)
RDW: 13.4 % (ref 11.7–15.4)
WBC: 5.7 10*3/uL (ref 3.4–10.8)

## 2021-06-15 NOTE — Progress Notes (Signed)
Sodium and chloride are low.  Alkaline phosphatase is better.  CBC is normal.  Please forward results to her PCP.

## 2021-06-16 ENCOUNTER — Ambulatory Visit: Payer: Medicare HMO | Admitting: Physician Assistant

## 2021-06-16 ENCOUNTER — Other Ambulatory Visit: Payer: Self-pay

## 2021-06-16 ENCOUNTER — Encounter: Payer: Self-pay | Admitting: Physician Assistant

## 2021-06-16 VITALS — BP 145/81 | HR 67 | Ht 61.0 in | Wt 133.0 lb

## 2021-06-16 DIAGNOSIS — M2241 Chondromalacia patellae, right knee: Secondary | ICD-10-CM

## 2021-06-16 DIAGNOSIS — M19072 Primary osteoarthritis, left ankle and foot: Secondary | ICD-10-CM

## 2021-06-16 DIAGNOSIS — M19041 Primary osteoarthritis, right hand: Secondary | ICD-10-CM

## 2021-06-16 DIAGNOSIS — Z8669 Personal history of other diseases of the nervous system and sense organs: Secondary | ICD-10-CM

## 2021-06-16 DIAGNOSIS — M19071 Primary osteoarthritis, right ankle and foot: Secondary | ICD-10-CM

## 2021-06-16 DIAGNOSIS — Z8639 Personal history of other endocrine, nutritional and metabolic disease: Secondary | ICD-10-CM | POA: Diagnosis not present

## 2021-06-16 DIAGNOSIS — M0579 Rheumatoid arthritis with rheumatoid factor of multiple sites without organ or systems involvement: Secondary | ICD-10-CM

## 2021-06-16 DIAGNOSIS — Z79899 Other long term (current) drug therapy: Secondary | ICD-10-CM | POA: Diagnosis not present

## 2021-06-16 DIAGNOSIS — Z13820 Encounter for screening for osteoporosis: Secondary | ICD-10-CM

## 2021-06-16 DIAGNOSIS — Z8673 Personal history of transient ischemic attack (TIA), and cerebral infarction without residual deficits: Secondary | ICD-10-CM | POA: Diagnosis not present

## 2021-06-16 DIAGNOSIS — M19042 Primary osteoarthritis, left hand: Secondary | ICD-10-CM

## 2021-06-16 DIAGNOSIS — M170 Bilateral primary osteoarthritis of knee: Secondary | ICD-10-CM

## 2021-06-16 DIAGNOSIS — M2242 Chondromalacia patellae, left knee: Secondary | ICD-10-CM

## 2021-06-26 DIAGNOSIS — M5416 Radiculopathy, lumbar region: Secondary | ICD-10-CM | POA: Diagnosis not present

## 2021-06-30 DIAGNOSIS — H47233 Glaucomatous optic atrophy, bilateral: Secondary | ICD-10-CM | POA: Diagnosis not present

## 2021-06-30 DIAGNOSIS — H40013 Open angle with borderline findings, low risk, bilateral: Secondary | ICD-10-CM | POA: Diagnosis not present

## 2021-06-30 DIAGNOSIS — H25813 Combined forms of age-related cataract, bilateral: Secondary | ICD-10-CM | POA: Diagnosis not present

## 2021-07-02 ENCOUNTER — Other Ambulatory Visit: Payer: Self-pay | Admitting: Physician Assistant

## 2021-07-02 DIAGNOSIS — M0579 Rheumatoid arthritis with rheumatoid factor of multiple sites without organ or systems involvement: Secondary | ICD-10-CM

## 2021-07-02 NOTE — Telephone Encounter (Signed)
Next Visit: 11/17/2021 ? ?Last Visit: 06/16/2021 ? ?Labs: 06/14/2021 Sodium and chloride are low.  Alkaline phosphatase is better.  CBC is normal.  ? ?Eye exam: 12/30/2020  WNL   ? ?Current Dose per office note 06/16/2021: Plaquenil 200 mg 1 tablet by mouth twice daily Monday through Friday only. ? ?WV:[id number] arthritis involving multiple sites with positive rheumatoid factor  ? ?Last Fill: 12/14/2020 ? ?Okay to refill Plaquenil?  ?

## 2021-07-16 DIAGNOSIS — E785 Hyperlipidemia, unspecified: Secondary | ICD-10-CM | POA: Diagnosis not present

## 2021-07-16 DIAGNOSIS — I10 Essential (primary) hypertension: Secondary | ICD-10-CM | POA: Diagnosis not present

## 2021-07-20 DIAGNOSIS — M48061 Spinal stenosis, lumbar region without neurogenic claudication: Secondary | ICD-10-CM | POA: Diagnosis not present

## 2021-07-20 DIAGNOSIS — M5136 Other intervertebral disc degeneration, lumbar region: Secondary | ICD-10-CM | POA: Diagnosis not present

## 2021-07-20 DIAGNOSIS — M5416 Radiculopathy, lumbar region: Secondary | ICD-10-CM | POA: Diagnosis not present

## 2021-07-21 DIAGNOSIS — C44319 Basal cell carcinoma of skin of other parts of face: Secondary | ICD-10-CM | POA: Diagnosis not present

## 2021-07-21 DIAGNOSIS — L579 Skin changes due to chronic exposure to nonionizing radiation, unspecified: Secondary | ICD-10-CM | POA: Diagnosis not present

## 2021-07-21 DIAGNOSIS — D485 Neoplasm of uncertain behavior of skin: Secondary | ICD-10-CM | POA: Diagnosis not present

## 2021-07-21 DIAGNOSIS — L570 Actinic keratosis: Secondary | ICD-10-CM | POA: Diagnosis not present

## 2021-07-29 DIAGNOSIS — K5903 Drug induced constipation: Secondary | ICD-10-CM | POA: Diagnosis not present

## 2021-07-29 DIAGNOSIS — E785 Hyperlipidemia, unspecified: Secondary | ICD-10-CM | POA: Diagnosis not present

## 2021-07-29 DIAGNOSIS — E559 Vitamin D deficiency, unspecified: Secondary | ICD-10-CM | POA: Diagnosis not present

## 2021-07-29 DIAGNOSIS — M48061 Spinal stenosis, lumbar region without neurogenic claudication: Secondary | ICD-10-CM | POA: Diagnosis not present

## 2021-07-29 DIAGNOSIS — Z6824 Body mass index (BMI) 24.0-24.9, adult: Secondary | ICD-10-CM | POA: Diagnosis not present

## 2021-07-29 DIAGNOSIS — E871 Hypo-osmolality and hyponatremia: Secondary | ICD-10-CM | POA: Diagnosis not present

## 2021-07-29 DIAGNOSIS — I10 Essential (primary) hypertension: Secondary | ICD-10-CM | POA: Diagnosis not present

## 2021-07-29 DIAGNOSIS — T402X5A Adverse effect of other opioids, initial encounter: Secondary | ICD-10-CM | POA: Diagnosis not present

## 2021-07-29 DIAGNOSIS — M810 Age-related osteoporosis without current pathological fracture: Secondary | ICD-10-CM | POA: Diagnosis not present

## 2021-08-03 DIAGNOSIS — N83209 Unspecified ovarian cyst, unspecified side: Secondary | ICD-10-CM | POA: Diagnosis not present

## 2021-08-03 DIAGNOSIS — N83201 Unspecified ovarian cyst, right side: Secondary | ICD-10-CM | POA: Diagnosis not present

## 2021-08-09 ENCOUNTER — Other Ambulatory Visit: Payer: Self-pay | Admitting: Neurology

## 2021-08-10 ENCOUNTER — Other Ambulatory Visit: Payer: Self-pay

## 2021-08-13 DIAGNOSIS — E785 Hyperlipidemia, unspecified: Secondary | ICD-10-CM | POA: Diagnosis not present

## 2021-08-13 DIAGNOSIS — E871 Hypo-osmolality and hyponatremia: Secondary | ICD-10-CM | POA: Diagnosis not present

## 2021-08-13 DIAGNOSIS — E559 Vitamin D deficiency, unspecified: Secondary | ICD-10-CM | POA: Diagnosis not present

## 2021-08-15 DIAGNOSIS — E785 Hyperlipidemia, unspecified: Secondary | ICD-10-CM | POA: Diagnosis not present

## 2021-08-15 DIAGNOSIS — I10 Essential (primary) hypertension: Secondary | ICD-10-CM | POA: Diagnosis not present

## 2021-08-20 NOTE — Progress Notes (Signed)
? ? ?Assessment/Plan:  ? ?1.  Parkinsons Disease ? -Patient looks markedly better this visit than last visit. ? -Continue carbidopa/levodopa 25/100, 1 tablet at 9 AM/1 PM/5 PM ? -Continue carbidopa/levodopa 50/200 CR at bedtime ? -Continue amantadine 100 mg, 1 at 9 AM and 1 PM ? ?2.  Insomnia ? -Likely related to nocturia.  May need to see urology or urogynecology in the future. ? ? ?Subjective:  ? ?Dawn Harrell was seen today in follow up for Parkinsons disease.  My previous records were reviewed prior to todays visit as well as outside records available to me. Pt with sister who supplements hx.   I changed around the dosing of her levodopa last visit and added the CR at bedtime.  She reports that  she is doing well.  She knows if she misses a dosage b/c her toes will curl.  One fall few days ago- was getting out of lift chair and walker wasn't locked and she fell.  Able to get off of floor by self.  Few other falls where "lost balance."  She has issues with wheels getting tangled between [facility] and walker.  Using exercise bike faithfully.   Pt denies lightheadedness, near syncope.  No hallucinations.  Mood has been good. ? ?Current prescribed movement disorder medications: ? ?Carbidopa/levodopa 25/100, 1 tablet at 9 AM/1 PM/5 PM ?Carbidopa/levodopa 50/200 CR at bedtime (started last visit) ?Amantadine, 1 tablet at 9 AM and 1 PM ? ? ? ?ALLERGIES:   ?Allergies  ?Allergen Reactions  ? Hydrocodone-Acetaminophen Shortness Of Breath  ? Meloxicam Nausea And Vomiting and Nausea Only  ? Prednisone Other (See Comments)  ? Gabapentin   ?  Swelling   ? ? ?CURRENT MEDICATIONS:  ?Current Meds  ?Medication Sig  ? acetaminophen (TYLENOL) 500 MG tablet Take 500 mg by mouth as needed.  ? amantadine (SYMMETREL) 100 MG capsule Take 1 capsule (100 mg total) by mouth 2 (two) times daily.  ? amLODipine (NORVASC) 2.5 MG tablet Take by mouth.  ? CALCIUM PO Take by mouth daily. +vitamin D  ? carbidopa-levodopa (SINEMET CR) 50-200 MG tablet  TAKE 1 TABLET AT BEDTIME  ? carbidopa-levodopa (SINEMET IR) 25-100 MG tablet 1 tablet at 9am/1pm/5pm  ? carvedilol (COREG) 3.125 MG tablet Take 3.125 mg by mouth 2 (two) times daily.  ? Cholecalciferol (D-3-5) 125 MCG (5000 UT) capsule Take by mouth.  ? dicyclomine (BENTYL) 20 MG tablet Take 20 mg by mouth 4 (four) times daily.  ? hydroxychloroquine (PLAQUENIL) 200 MG tablet TAKE 1 TABLET TWICE DAILY MONDAY THROUGH FRIDAY  ? Multiple Vitamins-Minerals (MULTIVITAMIN WITH MINERALS) tablet every morning before breakfast.  ? oxyCODONE (OXY IR/ROXICODONE) 5 MG immediate release tablet as needed.  ? vitamin B-12 (CYANOCOBALAMIN) 100 MCG tablet   ? ? ? ?Objective:  ? ?PHYSICAL EXAMINATION:   ? ?VITALS:   ?Vitals:  ? 08/24/21 1109  ?BP: 115/78  ?Pulse: 66  ?SpO2: 97%  ?Weight: 134 lb 3.2 oz (60.9 kg)  ?Height: '5\' 1"'$  (1.549 m)  ? ? ?GEN:  The patient appears stated age and is in NAD. ?HEENT:  Normocephalic, atraumatic.  The mucous membranes are moist. The superficial temporal arteries are without ropiness or tenderness. ?CV:  RRR ?Lungs:  CTAB ?Neck/HEME:  There are no carotid bruits bilaterally. ? ?Neurological examination: ? ?Orientation: The patient is alert and oriented x3. ?Cranial nerves: There is good facial symmetry with mild facial hypomimia. The speech is fluent and clear. Soft palate rises symmetrically and there is no tongue deviation.  Hearing is intact to conversational tone. ?Sensation: Sensation is intact to light touch throughout ?Motor: Strength is at least antigravity x4. ? ?Movement examination: ?Tone: There is normal tone in the UE/LE ?Abnormal movements: There is no tremor today.  There is mild dyskinesia on the L.    ?Coordination:  There is no significant decremation today. ?Gait and Station: The patient pushes off of the chair to arise.  She is given a walker.  She does fairly well with a walker.   ? ?I have reviewed and interpreted the following labs independently ? ?  Chemistry   ?   ?Component  Value Date/Time  ? NA 131 (L) 06/14/2021 1109  ? K 4.5 06/14/2021 1109  ? CL 94 (L) 06/14/2021 1109  ? CO2 25 06/14/2021 1109  ? BUN 11 06/14/2021 1109  ? CREATININE 0.67 06/14/2021 1109  ? CREATININE 0.76 05/21/2020 0918  ?    ?Component Value Date/Time  ? CALCIUM 9.2 06/14/2021 1109  ? ALKPHOS 124 (H) 06/14/2021 1109  ? AST 15 06/14/2021 1109  ? ALT 8 06/14/2021 1109  ? BILITOT 0.2 06/14/2021 1109  ?  ? ? ? ?Lab Results  ?Component Value Date  ? WBC 5.7 06/14/2021  ? HGB 13.1 06/14/2021  ? HCT 38.1 06/14/2021  ? MCV 86 06/14/2021  ? PLT 269 06/14/2021  ? ? ?No results found for: TSH ? ? ?Total time spent on today's visit was 20 minutes, including both face-to-face time and nonface-to-face time.  Time included that spent on review of records (prior notes available to me/labs/imaging if pertinent), discussing treatment and goals, answering patient's questions and coordinating care. ? ?Cc:  Moon, Amy A, NP ? ?

## 2021-08-24 ENCOUNTER — Ambulatory Visit: Payer: Medicare HMO | Admitting: Neurology

## 2021-08-24 VITALS — BP 115/78 | HR 66 | Ht 61.0 in | Wt 134.2 lb

## 2021-08-24 DIAGNOSIS — G20 Parkinson's disease: Secondary | ICD-10-CM | POA: Diagnosis not present

## 2021-08-24 DIAGNOSIS — G249 Dystonia, unspecified: Secondary | ICD-10-CM

## 2021-08-24 NOTE — Patient Instructions (Signed)
Local and Online Resources for Power over Parkinson's Group ?May 2023 ? ?LOCAL [place] PARKINSON'S GROUPS  ?Power over Parkinson's Group:   ?Power Over Parkinson's Patient Education Group will be Wednesday, May 10th-*Hybrid meting*- in person at [facility] location and via [facility] at 2:00 pm.   ?Upcoming Power over Parkinson's Meetings:  2nd Wednesdays of the month at 2 pm:  May 10th, June 14th, July 12th ?Contact Amy Marriott at [email or web address] if interested in participating in this group ?Parkinson's Care Partners Group:    3rd Mondays, Contact Misty Paladino ?Atypical Parkinsonian Patient Group:   4th Wednesdays, Contact Misty Paladino ?If you are interested in participating in these groups with Misty, please contact her directly for how to join those meetings.  Her contact information is [email or web address].   ? ?LOCAL EVENTS AND NEW OFFERINGS ?Moving Day [place]:  Saturday, May 6th, 9:30 am at [place], [place], [place]. Participate in Moving Day as a way to ?honor loved ones, raise funds, fight Parkinson's disease, and celebrate movement.?  Register today at [email or web address] ?[place]!  Play [place]!  Join [place] for home game for a fun evening to bring awareness of Parkinson's and raise funds for our Movement Disorder Funds. Rescheduled to May 11th  6:30 pm [place]. To purchase tickets:  [email or web address] ?Parkinson's T-shirts for sale!  Designed by a local group member, with funds going to [place].  $25.00  Contact Misty to purchase  ?New PWR! Moves [employer] Instructor-Led Class offering at [employer]!  Wednesdays 1-2 pm, starting April 12th.   Contact Bryson Dames, Acupuncturist at [employer].  [email or web address] [email or web address] ? ?ONLINE EDUCATION AND SUPPORT ?[place]:  www.parkinson.org ?PD Health at Home continues:  Mindfulness  Mondays, Wellness Wednesdays, Fitness Fridays  ?Upcoming Education:  ?Understanding Gene and Cell-Based Therapies in Parkinson's.  Wednesday, May 10th at 1:00 pm ?Additional Education offerings virtually through their website-upcoming topics include Palliative Care/Hospice and PD, Sleep and PD ?Register for expert briefings Cytogeneticist) at [email or web address].si ?Please check out their website to sign up for emails and see their full online offerings ? ? ?[place]:  www.[email or web address]  ?Third Thursday Webinars:  On the third Thursday of every month at 12 p.m. ET, join our free live webinars to learn about various aspects of living with Parkinson's disease and our work to speed medical breakthroughs. ?Upcoming Webinar: Get Moving: Exercising for a Healthy Brain.  Thursday, May 18th  at  12 noon. ?Check out additional information on their website to see their full online offerings ? ?[place]:  www.[email or web address] ?Upcoming Webinar:   Stay tuned ?Webinar Series:  Living with Parkinson's Meetup.   Third Thursdays each month, 3 pm ?Care Partner Monthly Meetup.  With Robin Searing [place]inney.  First Tuesday of each month, 2 pm ?Check out additional information to Live Well Today on their website ? ?Parkinson and Movement Disorders (PMD) Alliance:  www.pmdalliance.org ?[employer] Online:  Online Education Events ?Sign up for emails, which are sent weekly to give you updates on programming and online offerings ? ?Parkinson's Association of the [place]:  www.parkinsonassociation.org ?Information on online support groups, education events, and online exercises including Yoga, Parkinson's exercises and more-LOTS of information on links to PD resources and online events ?Virtual Support Group through [employer] of the [place]; next one is scheduled for Wednesday, May 3rd at 2 pm. (These are typically  scheduled for the 1st Wednesday of the month at 2 pm).  Visit website for details. ?MOVEMENT AND  EXERCISE OPPORTUNITIES ?Parkinson's DRUMMING Classes/Music Therapy with Doylene Canning:  This is a returning class and it's FREE!  2nd Mondays, continuing May 8th, 11:00 at the [place].  Contact *Misty Taylor-Paladino at [email or web address] or Doylene Canning at [phone] or [email or web address]  ?PWR! Moves Classes at [place].  Wednesdays 10 and 11 am.   Contact Amy Marriott, PT [email or web address] if interested. ?NEW PWR! Moves Class offering at [employer].  Wednesdays 1-2 pm, starting April 12th.  Contact Bryson Dames, Acupuncturist at [employer].  [email or web address] [email or web address] ?Here is a link to the PWR!Moves classes on Zoom from [place] - Daily Mon-Sat at 10:00. Via Zoom, FREE and open to all.  There is also a link below via Facebook if you use that platform. ? ?[email or web address].si ?https://www.PrepaidParty.no ? ?Parkinson's Wellness Recovery (PWR! Moves)  www.pwr4life.org ?Info on the PWR! Virtual Experience:  You will have access to our expertise through self-assessment, guided plans that start with the PD-specific fundamentals, educational content, tips, Q&A with an expert, and a growing Art therapist of PD-specific pre-recorded and live exercise classes of varying types and intensity - both physical and cognitive! If that is not enough, we offer 1:1 wellness consultations (in-person or virtual) to personalize your PWR! Research scientist (medical).  ?[employer] Fridays:  ?As part of the PD Health @ Home program, this free video series focuses each week on one aspect of fitness designed to support people living with  Parkinson's.  These weekly videos highlight the [place] recent fitness guidelines for people with Parkinson's disease. ?www.KVTVnet.com.cy ?Dance for PD website is offering free, live-stream classes throughout the week, as well as links to [employer] of classes:  https://danceforparkinsons.org/ ?Virtual dance and Pilates for Parkinson's classes: Click on the Community Tab> Parkinson's Movement Initiative Tab.  To register for classes and for more information, visit www.SeekAlumni.co.za and click the ?community? tab.  ?[employer] Parkinson's Cycling Classes  ?[employer]:  Thursdays @ Noon-Live classes at [employer] ([employer] at [email or web address] or [phone]) ?[facility] [employer] [employer]: Virtual Classes Mondays and Thursdays Jeanette Caprice classes Tuesday, Wednesday and Thursday (contact [place] at [email or web address]  or [phone]) ?[employer] ?Varied levels of classes are offered Mondays, Tuesdays and Thursdays at [employer].  ?Stretching with Verdis Frederickson weekly class is also offered for people with Parkinson's ?To observe a class or for more information, call [phone] or email Hezzie Bump at [email or web address] .[email or web address] ?ADDITIONAL SUPPORT AND RESOURCES ?[employer]:Online Caregiver Education Opportunities:  www.well-springsolutions.org/caregiver-education/caregiver-support-group.  You may also contact Vickki Muff at [email or web address] or [phone].    ?Well-Spring Navigator:  Just1Navigator program, a free service to help individuals and families through the journey of determining care for older adults.  The ?Navigator? is a [phone], Education officer, museum, who will speak with a prospective client and/or loved ones to provide an assessment of the situation and a set of recommendations for a personalized care plan -- all free of charge, and whether [employer]  offers the needed service or not. If the need is not a service we provide, we are well-connected with reputable programs in town that we can refer you to.  www.well-springsolutions.org or to speak with the Navigator,

## 2021-09-15 DIAGNOSIS — I10 Essential (primary) hypertension: Secondary | ICD-10-CM | POA: Diagnosis not present

## 2021-09-15 DIAGNOSIS — C44319 Basal cell carcinoma of skin of other parts of face: Secondary | ICD-10-CM | POA: Diagnosis not present

## 2021-09-15 DIAGNOSIS — E785 Hyperlipidemia, unspecified: Secondary | ICD-10-CM | POA: Diagnosis not present

## 2021-09-22 ENCOUNTER — Other Ambulatory Visit: Payer: Self-pay | Admitting: Physician Assistant

## 2021-09-22 DIAGNOSIS — M0579 Rheumatoid arthritis with rheumatoid factor of multiple sites without organ or systems involvement: Secondary | ICD-10-CM

## 2021-09-22 NOTE — Telephone Encounter (Signed)
Next Visit: 11/17/2021   Last Visit: 06/16/2021   Labs: 06/14/2021 Sodium and chloride are low.  Alkaline phosphatase is better.  CBC is normal.    Eye exam: 12/30/2020  WNL     Current Dose per office note 06/16/2021: Plaquenil 200 mg 1 tablet by mouth twice daily Monday through Friday only.   JJ:[id number] arthritis involving multiple sites with positive rheumatoid factor    Last Fill:  07/02/2021   Okay to refill Plaquenil?

## 2021-10-03 ENCOUNTER — Other Ambulatory Visit: Payer: Self-pay | Admitting: Neurology

## 2021-10-11 ENCOUNTER — Other Ambulatory Visit: Payer: Self-pay

## 2021-10-11 MED ORDER — AMANTADINE HCL 100 MG PO CAPS: 100.0000 mg | ORAL_CAPSULE | Freq: Two times a day (BID) | ORAL | 1 refills | Status: DC

## 2021-10-15 ENCOUNTER — Other Ambulatory Visit: Payer: Self-pay

## 2021-10-22 DIAGNOSIS — H25813 Combined forms of age-related cataract, bilateral: Secondary | ICD-10-CM | POA: Diagnosis not present

## 2021-10-22 DIAGNOSIS — H40033 Anatomical narrow angle, bilateral: Secondary | ICD-10-CM | POA: Diagnosis not present

## 2021-11-01 DIAGNOSIS — E785 Hyperlipidemia, unspecified: Secondary | ICD-10-CM | POA: Diagnosis not present

## 2021-11-01 DIAGNOSIS — M810 Age-related osteoporosis without current pathological fracture: Secondary | ICD-10-CM | POA: Diagnosis not present

## 2021-11-01 DIAGNOSIS — I10 Essential (primary) hypertension: Secondary | ICD-10-CM | POA: Diagnosis not present

## 2021-11-01 DIAGNOSIS — M48061 Spinal stenosis, lumbar region without neurogenic claudication: Secondary | ICD-10-CM | POA: Diagnosis not present

## 2021-11-01 DIAGNOSIS — K5903 Drug induced constipation: Secondary | ICD-10-CM | POA: Diagnosis not present

## 2021-11-01 DIAGNOSIS — E559 Vitamin D deficiency, unspecified: Secondary | ICD-10-CM | POA: Diagnosis not present

## 2021-11-01 DIAGNOSIS — T402X5A Adverse effect of other opioids, initial encounter: Secondary | ICD-10-CM | POA: Diagnosis not present

## 2021-11-01 DIAGNOSIS — E871 Hypo-osmolality and hyponatremia: Secondary | ICD-10-CM | POA: Diagnosis not present

## 2021-11-01 DIAGNOSIS — M170 Bilateral primary osteoarthritis of knee: Secondary | ICD-10-CM | POA: Diagnosis not present

## 2021-11-02 DIAGNOSIS — M170 Bilateral primary osteoarthritis of knee: Secondary | ICD-10-CM | POA: Diagnosis not present

## 2021-11-03 NOTE — Progress Notes (Signed)
Office Visit Note  Patient: Dawn Harrell             Date of Birth: December 17, 1945           MRN: [id number]             PCP: Lowella Dandy, NP Referring: Lowella Dandy, NP Visit Date: 11/17/2021 Occupation: '@GUAROCC'$ @  Subjective:  Medication monitoring  History of Present Illness: Dawn Harrell is a 76 y.o. female with history of seropositive rheumatoid arthritis and osteoarthritis.  She states she has been doing well on hydroxychloroquine 200 mg p.o. twice daily Monday to Friday.  She denies any increased joint pain or joint swelling.  She has difficulty climbing stairs and holding objects due to Parkinson's.  She has been taking her medications on a regular basis.  Activities of Daily Living:  Patient reports morning stiffness for 20 minutes.   Patient Reports nocturnal pain.  Difficulty dressing/grooming: Denies Difficulty climbing stairs: Reports Difficulty getting out of chair: Denies Difficulty using hands for taps, buttons, cutlery, and/or writing: Reports  Review of Systems  Constitutional:  Positive for fatigue.  HENT:  Negative for mouth sores and mouth dryness.   Eyes:  Negative for dryness.  Respiratory:  Negative for shortness of breath.   Cardiovascular:  Negative for chest pain and palpitations.  Gastrointestinal:  Negative for blood in stool, constipation and diarrhea.  Endocrine: Negative for increased urination.  Genitourinary:  Negative for involuntary urination.  Musculoskeletal:  Positive for myalgias, morning stiffness and myalgias. Negative for joint pain, joint pain, joint swelling, muscle weakness and muscle tenderness.  Skin:  Positive for hair loss. Negative for color change, rash and sensitivity to sunlight.  Allergic/Immunologic: Negative for susceptible to infections.  Neurological:  Negative for dizziness and headaches.  Hematological:  Negative for swollen glands.  Psychiatric/Behavioral:  Negative for depressed mood and sleep disturbance. The patient  is not nervous/anxious.     PMFS History:  Patient Active Problem List   Diagnosis Date Noted   Parkinson's disease ([place]) 02/22/2021   Chondromalacia of both patellae 10/04/2016   History of tremor/ Parkinsons dx.  10/04/2016   History of TIA (transient ischemic attack) 10/04/2016   History of hyperlipidemia 10/04/2016   Tremor 03/24/2016   Rheumatoid arthritis involving multiple sites with positive rheumatoid factor ([place]) 03/24/2016   Primary osteoarthritis of both hands 03/24/2016   Primary osteoarthritis of both knees 03/24/2016   Primary osteoarthritis of both feet 03/24/2016   High risk medication use 03/24/2016    Past Medical History:  Diagnosis Date   HTN (hypertension)    Parkinson's disease ([place])    rheumatoid arthritis    skin cancer     Family History  Problem Relation Age of Onset   Stroke Mother    Heart attack Father    Rheum arthritis Sister    Leukemia Sister    Lung disease Brother    Dementia Brother    Diabetes Maternal Grandmother    Diabetes Paternal Grandmother    Past Surgical History:  Procedure Laterality Date   ABDOMINAL HYSTERECTOMY     bladder tack     EYE SURGERY Left 11/09/2021   FOOT SURGERY     SKIN CANCER EXCISION  11/2015   SQUAMOUS CELL CARCINOMA EXCISION  03/2018   right arm   Social History   Social History Narrative   Right handed    Lives with son    Currently retired   Product/process development scientist History  Administered Date(s) Administered  Moderna Sars-Covid-2 Vaccination 05/23/2019, 06/20/2019     Objective: Vital Signs: BP (!) 142/83 (BP Location: Left Arm, Patient Position: Sitting, Cuff Size: Normal)   Pulse 64   Resp 17   Ht '5\' 1"'$  (1.549 m)   Wt 130 lb (59 kg)   BMI 24.56 kg/m    Physical Exam Vitals and nursing note reviewed.  Constitutional:      Appearance: She is well-developed.  HENT:     Head: Normocephalic and atraumatic.  Eyes:     Conjunctiva/sclera: Conjunctivae normal.  Cardiovascular:     Rate and  Rhythm: Normal rate and regular rhythm.     Heart sounds: Normal heart sounds.  Pulmonary:     Effort: Pulmonary effort is normal.     Breath sounds: Normal breath sounds.  Abdominal:     General: Bowel sounds are normal.     Palpations: Abdomen is soft.  Musculoskeletal:     Cervical back: Normal range of motion.  Lymphadenopathy:     Cervical: No cervical adenopathy.  Skin:    General: Skin is warm and dry.     Capillary Refill: Capillary refill takes less than 2 seconds.  Neurological:     Mental Status: She is alert and oriented to person, place, and time.  Psychiatric:        Behavior: Behavior normal.      Musculoskeletal Exam: C-spine was in good range of motion.  Shoulder joints, elbow joints, wrist joints, MCPs PIPs and DIPs with good range of motion with no synovitis.  Hip joints, knee joints, ankles, MTPs and PIPs with good range of motion with no synovitis.  CDAI Exam: CDAI Score: 0.4  Patient Global: 3 mm; Provider Global: 1 mm Swollen: 0 ; Tender: 0  Joint Exam 11/17/2021   No joint exam has been documented for this visit   There is currently no information documented on the homunculus. Go to the Rheumatology activity and complete the homunculus joint exam.  Investigation: No additional findings.  Imaging: No results found.  Recent Labs: Lab Results  Component Value Date   WBC 12.1 (H) 11/15/2021   HGB 13.5 11/15/2021   PLT 275 11/15/2021   NA 134 11/15/2021   K 4.6 11/15/2021   CL 95 (L) 11/15/2021   CO2 24 11/15/2021   GLUCOSE 86 11/15/2021   BUN 8 11/15/2021   CREATININE 0.70 11/15/2021   BILITOT 0.4 11/15/2021   ALKPHOS 94 11/15/2021   AST 18 11/15/2021   ALT 8 11/15/2021   PROT 5.8 (L) 11/15/2021   ALBUMIN 4.2 11/15/2021   CALCIUM 9.0 11/15/2021   GFRAA 90 05/21/2020   QFTBGOLDPLUS NEGATIVE 01/23/2018    Speciality Comments: PLQ Eye Exam: 12/30/2020  WNL @ [facility] up in 1 year  Immunosupressant Lab Work:  TB  gold-negative 09/04/13  Hepatitis panel- negative except Hep A IgM antibody 09/04/13  Procedures:  No procedures performed Allergies: Hydrocodone-acetaminophen, Meloxicam, Prednisone, and Gabapentin   Assessment / Plan:     Visit Diagnoses: Rheumatoid arthritis involving multiple sites with positive rheumatoid factor ([place]) -she denies any pain or swelling in her joints.  She had no synovitis on examination.  X-rays of both hands and both feet were obtained on 12/19/2019 and did not show any radiographic progression since 2018:   High risk medication use -she has been taking Plaquenil 200 mg 1 tablet by mouth twice daily Monday through Friday only.  She denies any side effects from the medication.  PLQ Eye Exam:  12/30/2020.  Labs obtained on November 15, 2021 were reviewed which were stable.  White cell count was slightly elevated but she denies any recent infection.  Information regarding immunization was placed in the AVS.  Primary osteoarthritis of both hands-she has bilateral PIP and DIP thickening.  Joint protection was discussed.  Primary osteoarthritis of both knees -she denies any discomfort in her knee joints.  No warmth swelling or effusion was noted.  She had Visco gel injections in February 2022 which provided very minimal relief.  Lower extremity muscle strengthening exercises were discussed.  Chondromalacia of both patellae-she mobilizes with the help of a cane.  Need for regular exercise was emphasized.  Primary osteoarthritis of both feet-she had bilateral PIP and DIP thickening but no synovitis was noted.  History of tremor/ Parkinsons dx.   History of hyperlipidemia  History of TIA (transient ischemic attack)  Osteoporosis screening-patient states that she had DEXA scan through her PCP.  I do not have results available.  Orders: No orders of the defined types were placed in this encounter.  No orders of the defined types were placed in this encounter.    Follow-Up  Instructions: Return in about 5 months (around 04/19/2022) for Rheumatoid arthritis, Osteoarthritis.   Bo Merino, MD  Note - This record has been created using Editor, commissioning.  Chart creation errors have been sought, but may not always  have been located. Such creation errors do not reflect on  the standard of medical care.

## 2021-11-04 ENCOUNTER — Telehealth: Payer: Self-pay | Admitting: Neurology

## 2021-11-04 DIAGNOSIS — G20 Parkinson's disease: Secondary | ICD-10-CM

## 2021-11-04 MED ORDER — AMANTADINE HCL 100 MG PO CAPS: 100.0000 mg | ORAL_CAPSULE | Freq: Two times a day (BID) | ORAL | 1 refills | Status: DC

## 2021-11-04 NOTE — Telephone Encounter (Signed)
Called patient to verify medications and sent in he Amantadine in to [facility]

## 2021-11-04 NOTE — Telephone Encounter (Signed)
Pt called in stating she is having trouble with [facility] and would like for her amantadine and both carbidopa-levodopa prescriptions sent to [facility].

## 2021-11-05 ENCOUNTER — Telehealth: Payer: Self-pay | Admitting: Neurology

## 2021-11-05 DIAGNOSIS — G20 Parkinson's disease: Secondary | ICD-10-CM

## 2021-11-05 MED ORDER — AMANTADINE HCL 100 MG PO CAPS: 100.0000 mg | ORAL_CAPSULE | Freq: Two times a day (BID) | ORAL | 1 refills | Status: DC

## 2021-11-05 NOTE — Addendum Note (Signed)
Addended by: Renae Gloss on: 11/05/2021 02:04 PM   Modules accepted: Orders

## 2021-11-05 NOTE — Telephone Encounter (Signed)
Pt said she has not received her medicine. [facility] has not received the refill

## 2021-11-05 NOTE — Telephone Encounter (Signed)
Resent prescription

## 2021-11-05 NOTE — Telephone Encounter (Signed)
[facility] called in stating they need a new script for this pt's amantadine. They were supposed to have gotten it yesterday, but never received anything.

## 2021-11-09 DIAGNOSIS — N189 Chronic kidney disease, unspecified: Secondary | ICD-10-CM | POA: Diagnosis not present

## 2021-11-09 DIAGNOSIS — I129 Hypertensive chronic kidney disease with stage 1 through stage 4 chronic kidney disease, or unspecified chronic kidney disease: Secondary | ICD-10-CM | POA: Diagnosis not present

## 2021-11-09 DIAGNOSIS — H259 Unspecified age-related cataract: Secondary | ICD-10-CM | POA: Diagnosis not present

## 2021-11-09 DIAGNOSIS — H40033 Anatomical narrow angle, bilateral: Secondary | ICD-10-CM | POA: Diagnosis not present

## 2021-11-09 DIAGNOSIS — G20 Parkinson's disease: Secondary | ICD-10-CM | POA: Diagnosis not present

## 2021-11-09 DIAGNOSIS — H25811 Combined forms of age-related cataract, right eye: Secondary | ICD-10-CM | POA: Diagnosis not present

## 2021-11-09 DIAGNOSIS — H25812 Combined forms of age-related cataract, left eye: Secondary | ICD-10-CM | POA: Diagnosis not present

## 2021-11-09 HISTORY — PX: EYE SURGERY: SHX253

## 2021-11-12 ENCOUNTER — Other Ambulatory Visit: Payer: Self-pay | Admitting: *Deleted

## 2021-11-12 ENCOUNTER — Telehealth: Payer: Self-pay | Admitting: Rheumatology

## 2021-11-12 DIAGNOSIS — Z79899 Other long term (current) drug therapy: Secondary | ICD-10-CM

## 2021-11-12 NOTE — Telephone Encounter (Signed)
Lab Orders released.  

## 2021-11-12 NOTE — Telephone Encounter (Signed)
Patient called the office requesting lab orders be sent to [facility] in [place]. Patient going Monday.

## 2021-11-15 DIAGNOSIS — Z79899 Other long term (current) drug therapy: Secondary | ICD-10-CM | POA: Diagnosis not present

## 2021-11-16 DIAGNOSIS — H25812 Combined forms of age-related cataract, left eye: Secondary | ICD-10-CM | POA: Diagnosis not present

## 2021-11-16 LAB — CMP14+EGFR
ALT: 8 IU/L (ref 0–32)
AST: 18 IU/L (ref 0–40)
Albumin/Globulin Ratio: 2.6 — ABNORMAL HIGH (ref 1.2–2.2)
Albumin: 4.2 g/dL (ref 3.8–4.8)
Alkaline Phosphatase: 94 IU/L (ref 44–121)
BUN/Creatinine Ratio: 11 — ABNORMAL LOW (ref 12–28)
BUN: 8 mg/dL (ref 8–27)
Bilirubin Total: 0.4 mg/dL (ref 0.0–1.2)
CO2: 24 mmol/L (ref 20–29)
Calcium: 9 mg/dL (ref 8.7–10.3)
Chloride: 95 mmol/L — ABNORMAL LOW (ref 96–106)
Creatinine, Ser: 0.7 mg/dL (ref 0.57–1.00)
Globulin, Total: 1.6 g/dL (ref 1.5–4.5)
Glucose: 86 mg/dL (ref 70–99)
Potassium: 4.6 mmol/L (ref 3.5–5.2)
Sodium: 134 mmol/L (ref 134–144)
Total Protein: 5.8 g/dL — ABNORMAL LOW (ref 6.0–8.5)
eGFR: 90 mL/min/{1.73_m2} (ref >59)

## 2021-11-16 LAB — CBC WITH DIFFERENTIAL/PLATELET
Basophils Absolute: 0.1 10*3/uL (ref 0.0–0.2)
Basos: 1 %
EOS (ABSOLUTE): 0.1 10*3/uL (ref 0.0–0.4)
Eos: 1 %
Hematocrit: 39.6 % (ref 34.0–46.6)
Hemoglobin: 13.5 g/dL (ref 11.1–15.9)
Immature Grans (Abs): 0 10*3/uL (ref 0.0–0.1)
Immature Granulocytes: 0 %
Lymphocytes Absolute: 2.3 10*3/uL (ref 0.7–3.1)
Lymphs: 19 %
MCH: 28.9 pg (ref 26.6–33.0)
MCHC: 34.1 g/dL (ref 31.5–35.7)
MCV: 85 fL (ref 79–97)
Monocytes Absolute: 0.7 10*3/uL (ref 0.1–0.9)
Monocytes: 5 %
Neutrophils Absolute: 8.9 10*3/uL — ABNORMAL HIGH (ref 1.4–7.0)
Neutrophils: 74 %
Platelets: 275 10*3/uL (ref 150–450)
RBC: 4.67 x10E6/uL (ref 3.77–5.28)
RDW: 12.7 % (ref 11.7–15.4)
WBC: 12.1 10*3/uL — ABNORMAL HIGH (ref 3.4–10.8)

## 2021-11-16 NOTE — Progress Notes (Signed)
White count is elevated which could be seen with infection or use of prednisone use or cortisone injection.  If patient had none of the above we can continue to monitor.  CMP is stable.

## 2021-11-17 ENCOUNTER — Ambulatory Visit: Payer: Medicare HMO | Attending: Rheumatology | Admitting: Rheumatology

## 2021-11-17 ENCOUNTER — Encounter: Payer: Self-pay | Admitting: Rheumatology

## 2021-11-17 VITALS — BP 142/83 | HR 64 | Resp 17 | Ht 61.0 in | Wt 130.0 lb

## 2021-11-17 DIAGNOSIS — Z79899 Other long term (current) drug therapy: Secondary | ICD-10-CM | POA: Diagnosis not present

## 2021-11-17 DIAGNOSIS — Z8669 Personal history of other diseases of the nervous system and sense organs: Secondary | ICD-10-CM

## 2021-11-17 DIAGNOSIS — M19071 Primary osteoarthritis, right ankle and foot: Secondary | ICD-10-CM | POA: Diagnosis not present

## 2021-11-17 DIAGNOSIS — M170 Bilateral primary osteoarthritis of knee: Secondary | ICD-10-CM | POA: Diagnosis not present

## 2021-11-17 DIAGNOSIS — M0579 Rheumatoid arthritis with rheumatoid factor of multiple sites without organ or systems involvement: Secondary | ICD-10-CM

## 2021-11-17 DIAGNOSIS — M2241 Chondromalacia patellae, right knee: Secondary | ICD-10-CM | POA: Diagnosis not present

## 2021-11-17 DIAGNOSIS — Z8673 Personal history of transient ischemic attack (TIA), and cerebral infarction without residual deficits: Secondary | ICD-10-CM

## 2021-11-17 DIAGNOSIS — Z13820 Encounter for screening for osteoporosis: Secondary | ICD-10-CM

## 2021-11-17 DIAGNOSIS — M2242 Chondromalacia patellae, left knee: Secondary | ICD-10-CM

## 2021-11-17 DIAGNOSIS — Z8639 Personal history of other endocrine, nutritional and metabolic disease: Secondary | ICD-10-CM | POA: Diagnosis not present

## 2021-11-17 DIAGNOSIS — M19041 Primary osteoarthritis, right hand: Secondary | ICD-10-CM

## 2021-11-17 DIAGNOSIS — M19042 Primary osteoarthritis, left hand: Secondary | ICD-10-CM

## 2021-11-17 DIAGNOSIS — M19072 Primary osteoarthritis, left ankle and foot: Secondary | ICD-10-CM

## 2021-11-17 NOTE — Patient Instructions (Signed)
Standing Labs We placed an order today for your standing lab work.   Please have your standing labs drawn in January  If possible, please have your labs drawn 2 weeks prior to your appointment so that the provider can discuss your results at your appointment.  Please note that you may see your imaging and lab results in [facility] before we have reviewed them. We may be awaiting multiple results to interpret others before contacting you. Please allow our office up to 72 hours to thoroughly review all of the results before contacting the office for clarification of your results.  We have open lab daily: Monday through Thursday from 1:30-4:30 PM and Friday from 1:30-4:00 PM at the office of Dr. Bo Merino, [facility].   Please be advised, all patients with office appointments requiring lab work will take precedent over walk-in lab work.  If possible, please come for your lab work on Monday and Friday afternoons, as you may experience shorter wait times. The office is located at [address] [place], [place], [place] [address] No appointment is necessary.   Labs are drawn by [facility]. Please bring your co-pay at the time of your lab draw.  You may receive a bill from [facility] for your lab work.  Please note if you are on Hydroxychloroquine and and an order has been placed for a Hydroxychloroquine level, you will need to have it drawn 4 hours or more after your last dose.  If you wish to have your labs drawn at another location, please call the office 24 hours in advance to send orders.  If you have any questions regarding directions or hours of operation,  please call [phone].   As a reminder, please drink plenty of water prior to coming for your lab work. Thanks!  Vaccines You are taking a medication(s) that can suppress your immune system.  The following immunizations are recommended: Flu annually Covid-19  Td/Tdap (tetanus, diphtheria, pertussis) every 10  years Pneumonia (Prevnar 15 then Pneumovax 23 at least 1 year apart.  Alternatively, can take Prevnar 20 without needing additional dose) Shingrix: 2 doses from 4 weeks to 6 months apart  Please check with your PCP to make sure you are up to date.

## 2021-12-22 ENCOUNTER — Telehealth: Payer: Self-pay | Admitting: Neurology

## 2021-12-22 NOTE — Telephone Encounter (Signed)
1. Which medications need refilled? (List name and dosage, if known) carbidopa levodopa 25/100 mg, 4 a day   2. Which pharmacy/location is medication to be sent to? (include street and city if local pharmacy) [facility]  3. Do they need a 30 day or 90 day supply? 90  Patient is switching pharmacies and they'll need a new Rx please.

## 2021-12-23 ENCOUNTER — Other Ambulatory Visit: Payer: Self-pay

## 2021-12-23 NOTE — Telephone Encounter (Signed)
Called patient and she said she has been taking it for awhile since last appointment . Patient said her toes were curling under and cramping so she started taking it around 5 AM and it helped. I let patient know she needs to check with Dr Tat before changing her dosage of medication

## 2021-12-23 NOTE — Telephone Encounter (Signed)
Patient is saying she is taking Carbidopa levodopa 4 times a day 1  at 5AM 9AM 1PM and  at 5pm plus her 50/200 at 9PM. I only see in your notes three times a day at 9 , 1 and 5pm

## 2021-12-23 NOTE — Telephone Encounter (Signed)
Called patient waiting for response about dosage from Dr. Carles Collet

## 2021-12-23 NOTE — Telephone Encounter (Signed)
I have two notes open for this call please refer to second note

## 2021-12-24 ENCOUNTER — Other Ambulatory Visit: Payer: Self-pay

## 2021-12-24 MED ORDER — CARBIDOPA-LEVODOPA 25-100 MG PO TABS: ORAL_TABLET | ORAL | 0 refills | Status: DC

## 2021-12-28 DIAGNOSIS — E7800 Pure hypercholesterolemia, unspecified: Secondary | ICD-10-CM | POA: Diagnosis not present

## 2021-12-28 DIAGNOSIS — H25811 Combined forms of age-related cataract, right eye: Secondary | ICD-10-CM | POA: Diagnosis not present

## 2021-12-28 DIAGNOSIS — Z79899 Other long term (current) drug therapy: Secondary | ICD-10-CM | POA: Diagnosis not present

## 2021-12-28 DIAGNOSIS — G20 Parkinson's disease: Secondary | ICD-10-CM | POA: Diagnosis not present

## 2021-12-28 DIAGNOSIS — I10 Essential (primary) hypertension: Secondary | ICD-10-CM | POA: Diagnosis not present

## 2021-12-28 DIAGNOSIS — Z8673 Personal history of transient ischemic attack (TIA), and cerebral infarction without residual deficits: Secondary | ICD-10-CM | POA: Diagnosis not present

## 2021-12-28 DIAGNOSIS — H259 Unspecified age-related cataract: Secondary | ICD-10-CM | POA: Diagnosis not present

## 2022-01-15 DIAGNOSIS — I10 Essential (primary) hypertension: Secondary | ICD-10-CM | POA: Diagnosis not present

## 2022-01-15 DIAGNOSIS — G20 Parkinson's disease: Secondary | ICD-10-CM | POA: Diagnosis not present

## 2022-01-15 DIAGNOSIS — M05761 Rheumatoid arthritis with rheumatoid factor of right knee without organ or systems involvement: Secondary | ICD-10-CM | POA: Diagnosis not present

## 2022-01-18 DIAGNOSIS — N83209 Unspecified ovarian cyst, unspecified side: Secondary | ICD-10-CM | POA: Diagnosis not present

## 2022-01-19 DIAGNOSIS — L579 Skin changes due to chronic exposure to nonionizing radiation, unspecified: Secondary | ICD-10-CM | POA: Diagnosis not present

## 2022-01-19 DIAGNOSIS — L814 Other melanin hyperpigmentation: Secondary | ICD-10-CM | POA: Diagnosis not present

## 2022-01-19 DIAGNOSIS — D2262 Melanocytic nevi of left upper limb, including shoulder: Secondary | ICD-10-CM | POA: Diagnosis not present

## 2022-01-19 DIAGNOSIS — Z85828 Personal history of other malignant neoplasm of skin: Secondary | ICD-10-CM | POA: Diagnosis not present

## 2022-01-19 DIAGNOSIS — L821 Other seborrheic keratosis: Secondary | ICD-10-CM | POA: Diagnosis not present

## 2022-01-28 ENCOUNTER — Telehealth: Payer: Self-pay | Admitting: Anesthesiology

## 2022-01-28 MED ORDER — CARBIDOPA-LEVODOPA ER 50-200 MG PO TBCR: 1.0000 | EXTENDED_RELEASE_TABLET | Freq: Every day | ORAL | 0 refills | Status: DC

## 2022-01-28 NOTE — Telephone Encounter (Signed)
Patient called requesting a refill on her Carbidopa-Levodopa 50-200 mg. She would like her prescription to be sent to [facility] [phone].

## 2022-02-01 DIAGNOSIS — Z79899 Other long term (current) drug therapy: Secondary | ICD-10-CM | POA: Diagnosis not present

## 2022-02-01 DIAGNOSIS — K5903 Drug induced constipation: Secondary | ICD-10-CM | POA: Diagnosis not present

## 2022-02-01 DIAGNOSIS — I10 Essential (primary) hypertension: Secondary | ICD-10-CM | POA: Diagnosis not present

## 2022-02-01 DIAGNOSIS — E871 Hypo-osmolality and hyponatremia: Secondary | ICD-10-CM | POA: Diagnosis not present

## 2022-02-01 DIAGNOSIS — E559 Vitamin D deficiency, unspecified: Secondary | ICD-10-CM | POA: Diagnosis not present

## 2022-02-01 DIAGNOSIS — Z139 Encounter for screening, unspecified: Secondary | ICD-10-CM | POA: Diagnosis not present

## 2022-02-01 DIAGNOSIS — T402X5A Adverse effect of other opioids, initial encounter: Secondary | ICD-10-CM | POA: Diagnosis not present

## 2022-02-01 DIAGNOSIS — M48061 Spinal stenosis, lumbar region without neurogenic claudication: Secondary | ICD-10-CM | POA: Diagnosis not present

## 2022-02-01 DIAGNOSIS — M810 Age-related osteoporosis without current pathological fracture: Secondary | ICD-10-CM | POA: Diagnosis not present

## 2022-02-21 DIAGNOSIS — M4126 Other idiopathic scoliosis, lumbar region: Secondary | ICD-10-CM | POA: Diagnosis not present

## 2022-02-21 DIAGNOSIS — M48061 Spinal stenosis, lumbar region without neurogenic claudication: Secondary | ICD-10-CM | POA: Diagnosis not present

## 2022-02-21 DIAGNOSIS — M25551 Pain in right hip: Secondary | ICD-10-CM | POA: Diagnosis not present

## 2022-02-21 DIAGNOSIS — M545 Low back pain, unspecified: Secondary | ICD-10-CM | POA: Diagnosis not present

## 2022-02-21 DIAGNOSIS — G8929 Other chronic pain: Secondary | ICD-10-CM | POA: Diagnosis not present

## 2022-02-21 DIAGNOSIS — M5431 Sciatica, right side: Secondary | ICD-10-CM | POA: Diagnosis not present

## 2022-02-24 NOTE — Progress Notes (Signed)
Assessment/Plan:   1.  Parkinsons Disease  -Continue carbidopa/levodopa 25/100, 1 tablet at , 5am/9 AM/1 PM/5 PM  -Continue carbidopa/levodopa 50/200 CR at bedtime  -Continue amantadine 100 mg, 1 at 9 AM and 1 PM  -Follows with dermatology.  -discussed using walker at all times  2.  Insomnia  -Likely related to nocturia.  May need to see urology or urogynecology in the future.   Subjective:   Dawn Harrell was seen today in follow up for Parkinsons disease.  My previous records were reviewed prior to todays visit as well as outside records available to me. Pt with sister who supplements hx.   She had a fall 2-3 weeks ago; she was trying to get in the bed and she slid out.  She did hit her head but no LOC and didn't really get hurt.  She had a few other "little falls."  she has not had any lightheadedness or near syncope since our last visit.  She is following with dermatology.  No hallucinations.  Current prescribed movement disorder medications:  Carbidopa/levodopa 25/100, 1 tablet at 5am/9 AM/1 PM/5 PM Carbidopa/levodopa 50/200 CR at bedtime  Amantadine, 1 tablet at 9 AM and 1 PM    ALLERGIES:   Allergies  Allergen Reactions   Hydrocodone-Acetaminophen Shortness Of Breath   Meloxicam Nausea And Vomiting and Nausea Only   Prednisone Other (See Comments)   Gabapentin     Swelling     CURRENT MEDICATIONS:  Current Meds  Medication Sig   acetaminophen (TYLENOL) 500 MG tablet Take 500 mg by mouth as needed.   alendronate (FOSAMAX) 70 MG tablet Take 70 mg by mouth once a week.   amantadine (SYMMETREL) 100 MG capsule Take 1 capsule (100 mg total) by mouth 2 (two) times daily.   amLODipine (NORVASC) 2.5 MG tablet Take by mouth.   ASPIRIN 81 PO Take by mouth daily.   CALCIUM PO Take by mouth daily. +vitamin D   carbidopa-levodopa (SINEMET CR) 50-200 MG tablet Take 1 tablet by mouth at bedtime.   carbidopa-levodopa (SINEMET IR) 25-100 MG tablet 1 tablet at  5AM/  9am/1pm/5pm   carvedilol (COREG) 3.125 MG tablet Take 3.125 mg by mouth 2 (two) times daily.   Cholecalciferol (D-3-5) 125 MCG (5000 UT) capsule Take by mouth.   Cholecalciferol 25 MCG (1000 UT) tablet Take by mouth.   dicyclomine (BENTYL) 20 MG tablet Take 20 mg by mouth 4 (four) times daily.   folic acid (FOLVITE) 809 MCG tablet Take by mouth.   hydroxychloroquine (PLAQUENIL) 200 MG tablet TAKE 1 TABLET TWICE DAILY MONDAY THROUGH FRIDAY   Multiple Vitamins-Minerals (MULTIVITAMIN WITH MINERALS) tablet every morning before breakfast.   oxyCODONE (OXY IR/ROXICODONE) 5 MG immediate release tablet as needed.   vitamin B-12 (CYANOCOBALAMIN) 100 MCG tablet      Objective:   PHYSICAL EXAMINATION:    VITALS:   Vitals:   03/01/22 0926  BP: (!) 143/87  Pulse: 63  SpO2: 99%  Weight: 133 lb (60.3 kg)  Height: '5\' 1"'$  (1.549 m)     GEN:  The patient appears stated age and is in NAD. HEENT:  Normocephalic, atraumatic.  The mucous membranes are moist. The superficial temporal arteries are without ropiness or tenderness. CV:  RRR Lungs:  CTAB Neck/HEME:  There are no carotid bruits bilaterally.  Neurological examination:  Orientation: The patient is alert and oriented x3. Cranial nerves: There is good facial symmetry with mild facial hypomimia. The speech is fluent and clear. Soft  palate rises symmetrically and there is no tongue deviation. Hearing is intact to conversational tone. Sensation: Sensation is intact to light touch throughout Motor: Strength is at least antigravity x4.  Movement examination: Tone: There is normal tone in the UE/LE Abnormal movements: There is no tremor today.  There is mild dyskinesia on the L.   this is stable Coordination:  There is no significant decremation today. Gait and Station: The patient pushes off of the chair to arise.  She has her cane and is short stepped but does better in the hallway.  I have reviewed and interpreted the following labs  independently    Chemistry      Component Value Date/Time   NA 134 11/15/2021 1001   K 4.6 11/15/2021 1001   CL 95 (L) 11/15/2021 1001   CO2 24 11/15/2021 1001   BUN 8 11/15/2021 1001   CREATININE 0.70 11/15/2021 1001   CREATININE 0.76 05/21/2020 0918      Component Value Date/Time   CALCIUM 9.0 11/15/2021 1001   ALKPHOS 94 11/15/2021 1001   AST 18 11/15/2021 1001   ALT 8 11/15/2021 1001   BILITOT 0.4 11/15/2021 1001       Lab Results  Component Value Date   WBC 12.1 (H) 11/15/2021   HGB 13.5 11/15/2021   HCT 39.6 11/15/2021   MCV 85 11/15/2021   PLT 275 11/15/2021    No results found for: "TSH"    Cc:  Moon, Amy A, NP

## 2022-03-01 ENCOUNTER — Other Ambulatory Visit: Payer: Self-pay | Admitting: Neurology

## 2022-03-01 ENCOUNTER — Ambulatory Visit: Payer: Medicare HMO | Admitting: Neurology

## 2022-03-01 ENCOUNTER — Encounter: Payer: Self-pay | Admitting: Neurology

## 2022-03-01 VITALS — BP 143/87 | HR 63 | Ht 61.0 in | Wt 133.0 lb

## 2022-03-01 DIAGNOSIS — G20B2 Parkinson's disease with dyskinesia, with fluctuations: Secondary | ICD-10-CM | POA: Diagnosis not present

## 2022-03-01 NOTE — Patient Instructions (Signed)
Local and Online Resources for Power over Parkinson's Group  November 2023    LOCAL [place] PARKINSON'S GROUPS   Power over Parkinson's Group:    Power Over Parkinson's Patient Education Group will be Wednesday, November 8th-*Hybrid meting*- in person at [facility] location and via [facility], 2:00-3:00 pm.   Starting in November, Power over [employer] and Care Partner Groups will meet together, with plans for separate break out session for caregivers (*this will be evolving over the next few months) Upcoming Power over Parkinson's Meetings/Care Partner Support:  2nd Wednesdays of the month at 2 pm:   November 8th, December 13th  [place] at [email or web address] if interested in participating in this group    [place] and Fall Prevention Workshop.  Thursday, November 9th 1-2pm, Studio [employer], [employer].  Register with Vonna Kotyk at [email or web address] or [phone] New PWR! Moves [employer] Instructor-Led Classes offering at [employer]!  TUESDAYS and Wednesdays 1-2 pm.   Contact Vonna Kotyk at  [email or web address]  or [phone] (Tuesday classes are modified for chair and standing only) Dance for Parkinson 's classes will be on Tuesdays 9:30am-10:30am starting October 3-December 12 with a break the week of November 21st. Located in the [employer] , in the first floor of the [employer] ([place].) To register:  [email or web address] or [phone]  Drumming for Parkinson's will be held on 2nd and 4th Mondays at 11:00 am.   Located at the [place] ([place]  [place] at [email or web address] or [phone]  Through support from the [place] for Parkinson's classes are free for both patients and caregivers.    [employer] Parkinson's Tai Chi  Class, Mondays at 11 am.  Call [phone] for details Parkinson's Holiday Party.  Wednesday, December 6th, 4:00-5:00 pm.  [facility] and Fitness.  RSVP to Garnetta Buddy at [phone] or [email or web address]   [place]:  [email or web address]  PD Health at Home continues:  Mindfulness Mondays, Wellness Wednesdays, Fitness Fridays   Upcoming Education:   Why Should you Participate in Parkinson's Research?  Wednesday, Nov. 29th,  1-2 pm  Expert Briefing:    Hallucinations and Delusions in Parkinson's.  Wednesday, Nov. 8th, 1-2 pm  Register for expert briefings (webinars) at [email or web address]  Please check out their website to sign up for emails and see their full online offerings      [place]:  [email or web address]   Third Thursday Webinars:  On the third Thursday of every month at 12 p.m. ET, join our free live webinars to learn about various aspects of living with Parkinson's disease and our work to speed medical breakthroughs.  Upcoming Webinar:  A Year Like No Other in Parkinson's Research:  2023 in Review.  Thursday, November 16th 12 noon. Check out additional information on their website to see their full online offerings    [facility]:  [email or web address]  Upcoming Webinar:   Stay tuned  Webinar Series:  Living with Parkinson's Meetup.   Third Thursdays each month, 3 pm  Care Partner Monthly Meetup.  With Robin Searing Phinney.  First Tuesday of each month, 2 pm  Check out additional information to Live Well Today on their website    Parkinson and Movement Disorders (PMD) Alliance:  www.[email or web address]  [employer]:  Online Education Events  Sign up for emails, which are sent weekly to give  you updates on programming and online offerings    Parkinson's Association of the [place]:  www.[email or web address]   Information on online support groups, education events, and online exercises including Yoga, Parkinson's exercises and more-LOTS of information on links to PD resources and online events  Virtual Support Group through Parkinson's Association of the [place]; next one is scheduled for Wednesday, November 1st  at 2 pm.  (These are typically scheduled for the 1st Wednesday of the month at 2 pm).  Visit website for details.   MOVEMENT AND EXERCISE OPPORTUNITIES  PWR! Moves Classes at [place].  Wednesdays 10 and 11 am.   Contact Amy Marriott, PT [email or web address] if interested.  NEW PWR! Moves Class offerings at [employer].  *TUESDAYS* and Wednesdays 1-2 pm.  Contact Vonna Kotyk at  [email or web address]    Parkinson's Wellness Recovery (PWR! Moves)  www.[email or web address]  Info on the PWR! Virtual Experience:  You will have access to our expertise?through self-assessment, guided plans that start with the PD-specific fundamentals, educational content, tips, Q&A with an expert, and a growing Art therapist of PD-specific pre-recorded and live exercise classes of varying types and intensity - both physical and cognitive! If that is not enough, we offer 1:1 wellness consultations (in-person or virtual) to personalize your PWR! Research scientist (medical).   [place] Fridays:   As part of the PD Health @ Home program, this free video series focuses each week on one aspect of fitness designed to support people living with Parkinson's.? These weekly videos highlight the [place] fitness guidelines for people with Parkinson's disease.  [email or web address]   Dance for PD website is offering free, live-stream classes throughout the week, as well as links to [employer] of classes:  [email or web address]  Virtual dance and Pilates for Parkinson's classes: Click on the Community Tab> Parkinson's  Movement Initiative Tab.  To register for classes and for more information, visit [email or web address] and click the "community" tab.   [employer] Parkinson's Cycling Classes   [employer]:  Thursdays @ Noon-Live classes at [employer] ([employer] at [email or web address]?or [phone])  [facility] [employer]: Virtual Classes Mondays and Thursdays Jeanette Caprice classes Tuesday, Wednesday and Thursday (contact [place] at [email or web address] ?or [phone])  [place]  Varied levels of classes are offered Tuesdays and Thursdays at [employer].   Stretching with Verdis Frederickson weekly class is also offered for people with Parkinson's  To observe a class or for more information, call [phone] or email Hezzie Bump at [email or web address]   ADDITIONAL SUPPORT AND RESOURCES  [employer]:Online Caregiver Education Opportunities:  www.well-springsolutions.org/caregiver-education/caregiver-support-group.  You may also contact Vickki Muff at [email or web address] or [phone].     Well[employer]Spring Navigator:  Just1Navigator program, a?free service to help individuals and families through the journey of determining care for older adults.  The "Navigator" is a Education officer, museum, Arnell Asal, who will speak with a prospective client and/or loved ones to provide an assessment of the situation and a set of recommendations for a personalized care plan -- all free of charge, and whether?[employer] offers the needed service or not. If the need is not a service we provide, we are well-connected with reputable programs in town that we can refer you to.  www.well-springsolutions.org or to speak with the Navigator, call [phone].

## 2022-03-08 DIAGNOSIS — M5416 Radiculopathy, lumbar region: Secondary | ICD-10-CM | POA: Diagnosis not present

## 2022-03-08 DIAGNOSIS — M48061 Spinal stenosis, lumbar region without neurogenic claudication: Secondary | ICD-10-CM | POA: Diagnosis not present

## 2022-03-08 DIAGNOSIS — M5136 Other intervertebral disc degeneration, lumbar region: Secondary | ICD-10-CM | POA: Diagnosis not present

## 2022-03-21 ENCOUNTER — Telehealth: Payer: Self-pay | Admitting: Neurology

## 2022-03-21 ENCOUNTER — Other Ambulatory Visit: Payer: Self-pay

## 2022-03-21 DIAGNOSIS — G20A1 Parkinson's disease without dyskinesia, without mention of fluctuations: Secondary | ICD-10-CM

## 2022-03-21 MED ORDER — AMANTADINE HCL 100 MG PO CAPS: 100.0000 mg | ORAL_CAPSULE | Freq: Two times a day (BID) | ORAL | 1 refills | Status: DC

## 2022-03-21 NOTE — Telephone Encounter (Signed)
1. Which medications need refilled? (List name and dosage, if known) amantadine  2. Which pharmacy/location is medication to be sent to? (include street and city if local pharmacy) [facility]

## 2022-03-28 ENCOUNTER — Other Ambulatory Visit: Payer: Self-pay | Admitting: Neurology

## 2022-04-05 DIAGNOSIS — M5416 Radiculopathy, lumbar region: Secondary | ICD-10-CM | POA: Diagnosis not present

## 2022-04-18 ENCOUNTER — Other Ambulatory Visit: Payer: Self-pay | Admitting: Neurology

## 2022-04-18 DIAGNOSIS — G20A1 Parkinson's disease without dyskinesia, without mention of fluctuations: Secondary | ICD-10-CM

## 2022-04-22 NOTE — Progress Notes (Signed)
Office Visit Note  Patient: Dawn Harrell             Date of Birth: 1945/12/07           MRN: [id number]             PCP: Lowella Dandy, NP Referring: Lowella Dandy, NP Visit Date: 05/04/2022 Occupation: '@GUAROCC'$ @  Subjective:  Medication management  History of Present Illness: Dawn Harrell is a 77 y.o. female with history of seropositive rheumatoid arthritis and osteoarthritis.  She states she has been having some stiffness and discomfort in her bilateral hands.  She has not noticed any joint swelling.  She has been taking hydroxychloroquine 200 mg p.o. twice a day Monday to Friday.  She does not recall when her last eye examination was.  She states she is been going to the ophthalmologist on a regular basis.  She had cataract surgery last year.  She still has difficulty climbing stairs.  She states her Parkinson symptoms are fairly well-controlled.    Activities of Daily Living:  Patient reports morning stiffness for a few minutes.   Patient Reports nocturnal pain.  Difficulty dressing/grooming: Denies Difficulty climbing stairs: Reports Difficulty getting out of chair: Denies Difficulty using hands for taps, buttons, cutlery, and/or writing: Reports  Review of Systems  Constitutional:  Positive for fatigue.  HENT:  Positive for mouth dryness. Negative for mouth sores.   Eyes:  Negative for dryness.  Respiratory:  Negative for shortness of breath.   Cardiovascular:  Negative for chest pain and palpitations.  Gastrointestinal:  Positive for constipation. Negative for blood in stool and diarrhea.  Endocrine: Negative for increased urination.  Genitourinary:  Negative for involuntary urination.  Musculoskeletal:  Positive for joint pain, joint pain, joint swelling and morning stiffness. Negative for gait problem, myalgias, muscle weakness, muscle tenderness and myalgias.  Skin:  Positive for hair loss. Negative for color change, rash and sensitivity to sunlight.   Allergic/Immunologic: Negative for susceptible to infections.  Neurological:  Negative for dizziness and headaches.  Hematological:  Negative for swollen glands.  Psychiatric/Behavioral:  Negative for depressed mood and sleep disturbance. The patient is not nervous/anxious.     PMFS History:  Patient Active Problem List   Diagnosis Date Noted   Parkinson's disease 02/22/2021   Chondromalacia of both patellae 10/04/2016   History of tremor/ Parkinsons dx.  10/04/2016   History of TIA (transient ischemic attack) 10/04/2016   History of hyperlipidemia 10/04/2016   Tremor 03/24/2016   Rheumatoid arthritis involving multiple sites with positive rheumatoid factor ([place]) 03/24/2016   Primary osteoarthritis of both hands 03/24/2016   Primary osteoarthritis of both knees 03/24/2016   Primary osteoarthritis of both feet 03/24/2016   High risk medication use 03/24/2016    Past Medical History:  Diagnosis Date   HTN (hypertension)    Parkinson's disease    rheumatoid arthritis    skin cancer     Family History  Problem Relation Age of Onset   Stroke Mother    Heart attack Father    Rheum arthritis Sister    Leukemia Sister    Lung disease Brother    Dementia Brother    Diabetes Maternal Grandmother    Diabetes Paternal Grandmother    Past Surgical History:  Procedure Laterality Date   ABDOMINAL HYSTERECTOMY     bladder tack     EYE SURGERY Left 11/09/2021   FOOT SURGERY     SKIN CANCER EXCISION  11/2015   SQUAMOUS CELL  CARCINOMA EXCISION  03/2018   right arm   Social History   Social History Narrative   Right handed    Lives with son    Currently retired   Immunization History  Administered Date(s) Administered   [employer] Vaccination 05/23/2019, 06/20/2019     Objective: Vital Signs: BP 139/85 (BP Location: Left Arm, Patient Position: Sitting, Cuff Size: Normal)   Pulse 66   Resp 14   Ht '5\' 1"'$  (1.549 m)   Wt 132 lb (59.9 kg)   BMI 24.94 kg/m     Physical Exam Vitals and nursing note reviewed.  Constitutional:      Appearance: She is well-developed.  HENT:     Head: Normocephalic and atraumatic.  Eyes:     Conjunctiva/sclera: Conjunctivae normal.  Cardiovascular:     Rate and Rhythm: Normal rate and regular rhythm.     Heart sounds: Normal heart sounds.  Pulmonary:     Effort: Pulmonary effort is normal.     Breath sounds: Normal breath sounds.  Abdominal:     General: Bowel sounds are normal.     Palpations: Abdomen is soft.  Musculoskeletal:     Cervical back: Normal range of motion.  Lymphadenopathy:     Cervical: No cervical adenopathy.  Skin:    General: Skin is warm and dry.     Capillary Refill: Capillary refill takes less than 2 seconds.  Neurological:     Mental Status: She is alert and oriented to person, place, and time.  Psychiatric:        Behavior: Behavior normal.      Musculoskeletal Exam: Cervical spine was in good range of motion.  Shoulder joints, elbow joints, wrist joints, MCPs PIPs and DIPs Juengel range of motion.  She had bilateral PIP and DIP thickening with no synovitis.  Hip joints and knee joints in good range of motion.  She had no tenderness over ankles or MTPs.  CDAI Exam: CDAI Score: -- Patient Global: 0 mm; Provider Global: 0 mm Swollen: --; Tender: -- Joint Exam 05/04/2022   No joint exam has been documented for this visit   There is currently no information documented on the homunculus. Go to the Rheumatology activity and complete the homunculus joint exam.  Investigation: No additional findings.  Imaging: No results found.  Recent Labs: Lab Results  Component Value Date   WBC 12.1 (H) 11/15/2021   HGB 13.5 11/15/2021   PLT 275 11/15/2021   NA 134 11/15/2021   K 4.6 11/15/2021   CL 95 (L) 11/15/2021   CO2 24 11/15/2021   GLUCOSE 86 11/15/2021   BUN 8 11/15/2021   CREATININE 0.70 11/15/2021   BILITOT 0.4 11/15/2021   ALKPHOS 94 11/15/2021   AST 18 11/15/2021    ALT 8 11/15/2021   PROT 5.8 (L) 11/15/2021   ALBUMIN 4.2 11/15/2021   CALCIUM 9.0 11/15/2021   GFRAA 90 05/21/2020   QFTBGOLDPLUS NEGATIVE 01/23/2018    Speciality Comments: PLQ Eye Exam: 12/30/2020  WNL @ [facility] up in 1 year  Immunosupressant Lab Work:  TB gold-negative 09/04/13  Hepatitis panel- negative except Hep A IgM antibody 09/04/13  Procedures:  No procedures performed Allergies: Hydrocodone-acetaminophen, Meloxicam, Prednisone, and Gabapentin   Assessment / Plan:     Visit Diagnoses: Rheumatoid arthritis involving multiple sites with positive rheumatoid factor ([place]) -she continues to have some stiffness in her hands.  No synovitis was noted on examination.  X-rays of both hands and both feet  were obtained on 12/19/2019 and did not show any radiographic progression since 2018.  Patient has been tolerating hydroxychloroquine without any side effects.  High risk medication use - Plaquenil 200 mg 1 tablet by mouth twice daily Monday through Friday only. PLQ Eye Exam: 12/30/2020.  Labs obtained on November 15, 2021 CBC and CMP were normal.  Will get labs today.  She has been advised to get labs every 5 months.  An eye examination on an annual basis.- Plan: CBC with Differential/Platelet, COMPLETE METABOLIC PANEL WITH GFR.  Information regarding immunization was placed in the AVS.  Primary osteoarthritis of both hands-she had bilateral PIP and DIP thickening.  Joint protection muscle strengthening was discussed.  Primary osteoarthritis of both knees -she denies any discomfort in her knee joints today.  She had Visco gel injections in February 2022 which provided very minimal relief.  She ambulates with the help of a cane.  Chondromalacia of both patellae-she had good range of motion of bilateral knee joints.  Primary osteoarthritis of both feet-proper fitting shoes were advised.  History of tremor/ Parkinsons dx. she is followed by neurology.  History of TIA (transient  ischemic attack)  History of hyperlipidemia  Osteoporosis screening - patient states that she had DEXA scan through her PCP.  Orders: Orders Placed This Encounter  Procedures   CBC with Differential/Platelet   COMPLETE METABOLIC PANEL WITH GFR   No orders of the defined types were placed in this encounter.   Follow-Up Instructions: Return in about 5 months (around 10/03/2022) for Rheumatoid arthritis, Osteoarthritis.   Bo Merino, MD  Note - This record has been created using Editor, commissioning.  Chart creation errors have been sought, but may not always  have been located. Such creation errors do not reflect on  the standard of medical care.

## 2022-04-25 DIAGNOSIS — M5136 Other intervertebral disc degeneration, lumbar region: Secondary | ICD-10-CM | POA: Diagnosis not present

## 2022-04-25 DIAGNOSIS — M48061 Spinal stenosis, lumbar region without neurogenic claudication: Secondary | ICD-10-CM | POA: Diagnosis not present

## 2022-04-25 DIAGNOSIS — M5416 Radiculopathy, lumbar region: Secondary | ICD-10-CM | POA: Diagnosis not present

## 2022-05-04 ENCOUNTER — Encounter: Payer: Self-pay | Admitting: Rheumatology

## 2022-05-04 ENCOUNTER — Ambulatory Visit: Payer: Medicare HMO | Attending: Rheumatology | Admitting: Rheumatology

## 2022-05-04 VITALS — BP 139/85 | HR 66 | Resp 14 | Ht 61.0 in | Wt 132.0 lb

## 2022-05-04 DIAGNOSIS — Z8639 Personal history of other endocrine, nutritional and metabolic disease: Secondary | ICD-10-CM

## 2022-05-04 DIAGNOSIS — M0579 Rheumatoid arthritis with rheumatoid factor of multiple sites without organ or systems involvement: Secondary | ICD-10-CM | POA: Diagnosis not present

## 2022-05-04 DIAGNOSIS — Z8673 Personal history of transient ischemic attack (TIA), and cerebral infarction without residual deficits: Secondary | ICD-10-CM

## 2022-05-04 DIAGNOSIS — M19041 Primary osteoarthritis, right hand: Secondary | ICD-10-CM

## 2022-05-04 DIAGNOSIS — Z8669 Personal history of other diseases of the nervous system and sense organs: Secondary | ICD-10-CM

## 2022-05-04 DIAGNOSIS — M170 Bilateral primary osteoarthritis of knee: Secondary | ICD-10-CM

## 2022-05-04 DIAGNOSIS — M19071 Primary osteoarthritis, right ankle and foot: Secondary | ICD-10-CM | POA: Diagnosis not present

## 2022-05-04 DIAGNOSIS — M2241 Chondromalacia patellae, right knee: Secondary | ICD-10-CM

## 2022-05-04 DIAGNOSIS — Z13820 Encounter for screening for osteoporosis: Secondary | ICD-10-CM

## 2022-05-04 DIAGNOSIS — Z79899 Other long term (current) drug therapy: Secondary | ICD-10-CM | POA: Diagnosis not present

## 2022-05-04 DIAGNOSIS — M19072 Primary osteoarthritis, left ankle and foot: Secondary | ICD-10-CM

## 2022-05-04 DIAGNOSIS — M2242 Chondromalacia patellae, left knee: Secondary | ICD-10-CM

## 2022-05-04 DIAGNOSIS — M19042 Primary osteoarthritis, left hand: Secondary | ICD-10-CM

## 2022-05-04 NOTE — Patient Instructions (Signed)
Vaccines You are taking a medication(s) that can suppress your immune system.  The following immunizations are recommended: Flu annually Covid-19  Td/Tdap (tetanus, diphtheria, pertussis) every 10 years Pneumonia (Prevnar 15 then Pneumovax 23 at least 1 year apart.  Alternatively, can take Prevnar 20 without needing additional dose) Shingrix: 2 doses from 4 weeks to 6 months apart  Please check with your PCP to make sure you are up to date.

## 2022-05-05 DIAGNOSIS — T402X5A Adverse effect of other opioids, initial encounter: Secondary | ICD-10-CM | POA: Diagnosis not present

## 2022-05-05 DIAGNOSIS — I10 Essential (primary) hypertension: Secondary | ICD-10-CM | POA: Diagnosis not present

## 2022-05-05 DIAGNOSIS — Z79899 Other long term (current) drug therapy: Secondary | ICD-10-CM | POA: Diagnosis not present

## 2022-05-05 DIAGNOSIS — M810 Age-related osteoporosis without current pathological fracture: Secondary | ICD-10-CM | POA: Diagnosis not present

## 2022-05-05 DIAGNOSIS — E559 Vitamin D deficiency, unspecified: Secondary | ICD-10-CM | POA: Diagnosis not present

## 2022-05-05 DIAGNOSIS — K5903 Drug induced constipation: Secondary | ICD-10-CM | POA: Diagnosis not present

## 2022-05-05 DIAGNOSIS — M48061 Spinal stenosis, lumbar region without neurogenic claudication: Secondary | ICD-10-CM | POA: Diagnosis not present

## 2022-05-05 DIAGNOSIS — E871 Hypo-osmolality and hyponatremia: Secondary | ICD-10-CM | POA: Diagnosis not present

## 2022-05-05 DIAGNOSIS — M170 Bilateral primary osteoarthritis of knee: Secondary | ICD-10-CM | POA: Diagnosis not present

## 2022-05-05 LAB — CBC WITH DIFFERENTIAL/PLATELET
Absolute Monocytes: 635 cells/uL (ref 200–950)
Basophils Absolute: 73 cells/uL (ref 0–200)
Basophils Relative: 1 %
Eosinophils Absolute: 219 cells/uL (ref 15–500)
Eosinophils Relative: 3 %
HCT: 41.3 % (ref 35.0–45.0)
Hemoglobin: 14.1 g/dL (ref 11.7–15.5)
Lymphs Abs: 2278 cells/uL (ref 850–3900)
MCH: 29.4 pg (ref 27.0–33.0)
MCHC: 34.1 g/dL (ref 32.0–36.0)
MCV: 86.2 fL (ref 80.0–100.0)
MPV: 10.1 fL (ref 7.5–12.5)
Monocytes Relative: 8.7 %
Neutro Abs: 4095 cells/uL (ref 1500–7800)
Neutrophils Relative %: 56.1 %
Platelets: 294 10*3/uL (ref 140–400)
RBC: 4.79 10*6/uL (ref 3.80–5.10)
RDW: 12.3 % (ref 11.0–15.0)
Total Lymphocyte: 31.2 %
WBC: 7.3 10*3/uL (ref 3.8–10.8)

## 2022-05-05 LAB — COMPLETE METABOLIC PANEL WITH GFR
AG Ratio: 2.1 (calc) (ref 1.0–2.5)
ALT: 10 U/L (ref 6–29)
AST: 22 U/L (ref 10–35)
Albumin: 4.2 g/dL (ref 3.6–5.1)
Alkaline phosphatase (APISO): 91 U/L (ref 37–153)
BUN: 12 mg/dL (ref 7–25)
CO2: 28 mmol/L (ref 20–32)
Calcium: 9.4 mg/dL (ref 8.6–10.4)
Chloride: 99 mmol/L (ref 98–110)
Creat: 0.77 mg/dL (ref 0.60–1.00)
Globulin: 2 g/dL (calc) (ref 1.9–3.7)
Glucose, Bld: 93 mg/dL (ref 65–99)
Potassium: 4.2 mmol/L (ref 3.5–5.3)
Sodium: 137 mmol/L (ref 135–146)
Total Bilirubin: 0.3 mg/dL (ref 0.2–1.2)
Total Protein: 6.2 g/dL (ref 6.1–8.1)
eGFR: 80 mL/min/{1.73_m2} (ref >=60)

## 2022-05-05 NOTE — Progress Notes (Signed)
CBC and CMP are normal.

## 2022-05-11 DIAGNOSIS — Z9849 Cataract extraction status, unspecified eye: Secondary | ICD-10-CM | POA: Diagnosis not present

## 2022-05-11 DIAGNOSIS — H524 Presbyopia: Secondary | ICD-10-CM | POA: Diagnosis not present

## 2022-05-11 DIAGNOSIS — Z961 Presence of intraocular lens: Secondary | ICD-10-CM | POA: Diagnosis not present

## 2022-05-11 DIAGNOSIS — H47233 Glaucomatous optic atrophy, bilateral: Secondary | ICD-10-CM | POA: Diagnosis not present

## 2022-05-11 DIAGNOSIS — H52223 Regular astigmatism, bilateral: Secondary | ICD-10-CM | POA: Diagnosis not present

## 2022-05-11 DIAGNOSIS — Z79899 Other long term (current) drug therapy: Secondary | ICD-10-CM | POA: Diagnosis not present

## 2022-05-11 DIAGNOSIS — H40003 Preglaucoma, unspecified, bilateral: Secondary | ICD-10-CM | POA: Diagnosis not present

## 2022-05-11 DIAGNOSIS — H40013 Open angle with borderline findings, low risk, bilateral: Secondary | ICD-10-CM | POA: Diagnosis not present

## 2022-05-11 DIAGNOSIS — Z0100 Encounter for examination of eyes and vision without abnormal findings: Secondary | ICD-10-CM | POA: Diagnosis not present

## 2022-06-01 ENCOUNTER — Other Ambulatory Visit: Payer: Self-pay | Admitting: Neurology

## 2022-06-08 ENCOUNTER — Other Ambulatory Visit: Payer: Self-pay | Admitting: *Deleted

## 2022-06-08 DIAGNOSIS — M0579 Rheumatoid arthritis with rheumatoid factor of multiple sites without organ or systems involvement: Secondary | ICD-10-CM

## 2022-06-08 MED ORDER — HYDROXYCHLOROQUINE SULFATE 200 MG PO TABS: ORAL_TABLET | ORAL | 0 refills | Status: DC

## 2022-06-08 NOTE — Telephone Encounter (Signed)
Patient called the office and requested a refill on PLQ to be sent to [facility].  Next Visit: 10/04/2022  Last Visit: 05/04/2022  Labs: 05/04/2022 CBC and CMP are normal.   Eye exam: 05/11/2022 WNL    Current Dose per office note 05/04/2022: Plaquenil 200 mg 1 tablet by mouth twice daily Monday through Friday only   [id number] arthritis involving multiple sites with positive rheumatoid factor   Last Fill: 09/22/2021  Okay to refill Plaquenil?

## 2022-06-21 DIAGNOSIS — M170 Bilateral primary osteoarthritis of knee: Secondary | ICD-10-CM | POA: Diagnosis not present

## 2022-07-01 ENCOUNTER — Other Ambulatory Visit: Payer: Self-pay | Admitting: Neurology

## 2022-07-06 DIAGNOSIS — N83209 Unspecified ovarian cyst, unspecified side: Secondary | ICD-10-CM | POA: Diagnosis not present

## 2022-07-27 DIAGNOSIS — N83201 Unspecified ovarian cyst, right side: Secondary | ICD-10-CM | POA: Diagnosis not present

## 2022-07-27 DIAGNOSIS — N83291 Other ovarian cyst, right side: Secondary | ICD-10-CM | POA: Diagnosis not present

## 2022-08-04 DIAGNOSIS — Z79899 Other long term (current) drug therapy: Secondary | ICD-10-CM | POA: Diagnosis not present

## 2022-08-04 DIAGNOSIS — E870 Hyperosmolality and hypernatremia: Secondary | ICD-10-CM | POA: Diagnosis not present

## 2022-08-04 DIAGNOSIS — E871 Hypo-osmolality and hyponatremia: Secondary | ICD-10-CM | POA: Diagnosis not present

## 2022-08-04 DIAGNOSIS — K5903 Drug induced constipation: Secondary | ICD-10-CM | POA: Diagnosis not present

## 2022-08-04 DIAGNOSIS — M48061 Spinal stenosis, lumbar region without neurogenic claudication: Secondary | ICD-10-CM | POA: Diagnosis not present

## 2022-08-04 DIAGNOSIS — E559 Vitamin D deficiency, unspecified: Secondary | ICD-10-CM | POA: Diagnosis not present

## 2022-08-04 DIAGNOSIS — I10 Essential (primary) hypertension: Secondary | ICD-10-CM | POA: Diagnosis not present

## 2022-08-04 DIAGNOSIS — T402X5A Adverse effect of other opioids, initial encounter: Secondary | ICD-10-CM | POA: Diagnosis not present

## 2022-08-04 DIAGNOSIS — F419 Anxiety disorder, unspecified: Secondary | ICD-10-CM | POA: Diagnosis not present

## 2022-08-04 DIAGNOSIS — M810 Age-related osteoporosis without current pathological fracture: Secondary | ICD-10-CM | POA: Diagnosis not present

## 2022-08-04 DIAGNOSIS — E785 Hyperlipidemia, unspecified: Secondary | ICD-10-CM | POA: Diagnosis not present

## 2022-08-05 DIAGNOSIS — H47233 Glaucomatous optic atrophy, bilateral: Secondary | ICD-10-CM | POA: Diagnosis not present

## 2022-08-05 DIAGNOSIS — H40013 Open angle with borderline findings, low risk, bilateral: Secondary | ICD-10-CM | POA: Diagnosis not present

## 2022-08-19 ENCOUNTER — Other Ambulatory Visit: Payer: Self-pay

## 2022-08-19 DIAGNOSIS — M0579 Rheumatoid arthritis with rheumatoid factor of multiple sites without organ or systems involvement: Secondary | ICD-10-CM

## 2022-08-19 MED ORDER — HYDROXYCHLOROQUINE SULFATE 200 MG PO TABS: ORAL_TABLET | ORAL | 0 refills | Status: DC

## 2022-08-19 NOTE — Telephone Encounter (Signed)
Patient contacted the office to state she would like a refill of hydroxychloroquine sent to [facility] in [place].   Last Fill: 06/08/2022  Eye exam: 05/11/2022 WNL   Labs: 05/04/2022 CBC and CMP are normal.   Next Visit: 10/04/2022  Last Visit: 05/04/2022  [id number] arthritis involving multiple sites with positive rheumatoid factor   Current Dose per office note 05/04/2022: Plaquenil 200 mg 1 tablet by mouth twice daily Monday through Friday only   Okay to refill Plaquenil?

## 2022-08-21 ENCOUNTER — Other Ambulatory Visit: Payer: Self-pay | Admitting: Physician Assistant

## 2022-08-21 DIAGNOSIS — M0579 Rheumatoid arthritis with rheumatoid factor of multiple sites without organ or systems involvement: Secondary | ICD-10-CM

## 2022-08-25 ENCOUNTER — Other Ambulatory Visit: Payer: Self-pay | Admitting: Neurology

## 2022-08-25 DIAGNOSIS — G20B2 Parkinson's disease with dyskinesia, with fluctuations: Secondary | ICD-10-CM

## 2022-09-05 NOTE — Progress Notes (Unsigned)
Assessment/Plan:   1.  Parkinsons Disease  -Continue carbidopa/levodopa 25/100, 1 tablet at , 5am/9 AM/1 PM/5 PM  -Continue carbidopa/levodopa 50/200 CR at bedtime  -Continue amantadine 100 mg, 1 at 9 AM and 1 PM  -Follows with dermatology.  -discussed using walker at all times  2.  Insomnia  -Likely related to nocturia.  May need to see urology or urogynecology in the future.   Subjective:   Dawn Harrell was seen today in follow up for Parkinsons disease.  My previous records were reviewed prior to todays visit as well as outside records available to me. Pt with sister who supplements hx.   she continues to take her daytime and bedtime levodopa.  She is also on amantadine.  She has no hallucinations. Current prescribed movement disorder medications:  Carbidopa/levodopa 25/100, 1 tablet at 5am/9 AM/1 PM/5 PM Carbidopa/levodopa 50/200 CR at bedtime  Amantadine, 1 tablet at 9 AM and 1 PM    ALLERGIES:   Allergies  Allergen Reactions   Hydrocodone-Acetaminophen Shortness Of Breath   Meloxicam Nausea And Vomiting and Nausea Only   Prednisone Other (See Comments)   Gabapentin     Swelling     CURRENT MEDICATIONS:  No outpatient medications have been marked as taking for the 09/07/22 encounter (Appointment) with Rudie Rikard, Octaviano Batty, DO.     Objective:   PHYSICAL EXAMINATION:    VITALS:   There were no vitals filed for this visit.    GEN:  The patient appears stated age and is in NAD. HEENT:  Normocephalic, atraumatic.  The mucous membranes are moist. The superficial temporal arteries are without ropiness or tenderness. CV:  RRR Lungs:  CTAB Neck/HEME:  There are no carotid bruits bilaterally.  Neurological examination:  Orientation: The patient is alert and oriented x3. Cranial nerves: There is good facial symmetry with mild facial hypomimia. The speech is fluent and clear. Soft palate rises symmetrically and there is no tongue deviation. Hearing is intact to  conversational tone. Sensation: Sensation is intact to light touch throughout Motor: Strength is at least antigravity x4.  Movement examination: Tone: There is normal tone in the UE/LE Abnormal movements: There is no tremor today.  There is mild dyskinesia on the L.   this is stable Coordination:  There is no significant decremation today. Gait and Station: The patient pushes off of the chair to arise.  She has her cane and is short stepped but does better in the hallway.  I have reviewed and interpreted the following labs independently    Chemistry      Component Value Date/Time   NA 137 05/04/2022 1138   NA 134 11/15/2021 1001   K 4.2 05/04/2022 1138   CL 99 05/04/2022 1138   CO2 28 05/04/2022 1138   BUN 12 05/04/2022 1138   BUN 8 11/15/2021 1001   CREATININE 0.77 05/04/2022 1138      Component Value Date/Time   CALCIUM 9.4 05/04/2022 1138   ALKPHOS 94 11/15/2021 1001   AST 22 05/04/2022 1138   ALT 10 05/04/2022 1138   BILITOT 0.3 05/04/2022 1138   BILITOT 0.4 11/15/2021 1001       Lab Results  Component Value Date   WBC 7.3 05/04/2022   HGB 14.1 05/04/2022   HCT 41.3 05/04/2022   MCV 86.2 05/04/2022   PLT 294 05/04/2022    No results found for: "TSH"  Total time spent on today's visit was *** minutes, including both face-to-face time and nonface-to-face time.  Time included that spent on review of records (prior notes available to me/labs/imaging if pertinent), discussing treatment and goals, answering patient's questions and coordinating care.   Cc:  Hurshel Party, NP

## 2022-09-07 ENCOUNTER — Encounter: Payer: Self-pay | Admitting: Neurology

## 2022-09-07 ENCOUNTER — Ambulatory Visit: Payer: Medicare HMO | Admitting: Neurology

## 2022-09-07 VITALS — BP 122/78 | HR 66 | Ht 61.0 in | Wt 127.0 lb

## 2022-09-07 DIAGNOSIS — G20B2 Parkinson's disease with dyskinesia, with fluctuations: Secondary | ICD-10-CM

## 2022-09-20 NOTE — Progress Notes (Unsigned)
Office Visit Note  Patient: Dawn Harrell             Date of Birth: 05-11-1945           MRN: [id number]             PCP: Hurshel Party, NP Referring: Hurshel Party, NP Visit Date: 10/04/2022 Occupation: @GUAROCC @  Subjective:  Medication monitoring  History of Present Illness: Dawn Harrell is a 77 y.o. female with history of seropositive rheumatoid arthritis and osteoarthritis.  She is taking plaquenil 200 mg 1 tablet by mouth twice daily Monday through Friday.  She is tolerating Plaquenil without any side effects and has not missed any doses recently.  She denies any signs or symptoms of a rheumatoid arthritis flare.  She denies any joint swelling at this time.  She has not noticed any morning stiffness or nocturnal pain.  She denies any difficulty with ADLs.  She has been using a cane to assist with ambulation.  She denies any new medical conditions.  She denies any recent or recurrent infections.    Activities of Daily Living:  Patient reports morning stiffness for 0 minutes.   Patient Denies nocturnal pain.  Difficulty dressing/grooming: Denies Difficulty climbing stairs: Reports Difficulty getting out of chair: Reports Difficulty using hands for taps, buttons, cutlery, and/or writing: Reports  Review of Systems  Constitutional:  Positive for fatigue.  HENT:  Negative for mouth sores and mouth dryness.   Eyes:  Negative for dryness.  Respiratory:  Negative for shortness of breath.   Cardiovascular:  Negative for chest pain and palpitations.  Gastrointestinal:  Negative for blood in stool, constipation and diarrhea.  Endocrine: Negative for increased urination.  Genitourinary:  Negative for involuntary urination.  Musculoskeletal:  Negative for joint pain, gait problem, joint pain, joint swelling, myalgias, muscle weakness, morning stiffness, muscle tenderness and myalgias.  Skin:  Negative for color change, rash, hair loss and sensitivity to sunlight.  Allergic/Immunologic:  Negative for susceptible to infections.  Neurological:  Positive for dizziness. Negative for headaches.  Hematological:  Negative for swollen glands.  Psychiatric/Behavioral:  Negative for depressed mood and sleep disturbance. The patient is not nervous/anxious.     PMFS History:  Patient Active Problem List   Diagnosis Date Noted   Parkinson's disease 02/22/2021   Chondromalacia of both patellae 10/04/2016   History of tremor/ Parkinsons dx.  10/04/2016   History of TIA (transient ischemic attack) 10/04/2016   History of hyperlipidemia 10/04/2016   Tremor 03/24/2016   Rheumatoid arthritis involving multiple sites with positive rheumatoid factor (HCC) 03/24/2016   Primary osteoarthritis of both hands 03/24/2016   Primary osteoarthritis of both knees 03/24/2016   Primary osteoarthritis of both feet 03/24/2016   High risk medication use 03/24/2016    Past Medical History:  Diagnosis Date   HTN (hypertension)    Parkinson's disease    rheumatoid arthritis    skin cancer     Family History  Problem Relation Age of Onset   Stroke Mother    Heart attack Father    Rheum arthritis Sister    Leukemia Sister    Lung disease Brother    Dementia Brother    Diabetes Maternal Grandmother    Diabetes Paternal Grandmother    Past Surgical History:  Procedure Laterality Date   ABDOMINAL HYSTERECTOMY     bladder tack     EYE SURGERY Left 11/09/2021   FOOT SURGERY     SKIN CANCER EXCISION  11/2015  SQUAMOUS CELL CARCINOMA EXCISION  03/2018   right arm   Social History   Social History Narrative   Right handed    Lives with son    Currently retired   Immunization History  Administered Date(s) Administered   [employer] Vaccination 05/23/2019, 06/20/2019     Objective: Vital Signs: BP 139/84 (BP Location: Left Arm, Patient Position: Sitting, Cuff Size: Normal)   Pulse 60   Resp 13   Ht 5' (1.524 m)   Wt 127 lb (57.6 kg)   BMI 24.80 kg/m    Physical  Exam Vitals and nursing note reviewed.  Constitutional:      Appearance: She is well-developed.  HENT:     Head: Normocephalic and atraumatic.  Eyes:     Conjunctiva/sclera: Conjunctivae normal.  Cardiovascular:     Rate and Rhythm: Normal rate and regular rhythm.     Heart sounds: Normal heart sounds.  Pulmonary:     Effort: Pulmonary effort is normal.     Breath sounds: Normal breath sounds.  Abdominal:     General: Bowel sounds are normal.     Palpations: Abdomen is soft.  Musculoskeletal:     Cervical back: Normal range of motion.  Lymphadenopathy:     Cervical: No cervical adenopathy.  Skin:    General: Skin is warm and dry.     Capillary Refill: Capillary refill takes less than 2 seconds.  Neurological:     Mental Status: She is alert and oriented to person, place, and time.  Psychiatric:        Behavior: Behavior normal.      Musculoskeletal Exam: C-spine, thoracic spine, lumbar spine have good range of motion.  Shoulder joints, elbow joints, wrist joints, MCPs, PIPs, DIPs have good range of motion with no synovitis.  PIP and DIP thickening consistent with osteoarthritis of both hands.  Complete fist formation bilaterally.  Hip joints have good range of motion with no groin pain.  Knee joints have good range of motion with no warmth or effusion.  Ankle joints have good range of motion with no tenderness or joint swelling.  CDAI Exam: CDAI Score: -- Patient Global: 20 / 100; Provider Global: 20 / 100 Swollen: --; Tender: -- Joint Exam 10/04/2022   No joint exam has been documented for this visit   There is currently no information documented on the homunculus. Go to the Rheumatology activity and complete the homunculus joint exam.  Investigation: No additional findings.  Imaging: No results found.  Recent Labs: Lab Results  Component Value Date   WBC 7.3 05/04/2022   HGB 14.1 05/04/2022   PLT 294 05/04/2022   NA 137 05/04/2022   K 4.2 05/04/2022   CL 99  05/04/2022   CO2 28 05/04/2022   GLUCOSE 93 05/04/2022   BUN 12 05/04/2022   CREATININE 0.77 05/04/2022   BILITOT 0.3 05/04/2022   ALKPHOS 94 11/15/2021   AST 22 05/04/2022   ALT 10 05/04/2022   PROT 6.2 05/04/2022   ALBUMIN 4.2 11/15/2021   CALCIUM 9.4 05/04/2022   GFRAA 90 05/21/2020   QFTBGOLDPLUS NEGATIVE 01/23/2018    Speciality Comments: PLQ Eye Exam: 05/11/2022 WNL @ [facility] [place] in 1 year  Immunosupressant Lab Work:  TB gold-negative 09/04/13  Hepatitis panel- negative except Hep A IgM antibody 09/04/13  Procedures:  No procedures performed Allergies: Hydrocodone-acetaminophen, Meloxicam, Prednisone, and Gabapentin   Assessment / Plan:     Visit Diagnoses: Rheumatoid arthritis involving multiple sites with  positive rheumatoid factor (HCC): She has no joint tenderness or synovitis on examination today.  She has not had any morning stiffness, nocturnal pain, or difficulty with ADLs.  She has clinically been doing well taking Plaquenil 200 mg 1 tablet by mouth twice daily Monday through Friday.  She is tolerating Plaquenil without any side effects and has not missed any doses recently.  She was advised to notify us if she develops signs or symptoms of a flare.  She will follow-up in the office in 5 months or sooner if needed.  High risk medication use - Plaquenil 200 mg 1 tablet by mouth twice daily Monday through Friday only.  PLQ Eye Exam: 05/11/2022 WNL @ [facility] [place] in 1 year  CBC and CMP updated on 05/04/22. Orders for CBC and CMP released today.   - Plan: CBC with Differential/Platelet, COMPLETE METABOLIC PANEL WITH GFR  Primary osteoarthritis of both hands: She has some PIP and DIP thickening consistent with osteoarthritis of both hands.  No tenderness or inflammation noted on exam.  Complete fist formation noted bilaterally.  Primary osteoarthritis of both knees: She has good range of motion of both knee joints on examination today.  No warmth or  effusion noted.  She has been using a cane to assist with ambulation.  Chondromalacia of both patellae  Primary osteoarthritis of both feet: She is not experiencing any increased discomfort in her feet at this time.  She is good range of motion of both ankle joints with no tenderness or synovitis.  She is wearing proper fitting shoes.  Other medical conditions are listed as follows:  History of tremor/ Parkinsons dx.   History of TIA (transient ischemic attack)  History of hyperlipidemia  Orders: Orders Placed This Encounter  Procedures   CBC with Differential/Platelet   COMPLETE METABOLIC PANEL WITH GFR   No orders of the defined types were placed in this encounter.    Follow-Up Instructions: Return in about 5 months (around 03/06/2023) for Rheumatoid arthritis, Osteoarthritis.   Gearldine Bienenstock, PA-C  Note - This record has been created using Dragon software.  Chart creation errors have been sought, but may not always  have been located. Such creation errors do not reflect on  the standard of medical care.

## 2022-09-26 ENCOUNTER — Other Ambulatory Visit: Payer: Self-pay | Admitting: Neurology

## 2022-09-26 DIAGNOSIS — G20B2 Parkinson's disease with dyskinesia, with fluctuations: Secondary | ICD-10-CM

## 2022-10-04 ENCOUNTER — Ambulatory Visit: Payer: Medicare HMO | Attending: Physician Assistant | Admitting: Physician Assistant

## 2022-10-04 ENCOUNTER — Encounter: Payer: Self-pay | Admitting: Physician Assistant

## 2022-10-04 VITALS — BP 139/84 | HR 60 | Resp 13 | Ht 60.0 in | Wt 127.0 lb

## 2022-10-04 DIAGNOSIS — Z8669 Personal history of other diseases of the nervous system and sense organs: Secondary | ICD-10-CM

## 2022-10-04 DIAGNOSIS — M19041 Primary osteoarthritis, right hand: Secondary | ICD-10-CM

## 2022-10-04 DIAGNOSIS — Z8673 Personal history of transient ischemic attack (TIA), and cerebral infarction without residual deficits: Secondary | ICD-10-CM

## 2022-10-04 DIAGNOSIS — Z8639 Personal history of other endocrine, nutritional and metabolic disease: Secondary | ICD-10-CM | POA: Diagnosis not present

## 2022-10-04 DIAGNOSIS — M2241 Chondromalacia patellae, right knee: Secondary | ICD-10-CM

## 2022-10-04 DIAGNOSIS — M19071 Primary osteoarthritis, right ankle and foot: Secondary | ICD-10-CM

## 2022-10-04 DIAGNOSIS — M170 Bilateral primary osteoarthritis of knee: Secondary | ICD-10-CM | POA: Diagnosis not present

## 2022-10-04 DIAGNOSIS — M19072 Primary osteoarthritis, left ankle and foot: Secondary | ICD-10-CM

## 2022-10-04 DIAGNOSIS — M0579 Rheumatoid arthritis with rheumatoid factor of multiple sites without organ or systems involvement: Secondary | ICD-10-CM | POA: Diagnosis not present

## 2022-10-04 DIAGNOSIS — M19042 Primary osteoarthritis, left hand: Secondary | ICD-10-CM

## 2022-10-04 DIAGNOSIS — Z79899 Other long term (current) drug therapy: Secondary | ICD-10-CM

## 2022-10-04 DIAGNOSIS — M2242 Chondromalacia patellae, left knee: Secondary | ICD-10-CM

## 2022-10-04 LAB — CBC WITH DIFFERENTIAL/PLATELET
Absolute Monocytes: 684 cells/uL (ref 200–950)
Basophils Absolute: 101 cells/uL (ref 0–200)
Basophils Relative: 1.4 %
Eosinophils Absolute: 223 cells/uL (ref 15–500)
Eosinophils Relative: 3.1 %
HCT: 39.6 % (ref 35.0–45.0)
Hemoglobin: 13.3 g/dL (ref 11.7–15.5)
Lymphs Abs: 2477 cells/uL (ref 850–3900)
MCH: 29.3 pg (ref 27.0–33.0)
MCHC: 33.6 g/dL (ref 32.0–36.0)
MCV: 87.2 fL (ref 80.0–100.0)
MPV: 9.8 fL (ref 7.5–12.5)
Monocytes Relative: 9.5 %
Neutro Abs: 3715 cells/uL (ref 1500–7800)
Neutrophils Relative %: 51.6 %
Platelets: 258 10*3/uL (ref 140–400)
RBC: 4.54 10*6/uL (ref 3.80–5.10)
RDW: 13.1 % (ref 11.0–15.0)
Total Lymphocyte: 34.4 %
WBC: 7.2 10*3/uL (ref 3.8–10.8)

## 2022-10-04 LAB — COMPLETE METABOLIC PANEL WITH GFR
AG Ratio: 2.3 (calc) (ref 1.0–2.5)
ALT: 8 U/L (ref 6–29)
AST: 19 U/L (ref 10–35)
Albumin: 4.1 g/dL (ref 3.6–5.1)
Alkaline phosphatase (APISO): 71 U/L (ref 37–153)
BUN: 10 mg/dL (ref 7–25)
CO2: 32 mmol/L (ref 20–32)
Calcium: 9.6 mg/dL (ref 8.6–10.4)
Chloride: 97 mmol/L — ABNORMAL LOW (ref 98–110)
Creat: 0.68 mg/dL (ref 0.60–1.00)
Globulin: 1.8 g/dL (calc) — ABNORMAL LOW (ref 1.9–3.7)
Glucose, Bld: 86 mg/dL (ref 65–99)
Potassium: 3.9 mmol/L (ref 3.5–5.3)
Sodium: 134 mmol/L — ABNORMAL LOW (ref 135–146)
Total Bilirubin: 0.4 mg/dL (ref 0.2–1.2)
Total Protein: 5.9 g/dL — ABNORMAL LOW (ref 6.1–8.1)
eGFR: 90 mL/min/{1.73_m2} (ref >=60)

## 2022-10-04 NOTE — Progress Notes (Signed)
Total protein is low-5.9.  Rest of CMP stable.  We will continue to monitor lab work.

## 2022-10-04 NOTE — Progress Notes (Signed)
CBC WNL

## 2022-11-03 DIAGNOSIS — T402X5A Adverse effect of other opioids, initial encounter: Secondary | ICD-10-CM | POA: Diagnosis not present

## 2022-11-03 DIAGNOSIS — M170 Bilateral primary osteoarthritis of knee: Secondary | ICD-10-CM | POA: Diagnosis not present

## 2022-11-03 DIAGNOSIS — M810 Age-related osteoporosis without current pathological fracture: Secondary | ICD-10-CM | POA: Diagnosis not present

## 2022-11-03 DIAGNOSIS — K5903 Drug induced constipation: Secondary | ICD-10-CM | POA: Diagnosis not present

## 2022-11-03 DIAGNOSIS — E871 Hypo-osmolality and hyponatremia: Secondary | ICD-10-CM | POA: Diagnosis not present

## 2022-11-03 DIAGNOSIS — E559 Vitamin D deficiency, unspecified: Secondary | ICD-10-CM | POA: Diagnosis not present

## 2022-11-03 DIAGNOSIS — M48061 Spinal stenosis, lumbar region without neurogenic claudication: Secondary | ICD-10-CM | POA: Diagnosis not present

## 2022-11-03 DIAGNOSIS — I10 Essential (primary) hypertension: Secondary | ICD-10-CM | POA: Diagnosis not present

## 2022-11-03 DIAGNOSIS — Z79899 Other long term (current) drug therapy: Secondary | ICD-10-CM | POA: Diagnosis not present

## 2022-11-10 DIAGNOSIS — M170 Bilateral primary osteoarthritis of knee: Secondary | ICD-10-CM | POA: Diagnosis not present

## 2022-11-22 ENCOUNTER — Other Ambulatory Visit: Payer: Self-pay | Admitting: Neurology

## 2022-11-22 DIAGNOSIS — G20B2 Parkinson's disease with dyskinesia, with fluctuations: Secondary | ICD-10-CM

## 2022-11-26 ENCOUNTER — Other Ambulatory Visit: Payer: Self-pay | Admitting: Neurology

## 2022-11-26 DIAGNOSIS — G20A1 Parkinson's disease without dyskinesia, without mention of fluctuations: Secondary | ICD-10-CM

## 2022-11-28 ENCOUNTER — Other Ambulatory Visit: Payer: Self-pay | Admitting: *Deleted

## 2022-11-28 DIAGNOSIS — M0579 Rheumatoid arthritis with rheumatoid factor of multiple sites without organ or systems involvement: Secondary | ICD-10-CM

## 2022-11-28 MED ORDER — HYDROXYCHLOROQUINE SULFATE 200 MG PO TABS
ORAL_TABLET | ORAL | 0 refills | Status: DC
Start: 2022-11-28 — End: 2023-02-20

## 2022-11-28 NOTE — Telephone Encounter (Signed)
Last Fill: 08/19/2022  Eye exam: 05/11/2022   Labs: 10/04/2022 Total protein is low-5.9.  Rest of CMP stable.  We will continue to monitor lab work. CBC WNL   Next Visit: 03/06/2023  Last Visit: 10/04/2022  [id number] arthritis involving multiple sites with positive rheumatoid factor   Current Dose per office note 10/04/2022: Plaquenil 200 mg 1 tablet by mouth twice daily Monday through Friday only   Okay to refill Plaquenil?

## 2023-01-23 ENCOUNTER — Telehealth: Payer: Self-pay | Admitting: Neurology

## 2023-01-23 NOTE — Telephone Encounter (Signed)
Pt has appt with Tat in Dec and she states that her Shaking has started back and wants to know what she can do

## 2023-01-24 NOTE — Telephone Encounter (Signed)
Called patient and she is fine with being on a CX list

## 2023-02-10 DIAGNOSIS — H40013 Open angle with borderline findings, low risk, bilateral: Secondary | ICD-10-CM | POA: Diagnosis not present

## 2023-02-10 DIAGNOSIS — H47233 Glaucomatous optic atrophy, bilateral: Secondary | ICD-10-CM | POA: Diagnosis not present

## 2023-02-14 NOTE — Progress Notes (Unsigned)
Assessment/Plan:   1.  Parkinsons Disease with Parkinsons Disease dyskinesia  -Continue carbidopa/levodopa 25/100, 1 tablet at , 5am/9 AM/1 PM/5 PM  -Continue carbidopa/levodopa 50/200 CR at bedtime  -Continue amantadine 100 mg, 1 at 9 AM and 1 PM  -Follows with dermatology.  -she is using the walker at all times  2.  Insomnia  -Likely related to nocturia.  May need to see urology or urogynecology in the future.   Subjective:   Dawn Harrell was seen today in follow up for Parkinsons disease.  My previous records were reviewed prior to todays visit as well as outside records available to me. Pt with sisters who supplements hx.   Overall, she has been doing pretty well until recently when she noticed more tremor, and that is why she was worked in today.  She has had no further falls.  No lightheadedness or near syncope.  No hallucinations.  She has been following with sports medicine for injections in the knees.  Last injection was July 25.  Notes are reviewed.  Current prescribed movement disorder medications:  Carbidopa/levodopa 25/100, 1 tablet at 5am/9 AM/1 PM/5 PM Carbidopa/levodopa 50/200 CR at bedtime  Amantadine, 1 tablet at 9 AM and 1 PM   ALLERGIES:   Allergies  Allergen Reactions   Hydrocodone-Acetaminophen Shortness Of Breath   Meloxicam Nausea And Vomiting and Nausea Only   Prednisone Other (See Comments)   Gabapentin     Swelling     CURRENT MEDICATIONS:  No outpatient medications have been marked as taking for the 02/16/23 encounter (Appointment) with Ezrah Dembeck, Octaviano Batty, DO.     Objective:   PHYSICAL EXAMINATION:    VITALS:   There were no vitals filed for this visit.  GEN:  The patient appears stated age and is in NAD. HEENT:  Normocephalic, atraumatic.  The mucous membranes are moist. The superficial temporal arteries are without ropiness or tenderness. CV:  RRR Lungs:  CTAB Neck/HEME:  There are no carotid bruits bilaterally.  Neurological  examination:  Orientation: The patient is alert and oriented x3. Cranial nerves: There is good facial symmetry with mild facial hypomimia. The speech is fluent and clear. Soft palate rises symmetrically and there is no tongue deviation. Hearing is intact to conversational tone. Sensation: Sensation is intact to light touch throughout Motor: Strength is at least antigravity x4.  Movement examination: Tone: There is normal tone in the UE/LE Abnormal movements: There is no tremor today.  There is mild to mod dyskinesia on the L.   this is stable Coordination:  There is no significant decremation today. Gait and Station: The patient pushes off of the chair to arise.  She walks very well with the walker.    I have reviewed and interpreted the following labs independently    Chemistry      Component Value Date/Time   NA 134 (L) 10/04/2022 0954   NA 134 11/15/2021 1001   K 3.9 10/04/2022 0954   CL 97 (L) 10/04/2022 0954   CO2 32 10/04/2022 0954   BUN 10 10/04/2022 0954   BUN 8 11/15/2021 1001   CREATININE 0.68 10/04/2022 0954      Component Value Date/Time   CALCIUM 9.6 10/04/2022 0954   ALKPHOS 94 11/15/2021 1001   AST 19 10/04/2022 0954   ALT 8 10/04/2022 0954   BILITOT 0.4 10/04/2022 0954   BILITOT 0.4 11/15/2021 1001       Lab Results  Component Value Date   WBC 7.2 10/04/2022  HGB 13.3 10/04/2022   HCT 39.6 10/04/2022   MCV 87.2 10/04/2022   PLT 258 10/04/2022    No results found for: "TSH"  Total time spent on today's visit was *** minutes, including both face-to-face time and nonface-to-face time.  Time included that spent on review of records (prior notes available to me/labs/imaging if pertinent), discussing treatment and goals, answering patient's questions and coordinating care.   Cc:  Hurshel Party, NP

## 2023-02-15 DIAGNOSIS — N83201 Unspecified ovarian cyst, right side: Secondary | ICD-10-CM | POA: Diagnosis not present

## 2023-02-16 ENCOUNTER — Encounter: Payer: Self-pay | Admitting: Neurology

## 2023-02-16 ENCOUNTER — Ambulatory Visit: Payer: Medicare HMO | Admitting: Neurology

## 2023-02-16 VITALS — BP 128/72 | HR 81 | Ht 60.0 in | Wt 124.0 lb

## 2023-02-16 DIAGNOSIS — R296 Repeated falls: Secondary | ICD-10-CM

## 2023-02-16 DIAGNOSIS — G20B2 Parkinson's disease with dyskinesia, with fluctuations: Secondary | ICD-10-CM | POA: Diagnosis not present

## 2023-02-16 DIAGNOSIS — R2689 Other abnormalities of gait and mobility: Secondary | ICD-10-CM

## 2023-02-16 DIAGNOSIS — G20A1 Parkinson's disease without dyskinesia, without mention of fluctuations: Secondary | ICD-10-CM

## 2023-02-16 DIAGNOSIS — G4752 REM sleep behavior disorder: Secondary | ICD-10-CM

## 2023-02-16 MED ORDER — CARBIDOPA-LEVODOPA 25-100 MG PO TABS
ORAL_TABLET | ORAL | 1 refills | Status: DC
Start: 2023-02-16 — End: 2023-08-11

## 2023-02-16 MED ORDER — CARBIDOPA-LEVODOPA ER 50-200 MG PO TBCR
1.0000 | EXTENDED_RELEASE_TABLET | Freq: Every day | ORAL | 1 refills | Status: DC
Start: 2023-02-16 — End: 2023-08-11

## 2023-02-16 MED ORDER — AMANTADINE HCL 100 MG PO CAPS
100.0000 mg | ORAL_CAPSULE | Freq: Two times a day (BID) | ORAL | 1 refills | Status: DC
Start: 2023-02-16 — End: 2023-08-11

## 2023-02-16 NOTE — Patient Instructions (Signed)
HOLD the dicyclomine.  It can cause loss of balance.  If you have GI upset after we hold it, please see if your pcp can give you something different We will send you to [facility] for PT Would you ask [facility] tomorrow if she would mind RX the klonopin, 0.5 mg, 1/2 tabet at bedtime.  I believe you have a narcotic/controlled substance agreement with her.    The physicians and staff at [facility] are committed to providing excellent care. You may receive a survey requesting feedback about your experience at our office. We strive to receive "very good" responses to the survey questions. If you feel that your experience would prevent you from giving the office a "very good " response, please contact our office to try to remedy the situation. We may be reached at [phone]. Thank you for taking the time out of your busy day to complete the survey.

## 2023-02-17 DIAGNOSIS — K5903 Drug induced constipation: Secondary | ICD-10-CM | POA: Diagnosis not present

## 2023-02-17 DIAGNOSIS — E559 Vitamin D deficiency, unspecified: Secondary | ICD-10-CM | POA: Diagnosis not present

## 2023-02-17 DIAGNOSIS — M810 Age-related osteoporosis without current pathological fracture: Secondary | ICD-10-CM | POA: Diagnosis not present

## 2023-02-17 DIAGNOSIS — R634 Abnormal weight loss: Secondary | ICD-10-CM | POA: Diagnosis not present

## 2023-02-17 DIAGNOSIS — T402X5A Adverse effect of other opioids, initial encounter: Secondary | ICD-10-CM | POA: Diagnosis not present

## 2023-02-17 DIAGNOSIS — M170 Bilateral primary osteoarthritis of knee: Secondary | ICD-10-CM | POA: Diagnosis not present

## 2023-02-17 DIAGNOSIS — E538 Deficiency of other specified B group vitamins: Secondary | ICD-10-CM | POA: Diagnosis not present

## 2023-02-17 DIAGNOSIS — I10 Essential (primary) hypertension: Secondary | ICD-10-CM | POA: Diagnosis not present

## 2023-02-17 DIAGNOSIS — Z79899 Other long term (current) drug therapy: Secondary | ICD-10-CM | POA: Diagnosis not present

## 2023-02-17 DIAGNOSIS — M48061 Spinal stenosis, lumbar region without neurogenic claudication: Secondary | ICD-10-CM | POA: Diagnosis not present

## 2023-02-17 DIAGNOSIS — E871 Hypo-osmolality and hyponatremia: Secondary | ICD-10-CM | POA: Diagnosis not present

## 2023-02-20 ENCOUNTER — Other Ambulatory Visit: Payer: Self-pay | Admitting: Physician Assistant

## 2023-02-20 DIAGNOSIS — M0579 Rheumatoid arthritis with rheumatoid factor of multiple sites without organ or systems involvement: Secondary | ICD-10-CM

## 2023-02-20 DIAGNOSIS — M2041 Other hammer toe(s) (acquired), right foot: Secondary | ICD-10-CM | POA: Diagnosis not present

## 2023-02-20 DIAGNOSIS — T8484XA Pain due to internal orthopedic prosthetic devices, implants and grafts, initial encounter: Secondary | ICD-10-CM | POA: Diagnosis not present

## 2023-02-20 NOTE — Telephone Encounter (Signed)
Last Fill: 11/28/2022  Eye exam: 05/11/2022 WNL    Labs: 10/04/2022 CBC WNL Total protein is low-5.9.  Rest of CMP stable   Next Visit: 03/06/2023  Last Visit: 10/04/2022  DX: Rheumatoid arthritis involving multiple sites with positive rheumatoid factor   Current Dose per office note 10/04/2022: Plaquenil 200 mg 1 tablet by mouth twice daily Monday through Friday only   Patient to update labs at upcoming appointment on 03/06/2023.   Okay to refill Plaquenil?

## 2023-02-20 NOTE — Progress Notes (Signed)
Office Visit Note  Patient: Dawn Harrell             Date of Birth: April 19, 1945           MRN: [id number]             PCP: Hurshel Party, NP Referring: Hurshel Party, NP Visit Date: 03/06/2023 Occupation: @GUAROCC @  Subjective:  Medication monitoring  History of Present Illness: Jordyan Sartell is a 77 y.o. female with history of seropositive rheumatoid arthritis and osteoarthritis. Patient remains on Plaquenil 200 mg 1 tablet by mouth twice daily Monday through Friday only.  She is tolerating Plaquenil without any side effects and has not had any gaps in therapy.  She denies any signs or symptoms of a rheumatoid arthritis flare.  She has not been experiencing any morning stiffness.  She denies any joint swelling at this time.  She has occasional discomfort in her right knee and has been using a rollator walker or cane to assist with ambulation.  At times she has difficulty with balance.     Activities of Daily Living:  Patient reports morning stiffness for 0 minutes.   Patient Reports nocturnal pain.  Difficulty dressing/grooming: Denies Difficulty climbing stairs: Reports Difficulty getting out of chair: Reports Difficulty using hands for taps, buttons, cutlery, and/or writing: Reports  Review of Systems  Constitutional:  Positive for fatigue.  HENT:  Positive for mouth dryness. Negative for mouth sores and nose dryness.   Eyes:  Negative for pain, visual disturbance and dryness.  Respiratory:  Negative for cough, hemoptysis, shortness of breath and difficulty breathing.   Cardiovascular:  Negative for chest pain, palpitations, hypertension and swelling in legs/feet.  Gastrointestinal:  Negative for blood in stool, constipation and diarrhea.  Endocrine: Negative for increased urination.  Genitourinary:  Negative for painful urination.  Musculoskeletal:  Positive for joint pain, joint pain, joint swelling and muscle weakness. Negative for myalgias, morning stiffness, muscle  tenderness and myalgias.  Skin:  Positive for hair loss. Negative for color change, pallor, rash, nodules/bumps, skin tightness, ulcers and sensitivity to sunlight.  Allergic/Immunologic: Negative for susceptible to infections.  Neurological:  Negative for dizziness, numbness, headaches and weakness.  Hematological:  Negative for swollen glands.  Psychiatric/Behavioral:  Positive for sleep disturbance. Negative for depressed mood. The patient is not nervous/anxious.     PMFS History:  Patient Active Problem List   Diagnosis Date Noted   Parkinson's disease (HCC) 02/22/2021   Chondromalacia of both patellae 10/04/2016   History of tremor/ Parkinsons dx.  10/04/2016   History of TIA (transient ischemic attack) 10/04/2016   History of hyperlipidemia 10/04/2016   Tremor 03/24/2016   Rheumatoid arthritis involving multiple sites with positive rheumatoid factor (HCC) 03/24/2016   Primary osteoarthritis of both hands 03/24/2016   Primary osteoarthritis of both knees 03/24/2016   Primary osteoarthritis of both feet 03/24/2016   High risk medication use 03/24/2016    Past Medical History:  Diagnosis Date   HTN (hypertension)    Parkinson's disease (HCC)    rheumatoid arthritis    skin cancer     Family History  Problem Relation Age of Onset   Stroke Mother    Heart attack Father    Rheum arthritis Sister    Leukemia Sister    Lung disease Brother    Dementia Brother    Diabetes Maternal Grandmother    Diabetes Paternal Grandmother    Past Surgical History:  Procedure Laterality Date   ABDOMINAL HYSTERECTOMY  bladder tack     EYE SURGERY Left 11/09/2021   FOOT SURGERY     SKIN CANCER EXCISION  11/2015   SQUAMOUS CELL CARCINOMA EXCISION  03/2018   right arm   Social History   Social History Narrative   Right handed    Lives with son    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  Constitutional:      Appearance: She is well-developed.  HENT:     Head: Normocephalic and atraumatic.  Eyes:     Conjunctiva/sclera: Conjunctivae normal.  Cardiovascular:     Rate and Rhythm: Normal rate and regular rhythm.     Heart sounds: Normal heart sounds.  Pulmonary:     Effort: Pulmonary effort is normal.     Breath sounds: Normal breath sounds.  Abdominal:     General: Bowel sounds are normal.     Palpations: Abdomen is soft.  Musculoskeletal:     Cervical back: Normal range of motion.  Lymphadenopathy:     Cervical: No cervical adenopathy.  Skin:    General: Skin is warm and dry.     Capillary Refill: Capillary refill takes less than 2 seconds.  Neurological:     Mental Status: She is alert and oriented to person, place, and time.  Psychiatric:        Behavior: Behavior normal.      Musculoskeletal Exam: C-spine has good range of motion.  Thoracic kyphosis noted.  Shoulder joints, elbow joints, wrist joints, MCPs, PIPs, DIPs have good range of motion with no synovitis.  PIP and DIP thickening consistent with osteoarthritis of both hands.  Hip joints have good range of motion with no groin pain.  Knee joints have good range of motion with no warmth or effusion.  Ankle joints have good range of motion with no tenderness or joint swelling.  CDAI Exam: CDAI Score: -- Patient Global: --; Provider Global: -- Swollen: --; Tender: -- Joint Exam 03/06/2023   No joint exam has been documented for this visit   There is currently no information documented on the homunculus. Go to the Rheumatology activity and complete the homunculus joint exam.  Investigation: No additional findings.  Imaging: No results  found.  Recent Labs: Lab Results  Component Value Date   WBC 7.2 10/04/2022   HGB 13.3 10/04/2022   PLT 258 10/04/2022   NA 134 (L) 10/04/2022   K 3.9 10/04/2022   CL 97 (L) 10/04/2022   CO2 32 10/04/2022   GLUCOSE 86 10/04/2022   BUN 10 10/04/2022   CREATININE 0.68 10/04/2022   BILITOT 0.4 10/04/2022   ALKPHOS 94 11/15/2021   AST 19 10/04/2022   ALT 8 10/04/2022   PROT 5.9 (L) 10/04/2022   ALBUMIN 4.2 11/15/2021   CALCIUM 9.6 10/04/2022   GFRAA 90 05/21/2020   QFTBGOLDPLUS NEGATIVE 01/23/2018    Speciality Comments: PLQ Eye Exam: 05/11/2022 WNL @ [facility] [place] in 1 year  Immunosupressant Lab Work:  TB gold-negative 09/04/13  Hepatitis panel- negative except Hep A IgM antibody 09/04/13  Procedures:  No procedures performed Allergies: Hydrocodone-acetaminophen,  Meloxicam, Prednisone, and Gabapentin    Assessment / Plan:     Visit Diagnoses: Rheumatoid arthritis involving multiple sites with positive rheumatoid factor (HCC): No joint tenderness or synovitis on examination today.  She has not had any signs or symptoms of a rheumatoid arthritis flare.  She has clinically been doing well taking Plaquenil 200 mg 1 tablet by mouth twice daily Monday through Friday.  She has not been experiencing any morning stiffness or joint swelling.  She will remain on Plaquenil as monotherapy.  She was advised to notify us if she develops signs or symptoms of a flare.  She will follow-up in the office in 5 months or sooner if needed.  High risk medication use - Plaquenil 200 mg 1 tablet by mouth twice daily Monday through Friday only.  PLQ Eye Exam: 05/11/2022 WNL @ [facility] [place] in 1 year.  She is scheduled to update a Plaquenil eye examination in February 2025. CBC and CMP updated on 10/04/22.  Orders for CBC and CMP released today.  She will continue to require updated lab work every 5 months. - Plan: CBC with Differential/Platelet, COMPLETE METABOLIC PANEL WITH  GFR  Primary osteoarthritis of both hands: She has PIP and DIP thickening consistent with osteoarthritis of both hands.  CMC joint prominence noted bilaterally.  Complete fist formation noted.  No synovitis noted.  Primary osteoarthritis of both knees: Patient experiences intermittent discomfort in her right knee joint.  She has been using a cane or rollator walker to assist with ambulation.  On examination today she has mild warmth but no effusion noted.  Bilateral knee crepitus noted.  She uses blue emu topically for pain relief.  She has not needed to use her knee brace recently.  Chondromalacia of both patellae: Good range of motion with some crepitus noted on exam.  Primary osteoarthritis of both feet: She has good range of motion of both ankle joints with no tenderness or joint swelling.  Other medical conditions are listed as follows:  History of tremor/ Parkinsons dx.   History of TIA (transient ischemic attack)  History of hyperlipidemia  Orders: Orders Placed This Encounter  Procedures   CBC with Differential/Platelet   COMPLETE METABOLIC PANEL WITH GFR   No orders of the defined types were placed in this encounter.     Follow-Up Instructions: Return in about 5 months (around 08/04/2023) for Rheumatoid arthritis, Osteoarthritis.   Gearldine Bienenstock, PA-C  Note - This record has been created using Dragon software.  Chart creation errors have been sought, but may not always  have been located. Such creation errors do not reflect on  the standard of medical care.

## 2023-03-01 DIAGNOSIS — I10 Essential (primary) hypertension: Secondary | ICD-10-CM | POA: Diagnosis not present

## 2023-03-01 DIAGNOSIS — M5481 Occipital neuralgia: Secondary | ICD-10-CM | POA: Diagnosis not present

## 2023-03-06 ENCOUNTER — Encounter: Payer: Self-pay | Admitting: Physician Assistant

## 2023-03-06 ENCOUNTER — Ambulatory Visit: Payer: Medicare HMO | Attending: Physician Assistant | Admitting: Physician Assistant

## 2023-03-06 VITALS — BP 125/79 | HR 67 | Resp 13 | Ht 60.0 in | Wt 121.2 lb

## 2023-03-06 DIAGNOSIS — Z8673 Personal history of transient ischemic attack (TIA), and cerebral infarction without residual deficits: Secondary | ICD-10-CM

## 2023-03-06 DIAGNOSIS — M19041 Primary osteoarthritis, right hand: Secondary | ICD-10-CM | POA: Diagnosis not present

## 2023-03-06 DIAGNOSIS — Z8669 Personal history of other diseases of the nervous system and sense organs: Secondary | ICD-10-CM

## 2023-03-06 DIAGNOSIS — Z79899 Other long term (current) drug therapy: Secondary | ICD-10-CM | POA: Diagnosis not present

## 2023-03-06 DIAGNOSIS — M2241 Chondromalacia patellae, right knee: Secondary | ICD-10-CM

## 2023-03-06 DIAGNOSIS — M19072 Primary osteoarthritis, left ankle and foot: Secondary | ICD-10-CM

## 2023-03-06 DIAGNOSIS — M170 Bilateral primary osteoarthritis of knee: Secondary | ICD-10-CM | POA: Diagnosis not present

## 2023-03-06 DIAGNOSIS — M2242 Chondromalacia patellae, left knee: Secondary | ICD-10-CM

## 2023-03-06 DIAGNOSIS — Z8639 Personal history of other endocrine, nutritional and metabolic disease: Secondary | ICD-10-CM | POA: Diagnosis not present

## 2023-03-06 DIAGNOSIS — M0579 Rheumatoid arthritis with rheumatoid factor of multiple sites without organ or systems involvement: Secondary | ICD-10-CM | POA: Diagnosis not present

## 2023-03-06 DIAGNOSIS — M19042 Primary osteoarthritis, left hand: Secondary | ICD-10-CM

## 2023-03-06 DIAGNOSIS — M19071 Primary osteoarthritis, right ankle and foot: Secondary | ICD-10-CM

## 2023-03-07 DIAGNOSIS — Z139 Encounter for screening, unspecified: Secondary | ICD-10-CM | POA: Diagnosis not present

## 2023-03-07 DIAGNOSIS — G43909 Migraine, unspecified, not intractable, without status migrainosus: Secondary | ICD-10-CM | POA: Diagnosis not present

## 2023-03-07 DIAGNOSIS — G20A1 Parkinson's disease without dyskinesia, without mention of fluctuations: Secondary | ICD-10-CM | POA: Diagnosis not present

## 2023-03-07 LAB — CBC WITH DIFFERENTIAL/PLATELET
Absolute Lymphocytes: 2042 {cells}/uL (ref 850–3900)
Absolute Monocytes: 705 {cells}/uL (ref 200–950)
Basophils Absolute: 74 {cells}/uL (ref 0–200)
Basophils Relative: 0.9 %
Eosinophils Absolute: 172 {cells}/uL (ref 15–500)
Eosinophils Relative: 2.1 %
HCT: 41.9 % (ref 35.0–45.0)
Hemoglobin: 13.9 g/dL (ref 11.7–15.5)
MCH: 28.8 pg (ref 27.0–33.0)
MCHC: 33.2 g/dL (ref 32.0–36.0)
MCV: 86.9 fL (ref 80.0–100.0)
MPV: 10.2 fL (ref 7.5–12.5)
Monocytes Relative: 8.6 %
Neutro Abs: 5207 {cells}/uL (ref 1500–7800)
Neutrophils Relative %: 63.5 %
Platelets: 281 10*3/uL (ref 140–400)
RBC: 4.82 10*6/uL (ref 3.80–5.10)
RDW: 12.3 % (ref 11.0–15.0)
Total Lymphocyte: 24.9 %
WBC: 8.2 10*3/uL (ref 3.8–10.8)

## 2023-03-07 LAB — COMPLETE METABOLIC PANEL WITH GFR
AG Ratio: 2.3 (calc) (ref 1.0–2.5)
ALT: 9 U/L (ref 6–29)
AST: 17 U/L (ref 10–35)
Albumin: 4.2 g/dL (ref 3.6–5.1)
Alkaline phosphatase (APISO): 73 U/L (ref 37–153)
BUN: 19 mg/dL (ref 7–25)
CO2: 30 mmol/L (ref 20–32)
Calcium: 9.5 mg/dL (ref 8.6–10.4)
Chloride: 99 mmol/L (ref 98–110)
Creat: 0.69 mg/dL (ref 0.60–1.00)
Globulin: 1.8 g/dL — ABNORMAL LOW (ref 1.9–3.7)
Glucose, Bld: 79 mg/dL (ref 65–99)
Potassium: 4.2 mmol/L (ref 3.5–5.3)
Sodium: 136 mmol/L (ref 135–146)
Total Bilirubin: 0.6 mg/dL (ref 0.2–1.2)
Total Protein: 6 g/dL — ABNORMAL LOW (ref 6.1–8.1)
eGFR: 90 mL/min/{1.73_m2} (ref >=60)

## 2023-03-07 NOTE — Progress Notes (Signed)
CBC WNL  Total protein is borderline low. Globulin is borderline low but stable. We will continue to monitor.  Rest of CMP WNL

## 2023-03-13 DIAGNOSIS — T8484XA Pain due to internal orthopedic prosthetic devices, implants and grafts, initial encounter: Secondary | ICD-10-CM | POA: Diagnosis not present

## 2023-03-28 ENCOUNTER — Ambulatory Visit: Payer: Medicare HMO | Admitting: Neurology

## 2023-03-31 DIAGNOSIS — T84213A Breakdown (mechanical) of internal fixation device of bones of foot and toes, initial encounter: Secondary | ICD-10-CM | POA: Diagnosis not present

## 2023-03-31 DIAGNOSIS — T8484XA Pain due to internal orthopedic prosthetic devices, implants and grafts, initial encounter: Secondary | ICD-10-CM | POA: Diagnosis not present

## 2023-03-31 DIAGNOSIS — Z419 Encounter for procedure for purposes other than remedying health state, unspecified: Secondary | ICD-10-CM | POA: Diagnosis not present

## 2023-04-04 DIAGNOSIS — Z9889 Other specified postprocedural states: Secondary | ICD-10-CM | POA: Diagnosis not present

## 2023-04-17 DIAGNOSIS — Z9889 Other specified postprocedural states: Secondary | ICD-10-CM | POA: Diagnosis not present

## 2023-05-17 ENCOUNTER — Other Ambulatory Visit: Payer: Self-pay | Admitting: Physician Assistant

## 2023-05-17 DIAGNOSIS — M0579 Rheumatoid arthritis with rheumatoid factor of multiple sites without organ or systems involvement: Secondary | ICD-10-CM

## 2023-05-17 NOTE — Telephone Encounter (Signed)
Last Fill: 02/20/2023  Eye exam: 05/15/2023 WNL    Labs: 03/06/2023 CBC WNL Total protein is borderline low. Globulin is borderline low but stable.  Rest of CMP WNL   Next Visit: 07/31/2023  Last Visit: 03/06/2023  NW:GNFAOZHYQM arthritis involving multiple sites with positive rheumatoid factor   Current Dose per office note 03/06/2023: Plaquenil 200 mg 1 tablet by mouth twice daily Monday through Friday only.   Okay to refill Plaquenil?

## 2023-06-16 NOTE — Progress Notes (Signed)
 Assessment/Plan:   1.  Parkinsons Disease with Parkinsons Disease dyskinesia  -Continue carbidopa/levodopa 25/100, 1 tablet at , 5am/9 AM/1 PM/5 PM.  Looks at tad underdosed but it is time for medication so I didn't change anything today  -Continue carbidopa/levodopa 50/200 CR at bedtime  -Continue amantadine 100 mg, 1 at 9 AM and 1 PM  -Follows with dermatology.  2.  Insomnia  -Likely related to nocturia.  May need to see urology or urogynecology in the future.  3.  REM behavior disorder  -This is commonly associated with PD and the patient is experiencing this.  We discussed that this can be very serious and even harmful.  We talked about medications as well as physical barriers to put in the bed (particularly soft bed rails, pillow barriers).  We talked about moving the night stand so that it is not so close to the side of the bed.   She   4.  Chronic pain  -on oxycodone    Subjective:   Dawn Harrell was seen today in follow up for Parkinsons disease.  My previous records were reviewed prior to todays visit as well as outside records available to me. Pt with sister who supplements hx.   last visit, asked the patient to hold the dicyclomine, which she was on for a remote history of GI upset, but had had no issues for years.  The medication is anticholinergic and I wanted to see how she did without it and told her if she had GI upset without it, to follow-up with her primary care to see if they could prescribe something different.  She reports today that she got off of it and she did fine with that.  "I'm constipated and I'm not sure why I was still on it."  She had a fall going up the ramp.  She had let go of the walker.  Few other near falls in the turns.  Separately, she remains on levodopa and has no problems with that medication.  We did discuss last visit using her walker at all times.  She did have surgery on her right foot at the end of December -had hardware removed.     Current prescribed movement disorder medications:  Carbidopa/levodopa 25/100, 1 tablet at 5am/9 AM/1 PM/5 PM Carbidopa/levodopa 50/200 CR at bedtime  Amantadine, 1 tablet at 9 AM and 1 PM   ALLERGIES:   Allergies  Allergen Reactions   Hydrocodone-Acetaminophen Shortness Of Breath   Meloxicam Nausea And Vomiting and Nausea Only   Prednisone Other (See Comments)   Gabapentin     Swelling     CURRENT MEDICATIONS:  Current Meds  Medication Sig   amLODipine (NORVASC) 2.5 MG tablet Take by mouth.     Objective:   PHYSICAL EXAMINATION:    VITALS:   Vitals:   06/20/23 0855  BP: (!) 140/80  Pulse: 71  SpO2: 99%  Weight: 123 lb (55.8 kg)     GEN:  The patient appears stated age and is in NAD. HEENT:  Normocephalic, atraumatic.  The mucous membranes are moist. The superficial temporal arteries are without ropiness or tenderness. CV:  RRR Lungs:  CTAB Neck/HEME:  There are no carotid bruits bilaterally.  Neurological examination:  Orientation: The patient is alert and oriented x3. Cranial nerves: There is good facial symmetry with mild facial hypomimia. The speech is fluent and clear. Soft palate rises symmetrically and there is no tongue deviation. Hearing is intact to conversational tone. Sensation: Sensation  is intact to light touch throughout Motor: Strength is at least antigravity x4.  Movement examination: Tone: There is mild increased tone in the RUE (due for medication) Abnormal movements: There is RUE rest tremor Coordination:  There is no significant decremation today. Gait and Station: The patient can arise without the use of her hands.  She walks very well with the walker (she was actually running in the hallway trying to catch up with the examiner)  I have reviewed and interpreted the following labs independently    Chemistry      Component Value Date/Time   NA 136 03/06/2023 0936   NA 134 11/15/2021 1001   K 4.2 03/06/2023 0936   CL 99 03/06/2023  0936   CO2 30 03/06/2023 0936   BUN 19 03/06/2023 0936   BUN 8 11/15/2021 1001   CREATININE 0.69 03/06/2023 0936      Component Value Date/Time   CALCIUM 9.5 03/06/2023 0936   ALKPHOS 94 11/15/2021 1001   AST 17 03/06/2023 0936   ALT 9 03/06/2023 0936   BILITOT 0.6 03/06/2023 0936   BILITOT 0.4 11/15/2021 1001       Lab Results  Component Value Date   WBC 8.2 03/06/2023   HGB 13.9 03/06/2023   HCT 41.9 03/06/2023   MCV 86.9 03/06/2023   PLT 281 03/06/2023    No results found for: "TSH"  Total time spent on today's visit was 30 minutes, including both face-to-face time and nonface-to-face time.  Time included that spent on review of records (prior notes available to me/labs/imaging if pertinent), discussing treatment and goals, answering patient's questions and coordinating care.   Cc:  Hurshel Party, NP

## 2023-06-20 ENCOUNTER — Encounter: Payer: Self-pay | Admitting: Neurology

## 2023-06-20 ENCOUNTER — Ambulatory Visit: Payer: Medicare HMO | Admitting: Neurology

## 2023-06-20 VITALS — BP 140/80 | HR 71 | Wt 123.0 lb

## 2023-06-20 DIAGNOSIS — G4752 REM sleep behavior disorder: Secondary | ICD-10-CM | POA: Diagnosis not present

## 2023-06-20 DIAGNOSIS — G20B2 Parkinson's disease with dyskinesia, with fluctuations: Secondary | ICD-10-CM | POA: Diagnosis not present

## 2023-07-17 NOTE — Progress Notes (Unsigned)
 Office Visit Note  Patient: Dawn Harrell             Date of Birth: [birth year]           MRN: [id number]             PCP: Olga Berthold, NP Referring: Olga Berthold, NP Visit Date: 07/31/2023 Occupation: @GUAROCC @  Subjective:  Medication monitoring  History of Present Illness: Dawn Harrell is a 78 y.o. female with history of seropositive rheumatoid arthritis and osteoarthritis.  Patient remains on  Plaquenil 200 mg 1 tablet by mouth twice daily Monday through Friday only.  She is tolerating Plaquenil without any side effects and has not had any recent  gaps in therapy.  She denies any signs or symptoms of a rheumatoid arthritis flare.  She has not had any morning stiffness, nocturnal pain, or difficulty with ADLs.  She has been using a rollator walker or cane to assist with ambulation.  At times she has a history of balance and has occasional falls but no recent injuries.   Activities of Daily Living:  Patient reports morning stiffness for 0 minutes.   Patient Denies nocturnal pain.  Difficulty dressing/grooming: Denies Difficulty climbing stairs: Reports Difficulty getting out of chair: Reports Difficulty using hands for taps, buttons, cutlery, and/or writing: Reports  Review of Systems  Constitutional:  Positive for fatigue.  HENT:  Negative for mouth sores, mouth dryness and nose dryness.   Eyes:  Negative for pain and dryness.  Respiratory:  Negative for shortness of breath and difficulty breathing.   Cardiovascular:  Negative for chest pain and palpitations.  Gastrointestinal:  Positive for constipation. Negative for blood in stool and diarrhea.  Endocrine: Negative for increased urination.  Genitourinary:  Negative for involuntary urination.  Musculoskeletal:  Positive for gait problem and muscle weakness. Negative for joint pain, joint pain, joint swelling, myalgias, morning stiffness, muscle tenderness and myalgias.  Skin:  Positive for hair loss. Negative for color  change, rash and sensitivity to sunlight.  Allergic/Immunologic: Negative for susceptible to infections.  Neurological:  Negative for dizziness and headaches.  Hematological:  Negative for swollen glands.  Psychiatric/Behavioral:  Positive for sleep disturbance. Negative for depressed mood. The patient is not nervous/anxious.     PMFS History:  Patient Active Problem List   Diagnosis Date Noted   Parkinson's disease (HCC) 02/22/2021   Chondromalacia of both patellae 10/04/2016   History of tremor/ Parkinsons dx.  10/04/2016   History of TIA (transient ischemic attack) 10/04/2016   History of hyperlipidemia 10/04/2016   Tremor 03/24/2016   Rheumatoid arthritis involving multiple sites with positive rheumatoid factor (HCC) 03/24/2016   Primary osteoarthritis of both hands 03/24/2016   Primary osteoarthritis of both knees 03/24/2016   Primary osteoarthritis of both feet 03/24/2016   High risk medication use 03/24/2016    Past Medical History:  Diagnosis Date   HTN (hypertension)    Parkinson's disease (HCC)    rheumatoid arthritis    skin cancer     Family History  Problem Relation Age of Onset   Stroke Mother    Heart attack Father    Rheum arthritis Sister    Leukemia Sister    Lung disease Brother    Dementia Brother    Diabetes Maternal Grandmother    Diabetes Paternal Grandmother    Past Surgical History:  Procedure Laterality Date   ABDOMINAL HYSTERECTOMY     bladder tack     EYE SURGERY Left 11/09/2021  FOOT SURGERY     SKIN CANCER EXCISION  11/2015   SQUAMOUS CELL CARCINOMA EXCISION  03/2018   right arm   Social History   Social History Narrative   Right handed    Lives with son    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  Constitutional:      Appearance: She is well-developed.  HENT:     Head: Normocephalic and atraumatic.  Eyes:     Conjunctiva/sclera: Conjunctivae normal.  Cardiovascular:     Rate and Rhythm: Normal rate and regular rhythm.     Heart sounds: Normal heart sounds.  Pulmonary:     Effort: Pulmonary effort is normal.     Breath sounds: Normal breath sounds.  Abdominal:     General: Bowel sounds are normal.     Palpations: Abdomen is soft.  Musculoskeletal:     Cervical back: Normal range of motion.  Lymphadenopathy:     Cervical: No cervical adenopathy.  Skin:    General: Skin is warm and dry.     Capillary Refill: Capillary refill takes less than 2 seconds.  Neurological:     Mental Status: She is alert and oriented to person, place, and time.  Psychiatric:        Behavior: Behavior normal.      Musculoskeletal Exam: Patient remains seated during examination today.  C-spine has good range of motion with no discomfort.  Posture thoracic opposes noted.  No midline spinal tenderness.  No SI joint tenderness.  Shoulder joints, elbow joints, wrist joints MCPs, PIPs, DIPs have good range of motion with no synovitis.  PIP and DIP thickening noted.  No active inflammation noted.  Complete fist formation bilaterally.  Knee joints have good range of motion with no warmth or effusion.  Ankle joints have good range of motion with no tenderness or joint swelling.  Bunion formation noted on the left foot.  CDAI Exam: CDAI Score: -- Patient Global: --; Provider Global: -- Swollen: --; Tender: -- Joint Exam 07/31/2023   No joint exam has been documented for this visit   There is currently no information documented on the homunculus. Go to the Rheumatology activity and complete the homunculus joint exam.  Investigation: No additional findings.  Imaging: No results  found.  Recent Labs: Lab Results  Component Value Date   WBC 8.2 03/06/2023   HGB 13.9 03/06/2023   PLT 281 03/06/2023   NA 136 03/06/2023   K 4.2 03/06/2023   CL 99 03/06/2023   CO2 30 03/06/2023   GLUCOSE 79 03/06/2023   BUN 19 03/06/2023   CREATININE 0.69 03/06/2023   BILITOT 0.6 03/06/2023   ALKPHOS 94 11/15/2021   AST 17 03/06/2023   ALT 9 03/06/2023   PROT 6.0 (L) 03/06/2023   ALBUMIN 4.2 11/15/2021   CALCIUM 9.5 03/06/2023   GFRAA 90 05/21/2020   QFTBGOLDPLUS NEGATIVE 01/23/2018    Speciality Comments: PLQ Eye Exam: 05/15/2023 WNL @ [facility] [place] in 1 year  Immunosupressant Lab Work:  TB gold-negative 09/04/13  Hepatitis panel- negative except Hep A IgM antibody  09/04/13  Procedures:  No procedures performed Allergies: Hydrocodone-acetaminophen, Meloxicam, Prednisone, and Gabapentin   Assessment / Plan:     Visit Diagnoses: Rheumatoid arthritis involving multiple sites with positive rheumatoid factor (HCC): She has no synovitis on examination today.  She has not had any signs or symptoms of a rheumatoid arthritis flare.  She denies any morning stiffness, nocturnal pain, or difficulty with ADLs.  She has clinically been doing well taking Plaquenil 200 mg 1 tablet by mouth twice daily Monday through Friday.  She is tolerating Plaquenil without any side effects and has not had any recent gaps in therapy.  No medication changes will be made at this time.  She was advised to notify us  if she develops signs or symptoms of a flare.  She will follow-up in the office in 5 months or sooner if needed.  High risk medication use - Plaquenil 200 mg 1 tablet by mouth twice daily Monday through Friday only.  PLQ Eye Exam: 05/15/2023 WNL @ [facility] [place] in 1 year  CBC and CMP updated on 03/06/23. Orders for CBC and CMP released today.  - Plan: CBC with Differential/Platelet, Comprehensive metabolic panel with GFR  Primary osteoarthritis of both hands: She has PIP  and DIP thickening consistent with osteoarthritis of both hands.  No active inflammation noted on examination today.  Primary osteoarthritis of both knees: She has good range of motion of both knee joints on examination today.  No warmth or effusion noted.  Chondromalacia of both patellae: No inflammation noted. Using a rollator walker or cane to assist with ambulation.   Primary osteoarthritis of both feet: She is not experiencing any increased discomfort in her feet at this time.  She has good range of motion of both ankle joints with no tenderness or joint swelling.  Bunion formation noted on the left foot.  She is wearing proper fitting shoes.  Other medical conditions are listed as follows:  History of tremor/ Parkinsons dx.   History of TIA (transient ischemic attack)  History of hyperlipidemia  Orders: Orders Placed This Encounter  Procedures   CBC with Differential/Platelet   Comprehensive metabolic panel with GFR   No orders of the defined types were placed in this encounter.    Follow-Up Instructions: Return in about 5 months (around 12/31/2023) for Rheumatoid arthritis.   Romayne Clubs, PA-C  Note - This record has been created using Dragon software.  Chart creation errors have been sought, but may not always  have been located. Such creation errors do not reflect on  the standard of medical care.

## 2023-07-31 ENCOUNTER — Ambulatory Visit: Payer: Medicare HMO | Attending: Physician Assistant | Admitting: Physician Assistant

## 2023-07-31 ENCOUNTER — Encounter: Payer: Self-pay | Admitting: Physician Assistant

## 2023-07-31 VITALS — BP 120/79 | HR 69 | Resp 13 | Ht 61.0 in | Wt 126.6 lb

## 2023-07-31 DIAGNOSIS — Z79899 Other long term (current) drug therapy: Secondary | ICD-10-CM | POA: Diagnosis not present

## 2023-07-31 DIAGNOSIS — M2242 Chondromalacia patellae, left knee: Secondary | ICD-10-CM

## 2023-07-31 DIAGNOSIS — M19041 Primary osteoarthritis, right hand: Secondary | ICD-10-CM

## 2023-07-31 DIAGNOSIS — M0579 Rheumatoid arthritis with rheumatoid factor of multiple sites without organ or systems involvement: Secondary | ICD-10-CM | POA: Diagnosis not present

## 2023-07-31 DIAGNOSIS — Z8639 Personal history of other endocrine, nutritional and metabolic disease: Secondary | ICD-10-CM

## 2023-07-31 DIAGNOSIS — M19042 Primary osteoarthritis, left hand: Secondary | ICD-10-CM

## 2023-07-31 DIAGNOSIS — M170 Bilateral primary osteoarthritis of knee: Secondary | ICD-10-CM

## 2023-07-31 DIAGNOSIS — Z8669 Personal history of other diseases of the nervous system and sense organs: Secondary | ICD-10-CM

## 2023-07-31 DIAGNOSIS — M2241 Chondromalacia patellae, right knee: Secondary | ICD-10-CM

## 2023-07-31 DIAGNOSIS — M19072 Primary osteoarthritis, left ankle and foot: Secondary | ICD-10-CM

## 2023-07-31 DIAGNOSIS — M19071 Primary osteoarthritis, right ankle and foot: Secondary | ICD-10-CM

## 2023-07-31 DIAGNOSIS — Z8673 Personal history of transient ischemic attack (TIA), and cerebral infarction without residual deficits: Secondary | ICD-10-CM

## 2023-07-31 LAB — CBC WITH DIFFERENTIAL/PLATELET
Absolute Lymphocytes: 2117 {cells}/uL (ref 850–3900)
Absolute Monocytes: 647 {cells}/uL (ref 200–950)
Basophils Absolute: 108 {cells}/uL (ref 0–200)
Basophils Relative: 1.3 %
Eosinophils Absolute: 490 {cells}/uL (ref 15–500)
Eosinophils Relative: 5.9 %
HCT: 41.4 % (ref 35.0–45.0)
Hemoglobin: 13.5 g/dL (ref 11.7–15.5)
MCH: 28.5 pg (ref 27.0–33.0)
MCHC: 32.6 g/dL (ref 32.0–36.0)
MCV: 87.3 fL (ref 80.0–100.0)
MPV: 9.9 fL (ref 7.5–12.5)
Monocytes Relative: 7.8 %
Neutro Abs: 4939 {cells}/uL (ref 1500–7800)
Neutrophils Relative %: 59.5 %
Platelets: 233 10*3/uL (ref 140–400)
RBC: 4.74 10*6/uL (ref 3.80–5.10)
RDW: 13.4 % (ref 11.0–15.0)
Total Lymphocyte: 25.5 %
WBC: 8.3 10*3/uL (ref 3.8–10.8)

## 2023-07-31 LAB — COMPREHENSIVE METABOLIC PANEL WITH GFR
AG Ratio: 2.2 (calc) (ref 1.0–2.5)
ALT: 8 U/L (ref 6–29)
AST: 18 U/L (ref 10–35)
Albumin: 3.9 g/dL (ref 3.6–5.1)
Alkaline phosphatase (APISO): 72 U/L (ref 37–153)
BUN: 14 mg/dL (ref 7–25)
CO2: 32 mmol/L (ref 20–32)
Calcium: 9.1 mg/dL (ref 8.6–10.4)
Chloride: 96 mmol/L — ABNORMAL LOW (ref 98–110)
Creat: 0.67 mg/dL (ref 0.60–1.00)
Globulin: 1.8 g/dL — ABNORMAL LOW (ref 1.9–3.7)
Glucose, Bld: 84 mg/dL (ref 65–99)
Potassium: 4 mmol/L (ref 3.5–5.3)
Sodium: 133 mmol/L — ABNORMAL LOW (ref 135–146)
Total Bilirubin: 0.5 mg/dL (ref 0.2–1.2)
Total Protein: 5.7 g/dL — ABNORMAL LOW (ref 6.1–8.1)
eGFR: 90 mL/min/{1.73_m2} (ref >=60)

## 2023-08-01 NOTE — Progress Notes (Signed)
 CBC WNL Sodium and chloride are low. Total protein and globulin are low.  Please forward results to PCP as requested.

## 2023-08-04 ENCOUNTER — Other Ambulatory Visit: Payer: Self-pay | Admitting: Physician Assistant

## 2023-08-04 DIAGNOSIS — M0579 Rheumatoid arthritis with rheumatoid factor of multiple sites without organ or systems involvement: Secondary | ICD-10-CM

## 2023-08-04 NOTE — Telephone Encounter (Signed)
 Last Fill: 05/17/2023  Eye exam: 05/15/2023   Labs: 07/31/2023 CBC WNL Sodium and chloride are low. Total protein and globulin are low.   Next Visit: 01/04/2024  Last Visit: 07/31/2023  YN:WGNFAOZHYQ arthritis involving multiple sites with positive rheumatoid factor   Current Dose per office note on 07/31/2023:  Plaquenil  200 mg 1 tablet by mouth twice daily Monday through Friday only.   Okay to refill Plaquenil ?

## 2023-08-11 ENCOUNTER — Other Ambulatory Visit: Payer: Self-pay | Admitting: Neurology

## 2023-08-11 DIAGNOSIS — G20A1 Parkinson's disease without dyskinesia, without mention of fluctuations: Secondary | ICD-10-CM

## 2023-08-11 DIAGNOSIS — G20B2 Parkinson's disease with dyskinesia, with fluctuations: Secondary | ICD-10-CM

## 2023-10-13 NOTE — Telephone Encounter (Signed)
 Patient notified

## 2023-10-13 NOTE — Telephone Encounter (Signed)
 Pt called in asking when she could restart her Asprin. Patient was told to hold it due to her surgery yesterday. Pt is requesting a callback

## 2023-10-24 ENCOUNTER — Other Ambulatory Visit: Payer: Self-pay | Admitting: Physician Assistant

## 2023-10-24 DIAGNOSIS — M0579 Rheumatoid arthritis with rheumatoid factor of multiple sites without organ or systems involvement: Secondary | ICD-10-CM

## 2023-10-24 NOTE — Progress Notes (Unsigned)
 Assessment/Plan:   1.  Parkinsons Disease with Parkinsons Disease dyskinesia  -Increase carbidopa /levodopa  25/100, 1.5 tablet at 5am and 9 AM/ and then 1 tablet at 1 PM/5 PM.   -Continue carbidopa /levodopa  50/200 CR at bedtime  -Continue amantadine  100 mg, 1 at 9 AM and 1 PM  -Follows with dermatology.  -fell when rollator got out in front of her too fast and discussed that regular walker may be better.    2.  Insomnia  -Likely related to nocturia.  May need to see urology or urogynecology in the future.  3.  REM behavior disorder  -This is commonly associated with PD and the patient is experiencing this.  We discussed that this can be very serious and even harmful.  We talked about medications as well as physical barriers to put in the bed (particularly soft bed rails, pillow barriers).  We talked about moving the night stand so that it is not so close to the side of the bed.   She   4.  Chronic pain  -on oxycodone    Subjective:   Dawn Harrell was seen today in follow up for Parkinsons disease.  My previous records were reviewed prior to todays visit as well as outside records available to me. Pt with sister who supplements hx.   She has had some falls - the rollator got out in front of her and she fell.  patient remains on levodopa  and amantadine .  She has had no hallucinations.  No lightheadedness or near syncope. Notes some curling of the toes between dosages, about 15 min before the next is due.   Patient did have recent surgery on June 26 (bilateral salpingo-oophorectomy).  This was done for ovarian cyst.  The pathology was benign.  She reports she never had one pain.  Current prescribed movement disorder medications:  Carbidopa /levodopa  25/100, 1 tablet at 5am/9 AM/1 PM/5 PM Carbidopa /levodopa  50/200 CR at bedtime  Amantadine , 1 tablet at 9 AM and 1 PM   ALLERGIES:   Allergies  Allergen Reactions   Hydrocodone-Acetaminophen Shortness Of Breath   Meloxicam Nausea  And Vomiting and Nausea Only   Prednisone Other (See Comments)   Gabapentin     Swelling     CURRENT MEDICATIONS:  Current Meds  Medication Sig   acetaminophen (TYLENOL) 500 MG tablet Take 500 mg by mouth as needed.   alendronate (FOSAMAX) 70 MG tablet Take 70 mg by mouth once a week.   amantadine  (SYMMETREL ) 100 MG capsule Take 1 capsule (100 mg total) by mouth 2 (two) times daily.   amLODipine (NORVASC) 2.5 MG tablet Take by mouth.   ASPIRIN 81 PO Take by mouth daily.   CALCIUM PO Take 1,200 mg by mouth daily. +vitamin D   carbidopa -levodopa  (SINEMET  CR) 50-200 MG tablet Take 1 tablet by mouth at bedtime.   carbidopa -levodopa  (SINEMET  IR) 25-100 MG tablet Take 1 tablet by mouth 4 times daily at 5am, 9am, 1pm and 5pm   carvedilol (COREG) 6.25 MG tablet Take 6.25 mg by mouth 2 (two) times daily.   Cholecalciferol (D-3-5) 125 MCG (5000 UT) capsule Take by mouth.   Coenzyme Q10 (CO Q 10 PO) Take 200 mg by mouth daily.   folic acid  (FOLVITE ) 400 MCG tablet Take by mouth.   hydroxychloroquine  (PLAQUENIL ) 200 MG tablet TAKE 1 TABLET TWICE DAILY MONDAY THROUGH FRIDAY   methocarbamol (ROBAXIN) 500 MG tablet Take 500 mg by mouth daily.   Multiple Vitamins-Minerals (MULTIVITAMIN WITH MINERALS) tablet every morning before  breakfast.   oxyCODONE (OXY IR/ROXICODONE) 5 MG immediate release tablet Take 5 mg by mouth as needed.   rosuvastatin (CRESTOR) 5 MG tablet Take 5 mg by mouth daily.   vitamin B-12 (CYANOCOBALAMIN ) 100 MCG tablet      Objective:   PHYSICAL EXAMINATION:    VITALS:   Vitals:   10/26/23 0849  BP: 122/72  Pulse: 72  SpO2: 99%  Weight: 124 lb 9.6 oz (56.5 kg)    GEN:  The patient appears stated age and is in NAD. HEENT:  Normocephalic, atraumatic.  The mucous membranes are moist. The superficial temporal arteries are without ropiness or tenderness. CV:  RRR Lungs:  CTAB Neck/HEME:  There are no carotid bruits bilaterally.  Neurological examination:  Orientation:  The patient is alert and oriented x3. Cranial nerves: There is good facial symmetry with mild facial hypomimia. The speech is fluent and clear. Soft palate rises symmetrically and there is no tongue deviation. Hearing is intact to conversational tone. Sensation: Sensation is intact to light touch throughout Motor: Strength is at least antigravity x4.  Movement examination: Tone: There is nl tone in the UE bilaterally Abnormal movements: There is RUE rest tremor, intermittent and jerky in quality Coordination:  There is no significant decremation today. Gait and Station: The patient pushes off to arise.  Ambulates well with her walker  I have reviewed and interpreted the following labs independently    Chemistry      Component Value Date/Time   NA 133 (L) 07/31/2023 0917   NA 134 11/15/2021 1001   K 4.0 07/31/2023 0917   CL 96 (L) 07/31/2023 0917   CO2 32 07/31/2023 0917   BUN 14 07/31/2023 0917   BUN 8 11/15/2021 1001   CREATININE 0.67 07/31/2023 0917      Component Value Date/Time   CALCIUM 9.1 07/31/2023 0917   ALKPHOS 94 11/15/2021 1001   AST 18 07/31/2023 0917   ALT 8 07/31/2023 0917   BILITOT 0.5 07/31/2023 0917   BILITOT 0.4 11/15/2021 1001       Lab Results  Component Value Date   WBC 8.3 07/31/2023   HGB 13.5 07/31/2023   HCT 41.4 07/31/2023   MCV 87.3 07/31/2023   PLT 233 07/31/2023    No results found for: TSH  Total time spent on today's visit was 30 minutes, including both face-to-face time and nonface-to-face time.  Time included that spent on review of records (prior notes available to me/labs/imaging if pertinent), discussing treatment and goals, answering patient's questions and coordinating care.   Cc:  Erick Greig LABOR, NP

## 2023-10-24 NOTE — Telephone Encounter (Signed)
 Last Fill: 08/04/2023  Eye exam: 05/15/2023 WNL    Labs: 10/04/2023 Sodium 135, Anion Gap 4, Total Protein 5.8, ALT 5  Next Visit: 01/04/2024  Last Visit: 07/31/2023  DX: Rheumatoid arthritis involving multiple sites with positive rheumatoid factor   Current Dose per office note 07/31/2023: Plaquenil  200 mg 1 tablet by mouth twice daily Monday through Friday only.   Okay to refill Plaquenil ?

## 2023-10-26 ENCOUNTER — Encounter: Payer: Self-pay | Admitting: Neurology

## 2023-10-26 ENCOUNTER — Ambulatory Visit: Admitting: Neurology

## 2023-10-26 DIAGNOSIS — G20B2 Parkinson's disease with dyskinesia, with fluctuations: Secondary | ICD-10-CM

## 2023-10-26 MED ORDER — CARBIDOPA-LEVODOPA 25-100 MG PO TABS: ORAL_TABLET | ORAL | 1 refills | Status: DC

## 2023-10-26 NOTE — Patient Instructions (Addendum)
 increase carbidopa /levodopa  25/100, 1.5 tablet at 5am and 9 AM  and then 1 tablet at 1 PM/5 PM.  2.  Continue carbidopa /levodopa  50/200 CR at bedtime 3.  Continue amantadine  100 mg, 1 at 9 AM and 1 PM  SAVE THE DATE!  We are planning a Parkinsons Disease educational symposium at [facility], [address] [place], [place], [place] [address] on September 19.  We will have a movement disorder physician expert from [employer] coming to speak and a caregiver speaker.  We will have a panel of experts that will show you who you may need on your team of people on your journey with Parkinsons.  I hope to see you there!  Use this QR code to register by scanning it with the camera app on your phone:      Need more help with registration?  Contact [email or web address]

## 2023-12-21 NOTE — Progress Notes (Unsigned)
 Office Visit Note  Patient: Dawn Harrell             Date of Birth: 05-04-1945           MRN: [id number]             PCP: Erick Greig LABOR, NP Referring: Erick Greig LABOR, NP Visit Date: 01/04/2024 Occupation: @GUAROCC @  Subjective:  Medication monitoring  History of Present Illness: Dawn Harrell is a 78 y.o. female with history of seropositive rheumatoid arthritis.  Patient remains on  Plaquenil  200 mg 1 tablet by mouth twice daily Monday through Friday only.  She is tolerating it without any side effects and has not had any gaps in therapy.  Patient reports for the past couple week she has been experiencing increased muscle tension in the right trapezius muscle.  She denies any change in activity or overuse activities.  Patient rates the pain a 3 out of 10.  She denies any nocturnal pain.  She denies any other joint pain or joint swelling at this time.  She denies any recent or recurrent infections.  Activities of Daily Living:  Patient reports morning stiffness for 0 minute.   Patient Denies nocturnal pain.  Difficulty dressing/grooming: Denies Difficulty climbing stairs: Reports Difficulty getting out of chair: Reports Difficulty using hands for taps, buttons, cutlery, and/or writing: Reports  Review of Systems  Constitutional:  Negative for fatigue.  HENT:  Negative for mouth sores and mouth dryness.   Eyes:  Negative for dryness.  Respiratory:  Negative for shortness of breath.   Cardiovascular:  Negative for chest pain and palpitations.  Gastrointestinal:  Negative for blood in stool, constipation and diarrhea.  Endocrine: Negative for increased urination.  Genitourinary:  Negative for involuntary urination.  Musculoskeletal:  Positive for joint pain, gait problem and joint pain. Negative for joint swelling, myalgias, muscle weakness, morning stiffness, muscle tenderness and myalgias.  Skin:  Positive for rash and hair loss. Negative for color change and sensitivity to sunlight.   Allergic/Immunologic: Negative for susceptible to infections.  Neurological:  Negative for dizziness and headaches.  Hematological:  Negative for swollen glands.  Psychiatric/Behavioral:  Positive for sleep disturbance. Negative for depressed mood. The patient is not nervous/anxious.     PMFS History:  Patient Active Problem List   Diagnosis Date Noted   Parkinson's disease (HCC) 02/22/2021   Chondromalacia of both patellae 10/04/2016   History of tremor/ Parkinsons dx.  10/04/2016   History of TIA (transient ischemic attack) 10/04/2016   History of hyperlipidemia 10/04/2016   Tremor 03/24/2016   Rheumatoid arthritis involving multiple sites with positive rheumatoid factor (HCC) 03/24/2016   Primary osteoarthritis of both hands 03/24/2016   Primary osteoarthritis of both knees 03/24/2016   Primary osteoarthritis of both feet 03/24/2016   High risk medication use 03/24/2016    Past Medical History:  Diagnosis Date   HTN (hypertension)    Parkinson's disease (HCC)    rheumatoid arthritis    skin cancer     Family History  Problem Relation Age of Onset   Stroke Mother    Heart attack Father    Rheum arthritis Sister    Leukemia Sister    Lung disease Brother    Dementia Brother    Diabetes Maternal Grandmother    Diabetes Paternal Grandmother    Past Surgical History:  Procedure Laterality Date   ABDOMINAL HYSTERECTOMY     bladder tack     EYE SURGERY Left 11/09/2021   FOOT SURGERY  OVARIAN CYST REMOVAL     SKIN CANCER EXCISION  11/2015   SQUAMOUS CELL CARCINOMA EXCISION  03/2018   right arm   Social History   Social History Narrative   Right handed    Lives with son    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  Constitutional:      Appearance: She is well-developed.  HENT:     Head: Normocephalic and atraumatic.  Eyes:     Conjunctiva/sclera: Conjunctivae normal.  Cardiovascular:     Rate and Rhythm: Normal rate and regular rhythm.     Heart sounds: Normal heart sounds.  Pulmonary:     Effort: Pulmonary effort is normal.     Breath sounds: Normal breath sounds.  Abdominal:     General: Bowel sounds are normal.     Palpations: Abdomen is soft.  Musculoskeletal:     Cervical back: Normal range of motion.  Lymphadenopathy:     Cervical: No cervical adenopathy.  Skin:    General: Skin is warm and dry.     Capillary Refill: Capillary refill takes less than 2 seconds.  Neurological:     Mental Status: She is alert and oriented to person, place, and time.  Psychiatric:        Behavior: Behavior normal.      Musculoskeletal Exam: C-spine has good range of motion.  Trapezius muscle tension and tenderness noted bilaterally, right greater than left.  Thoracic kyphosis noted.  No midline spinal tenderness.  No SI joint tenderness.  Shoulder joints, elbow joints, wrist joints, MCPs, PIPs, DIPs have good range of motion with no synovitis.  PIP and DIP thickening consistent with osteoarthritis of both hands.  Complete fist formation bilaterally.  Hip joints have good range of motion with no groin pain.  Knee joints have good range of motion no warmth or effusion.  Ankle joints have good range of motion with no tenderness or joint swelling.  CDAI Exam: CDAI Score: -- Patient Global: --; Provider Global: -- Swollen: --; Tender: -- Joint Exam 01/04/2024   No joint exam has been documented for this visit   There is currently no information documented on the homunculus. Go to the Rheumatology activity and complete the homunculus joint exam.  Investigation: No additional findings.  Imaging: No results found.  Recent Labs: Lab  Results  Component Value Date   WBC 8.3 07/31/2023   HGB 13.5 07/31/2023   PLT 233 07/31/2023   NA 133 (L) 07/31/2023   K 4.0 07/31/2023   CL 96 (L) 07/31/2023   CO2 32 07/31/2023   GLUCOSE 84 07/31/2023   BUN 14 07/31/2023   CREATININE 0.67 07/31/2023   BILITOT 0.5 07/31/2023   ALKPHOS 94 11/15/2021   AST 18 07/31/2023   ALT 8 07/31/2023   PROT 5.7 (L) 07/31/2023   ALBUMIN 4.2 11/15/2021   CALCIUM 9.1 07/31/2023   GFRAA 90 05/21/2020   QFTBGOLDPLUS NEGATIVE 01/23/2018    Speciality Comments: PLQ Eye Exam: 05/15/2023 WNL @ [facility] [place] in 1 year  Immunosupressant Lab Work:  TB gold-negative 09/04/13  Hepatitis panel- negative  except Hep A IgM antibody 09/04/13  Procedures:  No procedures performed Allergies: Hydrocodone-acetaminophen, Meloxicam, Prednisone, and Gabapentin    Assessment / Plan:     Visit Diagnoses: Rheumatoid arthritis involving multiple sites with positive rheumatoid factor (HCC) - She has no synovitis on examination today.  She has not had any signs or symptoms of a rheumatoid arthritis flare.  She has clinically been doing well taking Plaquenil  200 mg 1 tablet by mouth twice daily Monday through Friday.  She is tolerating Plaquenil  without any side effects and has not had any gaps in therapy.  She has not been experiencing any morning stiffness or nocturnal pain.  No medication changes will be made at this time.  A refill of Plaquenil  sent to the pharmacy today.  She will follow-up in the office in 5 months or sooner if needed. Plan: Lipid panel, hydroxychloroquine  (PLAQUENIL ) 200 MG tablet  High risk medication use - Plaquenil  200 mg 1 tablet by mouth twice daily Monday through Friday only. PLQ Eye Exam: 05/15/2023 WNL @ [facility] up in 1 year  PLQ Eye Exam: 05/15/2023 WNL @ [facility]  Future order for lipid panel placed today.  According to the patient she will be having updated lab work with her PCP next month and will have  results ported to our office to review. CBC updated on 10/12/23.  CMP updated on 10/04/23.  Her next lab work will be due in November.  According to the patient she will be following up with her PCP next month and will likely have updated lab work and will have results forwarded to our office to review. - Plan: Lipid panel  Primary osteoarthritis of both hands: PIP and DIP thickening consistent with osteoarthritis of both hands.  No synovitis noted.  Complete fist formation bilaterally.  Primary osteoarthritis of both knees: She has good range of motion of both knee joints on examination today.  No warmth or effusion noted.  Using a cane to assist with ambulation.  Chondromalacia of both patellae: Good range of motion of both knee joints.  Primary osteoarthritis of both feet: She has good range of motion of both ankle joints with no tenderness or joint swelling.  She is wearing proper fitting shoes.  She is not experiencing any increased discomfort in her feet at this time.  Other medical conditions are listed as follows:  History of tremor/ Parkinsons dx.   History of TIA (transient ischemic attack)  History of hyperlipidemia -Future order for lipid panel placed today.  Patient will be having lab work with her PCP next month and will have results forwarded to our office to review. plan: Lipid panel  Orders: Orders Placed This Encounter  Procedures   Lipid panel   Meds ordered this encounter  Medications   hydroxychloroquine  (PLAQUENIL ) 200 MG tablet    Sig: TAKE 1 TABLET TWICE DAILY MONDAY THROUGH FRIDAY    Dispense:  120 tablet    Refill:  0    Follow-Up Instructions: Return in about 5 months (around 06/05/2024) for Rheumatoid arthritis.   Waddell CHRISTELLA Craze, PA-C  Note - This record has been created using Dragon software.  Chart creation errors have been sought, but may not always  have been located. Such creation errors do not reflect on  the standard of medical care.

## 2024-01-04 ENCOUNTER — Ambulatory Visit: Attending: Physician Assistant | Admitting: Physician Assistant

## 2024-01-04 ENCOUNTER — Encounter: Payer: Self-pay | Admitting: Physician Assistant

## 2024-01-04 VITALS — BP 128/84 | HR 63 | Temp 97.2°F | Resp 12 | Ht 61.0 in | Wt 123.6 lb

## 2024-01-04 DIAGNOSIS — M19072 Primary osteoarthritis, left ankle and foot: Secondary | ICD-10-CM

## 2024-01-04 DIAGNOSIS — Z79899 Other long term (current) drug therapy: Secondary | ICD-10-CM | POA: Diagnosis present

## 2024-01-04 DIAGNOSIS — M170 Bilateral primary osteoarthritis of knee: Secondary | ICD-10-CM | POA: Diagnosis present

## 2024-01-04 DIAGNOSIS — Z8669 Personal history of other diseases of the nervous system and sense organs: Secondary | ICD-10-CM

## 2024-01-04 DIAGNOSIS — M2242 Chondromalacia patellae, left knee: Secondary | ICD-10-CM | POA: Diagnosis present

## 2024-01-04 DIAGNOSIS — M19071 Primary osteoarthritis, right ankle and foot: Secondary | ICD-10-CM | POA: Diagnosis present

## 2024-01-04 DIAGNOSIS — Z8673 Personal history of transient ischemic attack (TIA), and cerebral infarction without residual deficits: Secondary | ICD-10-CM | POA: Diagnosis present

## 2024-01-04 DIAGNOSIS — Z8639 Personal history of other endocrine, nutritional and metabolic disease: Secondary | ICD-10-CM

## 2024-01-04 DIAGNOSIS — M2241 Chondromalacia patellae, right knee: Secondary | ICD-10-CM

## 2024-01-04 DIAGNOSIS — M0579 Rheumatoid arthritis with rheumatoid factor of multiple sites without organ or systems involvement: Secondary | ICD-10-CM | POA: Diagnosis present

## 2024-01-04 DIAGNOSIS — M19041 Primary osteoarthritis, right hand: Secondary | ICD-10-CM

## 2024-01-04 DIAGNOSIS — M19042 Primary osteoarthritis, left hand: Secondary | ICD-10-CM | POA: Diagnosis present

## 2024-01-04 MED ORDER — HYDROXYCHLOROQUINE SULFATE 200 MG PO TABS: ORAL_TABLET | ORAL | 0 refills | Status: DC

## 2024-01-04 NOTE — Patient Instructions (Signed)
 Standing Labs We placed an order today for your standing lab work.   Please have your standing labs drawn in November and every 5 months   Please have your labs drawn 2 weeks prior to your appointment so that the provider can discuss your lab results at your appointment, if possible.  Please note that you may see your imaging and lab results in MyChart before we have reviewed them. We will contact you once all results are reviewed. Please allow our office up to 72 hours to thoroughly review all of the results before contacting the office for clarification of your results.  WALK-IN LAB HOURS  Monday through Thursday from 8:00 am -12:30 pm and 1:00 pm-4:30 pm and Friday from 8:00 am-12:00 pm.  Patients with office visits requiring labs will be seen before walk-in labs.  You may encounter longer than normal wait times. Please allow additional time. Wait times may be shorter on  Monday and Thursday afternoons.  We do not book appointments for walk-in labs. We appreciate your patience and understanding with our staff.   Labs are drawn by [facility]. Please bring your co-pay at the time of your lab draw.  You may receive a bill from [facility] for your lab work.  Please note if you are on Hydroxychloroquine  and and an order has been placed for a Hydroxychloroquine  level,  you will need to have it drawn 4 hours or more after your last dose.  If you wish to have your labs drawn at another location, please call the office 24 hours in advance so we can fax the orders.  The office is located at [address], [address], [place], [place] [address]   If you have any questions regarding directions or hours of operation,  please call [phone].   As a reminder, please drink plenty of water prior to coming for your lab work. Thanks!

## 2024-01-05 ENCOUNTER — Ambulatory Visit: Admitting: Physician Assistant

## 2024-02-10 ENCOUNTER — Other Ambulatory Visit: Payer: Self-pay | Admitting: Neurology

## 2024-02-10 DIAGNOSIS — G20B2 Parkinson's disease with dyskinesia, with fluctuations: Secondary | ICD-10-CM

## 2024-02-18 ENCOUNTER — Other Ambulatory Visit: Payer: Self-pay | Admitting: Neurology

## 2024-02-18 DIAGNOSIS — G20A1 Parkinson's disease without dyskinesia, without mention of fluctuations: Secondary | ICD-10-CM

## 2024-03-01 ENCOUNTER — Other Ambulatory Visit: Payer: Self-pay

## 2024-03-01 DIAGNOSIS — Z79899 Other long term (current) drug therapy: Secondary | ICD-10-CM

## 2024-03-01 NOTE — Progress Notes (Signed)
 Patient called to have lab orders done at [facility] in [place] on 03/05/2024. Orders placed.

## 2024-03-05 ENCOUNTER — Other Ambulatory Visit: Payer: Self-pay | Admitting: *Deleted

## 2024-03-05 DIAGNOSIS — Z79899 Other long term (current) drug therapy: Secondary | ICD-10-CM

## 2024-03-05 DIAGNOSIS — Z8639 Personal history of other endocrine, nutritional and metabolic disease: Secondary | ICD-10-CM

## 2024-03-05 DIAGNOSIS — M0579 Rheumatoid arthritis with rheumatoid factor of multiple sites without organ or systems involvement: Secondary | ICD-10-CM

## 2024-03-05 NOTE — Addendum Note (Signed)
 Addended by: CENA ALFONSO CROME on: 03/05/2024 10:47 AM   Modules accepted: Orders

## 2024-03-06 ENCOUNTER — Ambulatory Visit: Payer: Self-pay | Admitting: Physician Assistant

## 2024-03-06 LAB — LIPID PANEL
Chol/HDL Ratio: 1.9 ratio (ref 0.0–4.4)
Cholesterol, Total: 146 mg/dL (ref 100–199)
HDL: 75 mg/dL (ref >39)
LDL Chol Calc (NIH): 58 mg/dL (ref 0–99)
Triglycerides: 64 mg/dL (ref 0–149)
VLDL Cholesterol Cal: 13 mg/dL (ref 5–40)

## 2024-03-06 LAB — CBC WITH DIFFERENTIAL/PLATELET
Basophils Absolute: 0.1 x10E3/uL (ref 0.0–0.2)
Basos: 1 %
EOS (ABSOLUTE): 0.1 x10E3/uL (ref 0.0–0.4)
Eos: 2 %
Hematocrit: 42.6 % (ref 34.0–46.6)
Hemoglobin: 13.8 g/dL (ref 11.1–15.9)
Immature Grans (Abs): 0 x10E3/uL (ref 0.0–0.1)
Immature Granulocytes: 0 %
Lymphocytes Absolute: 2 x10E3/uL (ref 0.7–3.1)
Lymphs: 34 %
MCH: 28.8 pg (ref 26.6–33.0)
MCHC: 32.4 g/dL (ref 31.5–35.7)
MCV: 89 fL (ref 79–97)
Monocytes Absolute: 0.5 x10E3/uL (ref 0.1–0.9)
Monocytes: 9 %
Neutrophils Absolute: 3.2 x10E3/uL (ref 1.4–7.0)
Neutrophils: 54 %
Platelets: 255 x10E3/uL (ref 150–450)
RBC: 4.8 x10E6/uL (ref 3.77–5.28)
RDW: 12.5 % (ref 11.7–15.4)
WBC: 5.9 x10E3/uL (ref 3.4–10.8)

## 2024-03-06 LAB — CMP14+EGFR
ALT: 8 IU/L (ref 0–32)
AST: 26 IU/L (ref 0–40)
Albumin: 4 g/dL (ref 3.8–4.8)
Alkaline Phosphatase: 86 IU/L (ref 49–135)
BUN/Creatinine Ratio: 20 (ref 12–28)
BUN: 17 mg/dL (ref 8–27)
Bilirubin Total: 0.4 mg/dL (ref 0.0–1.2)
CO2: 27 mmol/L (ref 20–29)
Calcium: 9.9 mg/dL (ref 8.7–10.3)
Chloride: 98 mmol/L (ref 96–106)
Creatinine, Ser: 0.83 mg/dL (ref 0.57–1.00)
Globulin, Total: 1.7 g/dL (ref 1.5–4.5)
Glucose: 85 mg/dL (ref 70–99)
Potassium: 4.3 mmol/L (ref 3.5–5.2)
Sodium: 136 mmol/L (ref 134–144)
Total Protein: 5.7 g/dL — ABNORMAL LOW (ref 6.0–8.5)
eGFR: 73 mL/min/1.73 (ref >59)

## 2024-03-06 NOTE — Progress Notes (Signed)
 Total protein is low but stable. Rest of CMP WNL CBC WNL Lipid panel WNL

## 2024-04-06 ENCOUNTER — Other Ambulatory Visit: Payer: Self-pay | Admitting: Physician Assistant

## 2024-04-06 DIAGNOSIS — M0579 Rheumatoid arthritis with rheumatoid factor of multiple sites without organ or systems involvement: Secondary | ICD-10-CM

## 2024-04-08 NOTE — Telephone Encounter (Signed)
 Last Fill: 01/04/2024  Eye exam: 05/15/2023 WNL   Labs: 03/05/2024 Total protein is low but stable. Rest of CMP WNL CBC WNL Lipid panel WNL  Next Visit: 06/05/2024  Last Visit: 01/04/2024  IK:Myzlfjunpi arthritis involving multiple sites with positive rheumatoid factor (HCC)   Current Dose per office note 01/04/2024: Plaquenil  200 mg 1 tablet by mouth twice daily Monday through Friday only.   Okay to refill Plaquenil ?

## 2024-04-27 ENCOUNTER — Other Ambulatory Visit: Payer: Self-pay | Admitting: Neurology

## 2024-04-27 DIAGNOSIS — G20B2 Parkinson's disease with dyskinesia, with fluctuations: Secondary | ICD-10-CM

## 2024-05-05 NOTE — Progress Notes (Unsigned)
 "   Assessment/Plan:   1.  Parkinsons Disease with Parkinsons Disease dyskinesia  -continue carbidopa /levodopa  25/100, 1.5 tablet at 5am and 9 AM/ and then 1 tablet at 1 PM/5 PM.   -Continue carbidopa /levodopa  50/200 CR at bedtime  -Continue amantadine  100 mg, 1 at 9 AM and 1 PM  -Follows with dermatology.  2.  Insomnia  -Likely related to nocturia.  May need to see urology or urogynecology in the future.  3.  REM behavior disorder  -This is commonly associated with PD and the patient is experiencing this.  We discussed that this can be very serious and even harmful.  We talked about medications as well as physical barriers to put in the bed (particularly soft bed rails, pillow barriers).  We talked about moving the night stand so that it is not so close to the side of the bed.   She notes states that she is doing fairly well in this regard.  4.  Chronic pain  -on oxycodone    Subjective:   Dawn Harrell was seen today in follow up for Parkinsons disease.  My previous records were reviewed prior to todays visit as well as outside records available to me. Pt with sister who supplements hx.   last visit, I slightly increased the patient's levodopa .  Patient tolerated that well, without side effects.  I also encouraged her to use a regular/traditional walker instead of the rollator, as she was having festination and falls with the rollator.  She continues to use the rollator but is using the regular one at home.  She has had no falls.  No syncope.  No hallucinations.  Denies issues with RBD at this time.  Current prescribed movement disorder medications:  Carbidopa /levodopa  25/100, 1.5 tablet at 5am and 9 AM/ and then 1 tablet at 1 PM/5 PM Carbidopa /levodopa  50/200 CR at bedtime  Amantadine , 1 tablet at 9 AM and 1 PM   ALLERGIES:   Allergies  Allergen Reactions   Hydrocodone-Acetaminophen Shortness Of Breath   Meloxicam Nausea And Vomiting and Nausea Only    Other Reaction(s): GI  intolerance   Prednisone Other (See Comments)    Other Reaction(s): Not available, Other (See Comments)   Gabapentin     Swelling     CURRENT MEDICATIONS:  Current Meds  Medication Sig   acetaminophen (TYLENOL) 500 MG tablet Take 500 mg by mouth as needed.   alendronate (FOSAMAX) 70 MG tablet Take 70 mg by mouth once a week.   amantadine  (SYMMETREL ) 100 MG capsule Take 1 capsule (100 mg total) by mouth 2 (two) times daily.   amLODipine (NORVASC) 2.5 MG tablet Take by mouth.   ASPIRIN 81 PO Take by mouth daily.   CALCIUM PO Take 1,200 mg by mouth daily. +vitamin D   carbidopa -levodopa  (SINEMET  CR) 50-200 MG tablet Take 1 tablet by mouth at bedtime.   carbidopa -levodopa  (SINEMET  IR) 25-100 MG tablet TAKE 1 AND 1/2 tabletS BY MOUTH at 5am and 9 AM and then 1 tablet at 1 PM AND 5 PM.   carvedilol (COREG) 6.25 MG tablet Take 6.25 mg by mouth 2 (two) times daily.   Cholecalciferol (D-3-5) 125 MCG (5000 UT) capsule Take by mouth.   Coenzyme Q10 (CO Q 10 PO) Take 200 mg by mouth daily.   folic acid  (FOLVITE ) 400 MCG tablet Take by mouth.   hydroxychloroquine  (PLAQUENIL ) 200 MG tablet TAKE 1 TABLET by mouth TWICE DAILY MONDAY THROUGH FRIDAY   methocarbamol (ROBAXIN) 500 MG tablet Take 500  mg by mouth daily.   Multiple Vitamins-Minerals (MULTIVITAMIN WITH MINERALS) tablet every morning before breakfast.   oxyCODONE (OXY IR/ROXICODONE) 5 MG immediate release tablet Take 5 mg by mouth as needed.   rosuvastatin (CRESTOR) 5 MG tablet Take 5 mg by mouth daily.   vitamin B-12 (CYANOCOBALAMIN ) 100 MCG tablet      Objective:   PHYSICAL EXAMINATION:    VITALS:   Vitals:   05/07/24 0907  BP: 124/64  Pulse: (!) 56  SpO2: 99%  Weight: 125 lb 12.8 oz (57.1 kg)  Height: 5' 1 (1.549 m)     GEN:  The patient appears stated age and is in NAD. HEENT:  Normocephalic, atraumatic.  The mucous membranes are moist. The superficial temporal arteries are without ropiness or tenderness. CV: Bradycardic.   Regular. Lungs:  CTAB Neck/HEME:  There are no carotid bruits bilaterally.  Neurological examination:  Orientation: The patient is alert and oriented x3. Cranial nerves: There is good facial symmetry with mild facial hypomimia. The speech is fluent and clear. Soft palate rises symmetrically and there is no tongue deviation. Hearing is intact to conversational tone. Sensation: Sensation is intact to light touch throughout Motor: Strength is at least antigravity x4.  Movement examination: Tone: There is nl tone in the UE bilaterally Abnormal movements: There is RUE rest tremor, intermittent and jerky in quality; rare tremor of the L foot Coordination:  There is no significant decremation today. Gait and Station: The patient pushes off to arise.  Ambulates well with her walker.  stable  I have reviewed and interpreted the following labs independently    Chemistry      Component Value Date/Time   NA 136 03/05/2024 1050   K 4.3 03/05/2024 1050   CL 98 03/05/2024 1050   CO2 27 03/05/2024 1050   BUN 17 03/05/2024 1050   CREATININE 0.83 03/05/2024 1050   CREATININE 0.67 07/31/2023 0917      Component Value Date/Time   CALCIUM 9.9 03/05/2024 1050   ALKPHOS 86 03/05/2024 1050   AST 26 03/05/2024 1050   ALT 8 03/05/2024 1050   BILITOT 0.4 03/05/2024 1050       Lab Results  Component Value Date   WBC 5.9 03/05/2024   HGB 13.8 03/05/2024   HCT 42.6 03/05/2024   MCV 89 03/05/2024   PLT 255 03/05/2024    No results found for: TSH   Cc:  Moon, Amy A, NP "

## 2024-05-07 ENCOUNTER — Ambulatory Visit (INDEPENDENT_AMBULATORY_CARE_PROVIDER_SITE_OTHER): Admitting: Neurology

## 2024-05-07 ENCOUNTER — Encounter: Payer: Self-pay | Admitting: Neurology

## 2024-05-07 VITALS — BP 124/64 | HR 56 | Ht 61.0 in | Wt 125.8 lb

## 2024-05-07 DIAGNOSIS — G20B2 Parkinson's disease with dyskinesia, with fluctuations: Secondary | ICD-10-CM

## 2024-05-07 DIAGNOSIS — G4752 REM sleep behavior disorder: Secondary | ICD-10-CM | POA: Diagnosis not present

## 2024-05-07 MED ORDER — CARBIDOPA-LEVODOPA ER 50-200 MG PO TBCR: 1.0000 | EXTENDED_RELEASE_TABLET | Freq: Every day | ORAL | 1 refills | Status: AC

## 2024-05-07 NOTE — Patient Instructions (Signed)
 Here are some resources/books that you may find helpful as you navigate the challenges of Parkinson's Disease  1.  Parkinson's treatement: 10 secrets to a happier life by Jefferey Pica, MD 2.  Navigating Life with Parkinsons disease by Sotirios Parashos 3.  My degeneration: A journey through Parkinsons Ledora Bottcher - Shohl) 4.  Every Victory counts (I believe this one if free through [employer]) 5.  Lucky Man by Gardner Candle 6.  101 Questions & Answers about Parkinson's by Caprice Renshaw 7.  Parkinsons Disease Treatment Book by JE Ahiskog

## 2024-05-22 NOTE — Progress Notes (Unsigned)
 "  Office Visit Note  Patient: Dawn Harrell             Date of Birth: 06-Jan-1946           MRN: [id number]             PCP: Erick Greig LABOR, NP Referring: Erick Greig LABOR, NP Visit Date: 06/05/2024 Occupation: Data Unavailable  Subjective:  No chief complaint on file.   History of Present Illness: Dawn Harrell is a 79 y.o. female ***     Activities of Daily Living:  Patient reports morning stiffness for *** {minute/hour:19697}.   Patient {ACTIONS;DENIES/REPORTS:21021675::Denies} nocturnal pain.  Difficulty dressing/grooming: {ACTIONS;DENIES/REPORTS:21021675::Denies} Difficulty climbing stairs: {ACTIONS;DENIES/REPORTS:21021675::Denies} Difficulty getting out of chair: {ACTIONS;DENIES/REPORTS:21021675::Denies} Difficulty using hands for taps, buttons, cutlery, and/or writing: {ACTIONS;DENIES/REPORTS:21021675::Denies}  No Rheumatology ROS completed.   PMFS History:  Patient Active Problem List   Diagnosis Date Noted   Parkinson's disease (HCC) 02/22/2021   Chondromalacia of both patellae 10/04/2016   History of tremor/ Parkinsons dx.  10/04/2016   History of TIA (transient ischemic attack) 10/04/2016   History of hyperlipidemia 10/04/2016   Tremor 03/24/2016   Rheumatoid arthritis involving multiple sites with positive rheumatoid factor (HCC) 03/24/2016   Primary osteoarthritis of both hands 03/24/2016   Primary osteoarthritis of both knees 03/24/2016   Primary osteoarthritis of both feet 03/24/2016   High risk medication use 03/24/2016    Past Medical History:  Diagnosis Date   HTN (hypertension)    Parkinson's disease (HCC)    rheumatoid arthritis    skin cancer     Family History  Problem Relation Age of Onset   Stroke Mother    Heart attack Father    Rheum arthritis Sister    Leukemia Sister    Lung disease Brother    Dementia Brother    Diabetes Maternal Grandmother    Diabetes Paternal Grandmother    Past Surgical History:  Procedure Laterality  Date   ABDOMINAL HYSTERECTOMY     bladder tack     EYE SURGERY Left 11/09/2021   FOOT SURGERY     OVARIAN CYST REMOVAL     SKIN CANCER EXCISION  11/2015   SQUAMOUS CELL CARCINOMA EXCISION  03/2018   right arm   Social History[1] Social History   Social History Narrative   Right handed    Lives with son    Currently retired     Immunization History  Administered Date(s) Administered   Moderna Sars-Covid-2 Vaccination 05/23/2019, 06/20/2019     Objective: Vital Signs: There were no vitals taken for this visit.   Physical Exam   Musculoskeletal Exam: ***  CDAI Exam: CDAI Score: -- Patient Global: --; Provider Global: -- Swollen: --; Tender: -- Joint Exam 06/05/2024   No joint exam has been documented for this visit   There is currently no information documented on the homunculus. Go to the Rheumatology activity and complete the homunculus joint exam.  Investigation: No additional findings.  Imaging: No results found.  Recent Labs: Lab Results  Component Value Date   WBC 5.9 03/05/2024   HGB 13.8 03/05/2024   PLT 255 03/05/2024   NA 136 03/05/2024   K 4.3 03/05/2024   CL 98 03/05/2024   CO2 27 03/05/2024   GLUCOSE 85 03/05/2024   BUN 17 03/05/2024   CREATININE 0.83 03/05/2024   BILITOT 0.4 03/05/2024   ALKPHOS 86 03/05/2024   AST 26 03/05/2024   ALT 8 03/05/2024   PROT 5.7 (L) 03/05/2024   ALBUMIN  4.0 03/05/2024   CALCIUM 9.9 03/05/2024   GFRAA 90 05/21/2020   QFTBGOLDPLUS NEGATIVE 01/23/2018    Speciality Comments: PLQ Eye Exam: 05/15/2024 WNL @ [facility] [place] in 1 year  Immunosupressant Lab Work:  TB gold-negative 09/04/13  Hepatitis panel- negative except Hep A IgM antibody 09/04/13  Procedures:  No procedures performed Allergies: Hydrocodone-acetaminophen, Meloxicam, Prednisone, and Gabapentin   Assessment / Plan:     Visit Diagnoses: Rheumatoid arthritis involving multiple sites with positive rheumatoid factor (HCC)  High  risk medication use  History of hyperlipidemia  Primary osteoarthritis of both hands  Primary osteoarthritis of both knees  Chondromalacia of both patellae  Primary osteoarthritis of both feet  History of tremor/ Parkinsons dx.   History of TIA (transient ischemic attack)  Orders: No orders of the defined types were placed in this encounter.  No orders of the defined types were placed in this encounter.   Face-to-face time spent with patient was *** minutes. Greater than 50% of time was spent in counseling and coordination of care.  Follow-Up Instructions: No follow-ups on file.   Waddell CHRISTELLA Craze, PA-C  Note - This record has been created using Dragon software.  Chart creation errors have been sought, but may not always  have been located. Such creation errors do not reflect on  the standard of medical care.     [1]  Social History Tobacco Use   Smoking status: Never    Passive exposure: Past   Smokeless tobacco: Never  Vaping Use   Vaping status: Never Used  Substance Use Topics   Alcohol use: No   Drug use: No   "

## 2024-06-05 ENCOUNTER — Ambulatory Visit: Admitting: Physician Assistant

## 2024-06-05 DIAGNOSIS — M19041 Primary osteoarthritis, right hand: Secondary | ICD-10-CM

## 2024-06-05 DIAGNOSIS — Z8673 Personal history of transient ischemic attack (TIA), and cerebral infarction without residual deficits: Secondary | ICD-10-CM

## 2024-06-05 DIAGNOSIS — M170 Bilateral primary osteoarthritis of knee: Secondary | ICD-10-CM

## 2024-06-05 DIAGNOSIS — M19072 Primary osteoarthritis, left ankle and foot: Secondary | ICD-10-CM

## 2024-06-05 DIAGNOSIS — Z8639 Personal history of other endocrine, nutritional and metabolic disease: Secondary | ICD-10-CM

## 2024-06-05 DIAGNOSIS — Z79899 Other long term (current) drug therapy: Secondary | ICD-10-CM

## 2024-06-05 DIAGNOSIS — Z8669 Personal history of other diseases of the nervous system and sense organs: Secondary | ICD-10-CM

## 2024-06-05 DIAGNOSIS — M2241 Chondromalacia patellae, right knee: Secondary | ICD-10-CM

## 2024-06-05 DIAGNOSIS — M0579 Rheumatoid arthritis with rheumatoid factor of multiple sites without organ or systems involvement: Secondary | ICD-10-CM

## 2024-11-07 ENCOUNTER — Ambulatory Visit: Payer: Self-pay | Admitting: Neurology
